# Patient Record
Sex: Female | Born: 1950 | Race: White | Hispanic: No | Marital: Married | Smoking: Never smoker
Health system: Southern US, Community
[De-identification: ages and names within clinical notes are randomized; demographics above are authoritative.]

## PROBLEM LIST (undated history)

## (undated) DIAGNOSIS — D649 Anemia, unspecified: Secondary | ICD-10-CM

## (undated) DIAGNOSIS — M797 Fibromyalgia: Secondary | ICD-10-CM

## (undated) DIAGNOSIS — K219 Gastro-esophageal reflux disease without esophagitis: Secondary | ICD-10-CM

## (undated) DIAGNOSIS — F329 Major depressive disorder, single episode, unspecified: Secondary | ICD-10-CM

## (undated) DIAGNOSIS — K589 Irritable bowel syndrome without diarrhea: Secondary | ICD-10-CM

## (undated) DIAGNOSIS — M8580 Other specified disorders of bone density and structure, unspecified site: Secondary | ICD-10-CM

## (undated) DIAGNOSIS — R51 Headache: Secondary | ICD-10-CM

## (undated) DIAGNOSIS — T7840XA Allergy, unspecified, initial encounter: Secondary | ICD-10-CM

## (undated) DIAGNOSIS — R519 Headache, unspecified: Secondary | ICD-10-CM

## (undated) DIAGNOSIS — G8929 Other chronic pain: Secondary | ICD-10-CM

## (undated) DIAGNOSIS — J329 Chronic sinusitis, unspecified: Secondary | ICD-10-CM

## (undated) DIAGNOSIS — G709 Myoneural disorder, unspecified: Secondary | ICD-10-CM

## (undated) DIAGNOSIS — Q430 Meckel's diverticulum (displaced) (hypertrophic): Secondary | ICD-10-CM

## (undated) DIAGNOSIS — F32A Depression, unspecified: Secondary | ICD-10-CM

## (undated) DIAGNOSIS — F419 Anxiety disorder, unspecified: Secondary | ICD-10-CM

## (undated) HISTORY — PX: BREAST ENHANCEMENT SURGERY: SHX7

## (undated) HISTORY — DX: Anemia, unspecified: D64.9

## (undated) HISTORY — DX: Major depressive disorder, single episode, unspecified: F32.9

## (undated) HISTORY — PX: HEMORRHOID BANDING: SHX5850

## (undated) HISTORY — DX: Gastro-esophageal reflux disease without esophagitis: K21.9

## (undated) HISTORY — DX: Other specified disorders of bone density and structure, unspecified site: M85.80

## (undated) HISTORY — DX: Myoneural disorder, unspecified: G70.9

## (undated) HISTORY — DX: Irritable bowel syndrome, unspecified: K58.9

## (undated) HISTORY — DX: Meckel's diverticulum (displaced) (hypertrophic): Q43.0

## (undated) HISTORY — DX: Allergy, unspecified, initial encounter: T78.40XA

## (undated) HISTORY — PX: LAPAROSCOPIC ABDOMINAL EXPLORATION: SHX6249

## (undated) HISTORY — DX: Fibromyalgia: M79.7

## (undated) HISTORY — DX: Other chronic pain: G89.29

## (undated) HISTORY — DX: Anxiety disorder, unspecified: F41.9

## (undated) HISTORY — DX: Headache: R51

## (undated) HISTORY — PX: ABDOMINAL HYSTERECTOMY: SHX81

## (undated) HISTORY — DX: Headache, unspecified: R51.9

## (undated) HISTORY — DX: Chronic sinusitis, unspecified: J32.9

## (undated) HISTORY — DX: Depression, unspecified: F32.A

---

## 1995-06-16 DIAGNOSIS — Q430 Meckel's diverticulum (displaced) (hypertrophic): Secondary | ICD-10-CM

## 1995-06-16 HISTORY — PX: APPENDECTOMY: SHX54

## 1995-06-16 HISTORY — DX: Meckel's diverticulum (displaced) (hypertrophic): Q43.0

## 1998-03-13 ENCOUNTER — Emergency Department (HOSPITAL_COMMUNITY): Admission: EM | Admit: 1998-03-13 | Discharge: 1998-03-13 | Payer: Self-pay | Admitting: Emergency Medicine

## 1998-03-14 ENCOUNTER — Emergency Department (HOSPITAL_COMMUNITY): Admission: EM | Admit: 1998-03-14 | Discharge: 1998-03-15 | Payer: Self-pay | Admitting: Emergency Medicine

## 1998-04-10 ENCOUNTER — Encounter: Payer: Self-pay | Admitting: Internal Medicine

## 1998-04-10 ENCOUNTER — Ambulatory Visit (HOSPITAL_COMMUNITY): Admission: RE | Admit: 1998-04-10 | Discharge: 1998-04-10 | Payer: Self-pay | Admitting: Internal Medicine

## 1999-03-17 ENCOUNTER — Other Ambulatory Visit: Admission: RE | Admit: 1999-03-17 | Discharge: 1999-03-17 | Payer: Self-pay | Admitting: Gynecology

## 2000-04-14 ENCOUNTER — Other Ambulatory Visit: Admission: RE | Admit: 2000-04-14 | Discharge: 2000-04-14 | Payer: Self-pay | Admitting: Gynecology

## 2000-10-04 ENCOUNTER — Ambulatory Visit (HOSPITAL_COMMUNITY): Admission: RE | Admit: 2000-10-04 | Discharge: 2000-10-04 | Payer: Self-pay | Admitting: Internal Medicine

## 2000-10-04 ENCOUNTER — Encounter: Payer: Self-pay | Admitting: Internal Medicine

## 2001-04-28 ENCOUNTER — Other Ambulatory Visit: Admission: RE | Admit: 2001-04-28 | Discharge: 2001-04-28 | Payer: Self-pay | Admitting: Gynecology

## 2002-05-04 ENCOUNTER — Other Ambulatory Visit: Admission: RE | Admit: 2002-05-04 | Discharge: 2002-05-04 | Payer: Self-pay | Admitting: Gynecology

## 2002-10-31 ENCOUNTER — Encounter: Payer: Self-pay | Admitting: Internal Medicine

## 2002-10-31 ENCOUNTER — Ambulatory Visit (HOSPITAL_COMMUNITY): Admission: RE | Admit: 2002-10-31 | Discharge: 2002-10-31 | Payer: Self-pay | Admitting: Internal Medicine

## 2003-05-28 ENCOUNTER — Other Ambulatory Visit: Admission: RE | Admit: 2003-05-28 | Discharge: 2003-05-28 | Payer: Self-pay | Admitting: Gynecology

## 2004-08-11 ENCOUNTER — Other Ambulatory Visit: Admission: RE | Admit: 2004-08-11 | Discharge: 2004-08-11 | Payer: Self-pay | Admitting: Gynecology

## 2005-03-11 ENCOUNTER — Ambulatory Visit: Payer: Self-pay | Admitting: Internal Medicine

## 2005-03-27 ENCOUNTER — Ambulatory Visit: Payer: Self-pay | Admitting: Internal Medicine

## 2005-08-11 ENCOUNTER — Other Ambulatory Visit: Admission: RE | Admit: 2005-08-11 | Discharge: 2005-08-11 | Payer: Self-pay | Admitting: Gynecology

## 2006-09-18 ENCOUNTER — Ambulatory Visit (HOSPITAL_COMMUNITY): Admission: RE | Admit: 2006-09-18 | Discharge: 2006-09-18 | Payer: Self-pay | Admitting: Internal Medicine

## 2006-10-31 ENCOUNTER — Emergency Department (HOSPITAL_COMMUNITY): Admission: EM | Admit: 2006-10-31 | Discharge: 2006-10-31 | Payer: Self-pay | Admitting: Emergency Medicine

## 2006-11-03 ENCOUNTER — Encounter (HOSPITAL_COMMUNITY): Admission: RE | Admit: 2006-11-03 | Discharge: 2007-01-24 | Payer: Self-pay | Admitting: Emergency Medicine

## 2006-11-07 ENCOUNTER — Emergency Department (HOSPITAL_COMMUNITY): Admission: EM | Admit: 2006-11-07 | Discharge: 2006-11-07 | Payer: Self-pay | Admitting: Emergency Medicine

## 2006-11-14 ENCOUNTER — Emergency Department (HOSPITAL_COMMUNITY): Admission: EM | Admit: 2006-11-14 | Discharge: 2006-11-14 | Payer: Self-pay | Admitting: Emergency Medicine

## 2006-11-21 ENCOUNTER — Emergency Department (HOSPITAL_COMMUNITY): Admission: EM | Admit: 2006-11-21 | Discharge: 2006-11-21 | Payer: Self-pay | Admitting: Emergency Medicine

## 2007-03-24 ENCOUNTER — Other Ambulatory Visit: Admission: RE | Admit: 2007-03-24 | Discharge: 2007-03-24 | Payer: Self-pay | Admitting: Gynecology

## 2007-12-07 ENCOUNTER — Ambulatory Visit (HOSPITAL_COMMUNITY): Admission: RE | Admit: 2007-12-07 | Discharge: 2007-12-07 | Payer: Self-pay | Admitting: Internal Medicine

## 2008-01-04 ENCOUNTER — Ambulatory Visit: Payer: Self-pay

## 2008-07-04 ENCOUNTER — Other Ambulatory Visit: Admission: RE | Admit: 2008-07-04 | Discharge: 2008-07-04 | Payer: Self-pay | Admitting: Gynecology

## 2009-05-02 ENCOUNTER — Encounter: Admission: RE | Admit: 2009-05-02 | Discharge: 2009-05-02 | Payer: Self-pay | Admitting: Internal Medicine

## 2009-05-23 ENCOUNTER — Ambulatory Visit (HOSPITAL_COMMUNITY): Admission: RE | Admit: 2009-05-23 | Discharge: 2009-05-23 | Payer: Self-pay | Admitting: Internal Medicine

## 2009-11-25 ENCOUNTER — Encounter: Payer: Self-pay | Admitting: Cardiology

## 2009-11-26 ENCOUNTER — Encounter: Payer: Self-pay | Admitting: Cardiology

## 2010-04-01 ENCOUNTER — Ambulatory Visit: Payer: Self-pay | Admitting: Cardiology

## 2010-04-01 ENCOUNTER — Encounter: Payer: Self-pay | Admitting: Cardiology

## 2010-04-01 DIAGNOSIS — R002 Palpitations: Secondary | ICD-10-CM

## 2010-05-02 ENCOUNTER — Ambulatory Visit: Payer: Self-pay | Admitting: Cardiology

## 2010-07-15 NOTE — Assessment & Plan Note (Signed)
Summary: np6/ palp/ pt has uch/.gd   Visit Type:  Follow-up Primary Provider:  Dr. Marlowe Shores  CC:  Chest pain/Palpitations.  History of Present Illness: The patient presents evaluation of chest discomfort. This has been sporadic. She finds it difficult to quantify or qualify. Seems to happen at rest. It seems to be in mild discomfort. There is no associated jaw or arm discomfort or associated symptoms. She cannot bring on with walking. She also gets palpitations. These she described as skipped heartbeats or a tightness in her throat. This again happened sporadically. Seems to be less if she is taking Xanax. She has had some lightheadedness with them but no syncope. She does have some shortness of breath but has seasonal allergies and wheezing. She also reports to being under significant emotional stress. This seems to calm her symptoms. She has had a stress test for 5 years ago and described a perfusion study that apparently was normal. She's had no other cardiac history  Current Medications (verified): 1)  Savella 50 Mg Tabs (Milnacipran Hcl) .Marland Kitchen.. 1 By Mouth Two Times A Day 2)  Amitiza 24 Mcg Caps (Lubiprostone) .Marland Kitchen.. 1 By Mouth Two Times A Day 3)  Hydrochlorothiazide 25 Mg Tabs (Hydrochlorothiazide) .Marland Kitchen.. 1 By Mouth As Needed 4)  Aspirin 81 Mg  Tabs (Aspirin) .Marland Kitchen.. 1 By Mouth Daily 5)  Singulair 10 Mg Tabs (Montelukast Sodium) .Marland Kitchen.. 1 By Mouth Daily 6)  Docusate Sodium 100 Mg Caps (Docusate Sodium) .... As Needed 7)  Zantac 150 Mg Tabs (Ranitidine Hcl) .Marland Kitchen.. 1 By Mouth Two Times A Day 8)  Vitamin B Complex  Tabs (B Complex Vitamins) .Marland Kitchen.. 1 By Mouth Daily 9)  Vitamin D3 5000 Unit Tabs (Cholecalciferol) .Marland Kitchen.. 1 By Mouth Daily 10)  Magnesium 250 Mg Tabs (Magnesium) .Marland Kitchen.. 1 By Mouth  Two Times A Day 11)  Fish Oil   Oil (Fish Oil) .... 2 By Mouth Daily 12)  Relpax 40 Mg Tabs (Eletriptan Hydrobromide) .Marland Kitchen.. 1 By Mouth As Needed 13)  Zofran 8 Mg Tabs (Ondansetron Hcl) .... As Needed 14)  Vivelle-Dot  0.075 Mg/24hr Pttw (Estradiol) .... As Directed 15)  Advair Diskus 250-50 Mcg/dose Aepb (Fluticasone-Salmeterol) .... Two Times A Day As Needed 16)  Prednisone 5 Mg Tabs (Prednisone) .... As Needed 17)  Alprazolam 0.25 Mg Tabs (Alprazolam) .... 1/2 As Needed 18)  Aleve 220 Mg Tabs (Naproxen Sodium) .... As Needed 19)  Oxvert 12.5mg  .... 1 By Mouth As Needed 20)  Omraris .... As Needed  Allergies (verified): 1)  ! * Codiene 2)  ! Macrobid 3)  ! Vicodin  Past History:  Past Medical History: Seasonal allergies, depression, migraine headache   Past Surgical History: Caesarean section, hysterectomy, laparoscopy, breast lift w/implants, paracentesis   Review of Systems       As stated in the HPI and negative for all other systems.   Vital Signs:  Patient profile:   60 year old female Height:      60 inches Weight:      117 pounds BMI:     22.93 Pulse rate:   90 / minute Resp:     16 per minute BP sitting:   145 / 89  (right arm)  Vitals Entered By: Marrion Coy, CNA (April 01, 2010 9:05 AM)  Physical Exam  General:  Well developed, well nourished, in no acute distress. Head:  normocephalic and atraumatic Eyes:  PERRLA/EOM intact; conjunctiva and lids normal. Mouth:  Teeth, gums and palate normal. Oral mucosa normal. Neck:  Neck supple, no JVD. No masses, thyromegaly or abnormal cervical nodes. Chest Wall:  no deformities or breast masses noted Lungs:  Clear bilaterally to auscultation and percussion. Abdomen:  Bowel sounds positive; abdomen soft and non-tender without masses, organomegaly, or hernias noted. No hepatosplenomegaly. Msk:  Back normal, normal gait. Muscle strength and tone normal. Extremities:  No clubbing or cyanosis. Neurologic:  Alert and oriented x 3. Skin:  Intact without lesions or rashes. Cervical Nodes:  no significant adenopathy Axillary Nodes:  no significant adenopathy Inguinal Nodes:  no significant adenopathy Psych:  Normal  affect.   Detailed Cardiovascular Exam  Neck    Carotids: Carotids full and equal bilaterally without bruits.      Neck Veins: Normal, no JVD.    Heart    Inspection: no deformities or lifts noted.      Palpation: normal PMI with no thrills palpable.      Auscultation: regular rate and rhythm, S1, S2 without murmurs, rubs, gallops, or clicks.    Vascular    Abdominal Aorta: no palpable masses, pulsations, or audible bruits.      Femoral Pulses: normal femoral pulses bilaterally.      Pedal Pulses: normal pedal pulses bilaterally.      Radial Pulses: normal radial pulses bilaterally.      Peripheral Circulation: no clubbing, cyanosis, or edema noted with normal capillary refill.     Impression & Recommendations:  Problem # 1:  PALPITATIONS (ICD-785.1) Patient has symptoms as above. However, I strongly believe these to be stress related and infrequent enough that Holter monitoring wouldn't be helpful. She is going to pursue stress management first. I will check to make sure that there have been thyroid studies, potassium and magnesium. If the palpitations get worse we could consider further evaluation. Orders: Treadmill (Treadmill)  Problem # 2:  PRECORDIAL PAIN (QIO-962.95) Date the pretest probability of obstructive coronary disease is low. Exercise treadmill testing is indicated in this situation. Orders: EKG w/ Interpretation (93000) Treadmill (Treadmill)  Problem # 3:  ANXIETY (ICD-300.00) I have encouraged her to take treatment as prescribed for this and to followup with her primary physician.  Patient Instructions: 1)  Your physician recommends that you schedule a follow-up appointment at the time of your treadmill 2)  Your physician recommends that you continue on your current medications as directed. Please refer to the Current Medication list given to you today. 3)  Your physician has requested that you have an exercise tolerance test.  For further information please  visit [email or web address]  Please also follow instruction sheet, as given.

## 2010-07-15 NOTE — Letter (Signed)
Summary: [facility]  [facility]   Imported By: Marylou Mccoy 05/07/2010 09:37:19  _____________________________________________________________________  External Attachment:    Type:   Image     Comment:   External Document

## 2011-02-26 ENCOUNTER — Emergency Department (HOSPITAL_BASED_OUTPATIENT_CLINIC_OR_DEPARTMENT_OTHER)
Admission: EM | Admit: 2011-02-26 | Discharge: 2011-02-26 | Disposition: A | Discharge status: Home / Self Care | Payer: 59 | Attending: Emergency Medicine | Admitting: Emergency Medicine

## 2011-02-26 ENCOUNTER — Encounter: Payer: Self-pay | Admitting: *Deleted

## 2011-02-26 DIAGNOSIS — W268XXA Contact with other sharp object(s), not elsewhere classified, initial encounter: Secondary | ICD-10-CM | POA: Insufficient documentation

## 2011-02-26 DIAGNOSIS — S61209A Unspecified open wound of unspecified finger without damage to nail, initial encounter: Secondary | ICD-10-CM | POA: Insufficient documentation

## 2011-02-26 DIAGNOSIS — S61012A Laceration without foreign body of left thumb without damage to nail, initial encounter: Secondary | ICD-10-CM

## 2011-02-26 DIAGNOSIS — Z79899 Other long term (current) drug therapy: Secondary | ICD-10-CM | POA: Insufficient documentation

## 2011-02-26 DIAGNOSIS — Y92009 Unspecified place in unspecified non-institutional (private) residence as the place of occurrence of the external cause: Secondary | ICD-10-CM | POA: Insufficient documentation

## 2011-02-26 DIAGNOSIS — J45909 Unspecified asthma, uncomplicated: Secondary | ICD-10-CM | POA: Insufficient documentation

## 2011-02-26 MED ORDER — LIDOCAINE HCL (PF) 1 % IJ SOLN
5.0000 mL | Freq: Once | INTRAMUSCULAR | Status: AC
  Administered 2011-02-26: 5 mL

## 2011-02-26 MED ORDER — LIDOCAINE HCL (PF) 1 % IJ SOLN
INTRAMUSCULAR | Status: AC
  Filled 2011-02-26: qty 5

## 2011-02-26 MED ORDER — TETANUS-DIPHTH-ACELL PERTUSSIS 5-2.5-18.5 LF-MCG/0.5 IM SUSP
0.5000 mL | Freq: Once | INTRAMUSCULAR | Status: AC
  Administered 2011-02-26: 0.5 mL via INTRAMUSCULAR
  Filled 2011-02-26: qty 0.5

## 2011-02-26 NOTE — ED Notes (Signed)
Pt cut her left thumb against a metal can. Bleeding controlled. Unsure when last tetanus was given.

## 2011-02-26 NOTE — ED Provider Notes (Signed)
History     CSN: [id number] Arrival date & time: 02/26/2011  8:41 PM   Chief Complaint  Patient presents with  . Extremity Laceration     (Include location/radiation/quality/duration/timing/severity/associated sxs/prior treatment) Patient is a 60 y.o. female presenting with hand pain. The history is provided by the patient. No language interpreter was used.  Hand Pain This is a new problem. The current episode started today. The problem occurs constantly. The problem has been unchanged. The symptoms are aggravated by nothing. She has tried nothing for the symptoms. The treatment provided no relief.  Pt complains of a laceration to left thumb.  Pt cut thumb over a dog food can   Past Medical History  Diagnosis Date  . Asthma      Past Surgical History  Procedure Date  . Abdominal hysterectomy   . Cesarian   . Breast enhancement surgery     No family history on file.  History  Substance Use Topics  . Smoking status: Never Smoker   . Smokeless tobacco: Not on file  . Alcohol Use: No    OB History    Grav Para Term Preterm Abortions TAB SAB Ect Mult Living                  Review of Systems  Skin: Positive for wound.  All other systems reviewed and are negative.    Allergies  Hydrocodone-acetaminophen; Codeine; Morphine and related; and Nitrofurantoin  Home Medications   Current Outpatient Rx  Name Route Sig Dispense Refill  . ACETAMINOPHEN 500 MG PO TABS Oral Take 1,000 mg by mouth every 6 (six) hours as needed. For pain     . ALMOTRIPTAN MALATE 12.5 MG PO TABS Oral Take 12.5 mg by mouth once as needed. may repeat in 2 hours if needed     . ASPIRIN 81 MG PO TABS Oral Take 81 mg by mouth daily.      . CYCLOSPORINE 0.05 % OP EMUL Both Eyes Place 1 drop into both eyes 2 (two) times daily.      Marland Kitchen ESTRADIOL 0.05 MG/24HR TD PTTW Transdermal Place 1 patch onto the skin 2 (two) times a week.      Marland Kitchen FLUTICASONE-SALMETEROL 250-50 MCG/DOSE IN AEPB Inhalation Inhale 1  puff into the lungs 2 (two) times daily as needed. For shortness of breath     . HYDROCHLOROTHIAZIDE 25 MG PO TABS Oral Take 25 mg by mouth daily as needed. For fluid     . LEVOFLOXACIN 500 MG PO TABS Oral Take 500 mg by mouth daily.      . LUBIPROSTONE 24 MCG PO CAPS Oral Take 24 mcg by mouth 2 (two) times daily.      Marland Kitchen METRONIDAZOLE 1 % EX GEL Topical Apply 1 application topically 2 (two) times daily.      Marland Kitchen MILNACIPRAN HCL 100 MG PO TABS Oral Take 100 mg by mouth daily.      Marland Kitchen MONTELUKAST SODIUM 10 MG PO TABS Oral Take 10 mg by mouth daily.      Marland Kitchen RANITIDINE HCL 75 MG PO TABS Oral Take 75 mg by mouth 2 (two) times daily.      . ALBUTEROL 90 MCG/ACT IN AERS Inhalation Inhale 1 puff into the lungs every 6 (six) hours as needed. For shortness of breath       Physical Exam    BP 155/84  Pulse 88  Temp(Src) 97.8 F (36.6 C) (Oral)  Resp 20  SpO2 100%  Physical  Exam  Constitutional: She is oriented to person, place, and time. She appears well-developed and well-nourished.  HENT:  Head: Normocephalic.  Eyes: Pupils are equal, round, and reactive to light.  Musculoskeletal: She exhibits tenderness. She exhibits no edema.       1 cm laceration   Neurological: She is alert and oriented to person, place, and time.  Skin: Skin is warm.  Psychiatric: She has a normal mood and affect.    ED Course  LACERATION REPAIR Date/Time: 02/26/2011 10:36 PM Performed by: Langston Masker Authorized by: Emeline General A Risks and benefits: risks, benefits and alternatives were discussed Consent given by: patient Patient identity confirmed: verbally with patient Time out: Immediately prior to procedure a "time out" was called to verify the correct patient, procedure, equipment, support staff and site/side marked as required. Body area: upper extremity Location details: left thumb Laceration length: 0.7 cm Foreign bodies: metal Tendon involvement: none Nerve involvement: none Vascular damage:  no Anesthesia: local infiltration Local anesthetic: lidocaine 2% without epinephrine Patient sedated: no Skin closure: 5-0 Prolene Number of sutures: 2 Technique: simple Approximation: close Approximation difficulty: simple Patient tolerance: Patient tolerated the procedure well with no immediate complications.    No results found for this or any previous visit. No results found.   No diagnosis found.   MDM Pt advised suture removal in 8 days.       Langston Masker, [place] 02/26/11 2239  Langston Masker, [place] 02/26/11 2241

## 2011-03-02 NOTE — ED Provider Notes (Signed)
Medical screening examination/treatment/procedure(s) were performed by non-physician practitioner and as supervising physician I was immediately available for consultation/collaboration.   Dezyre Hoefer A Burgundy Matuszak, MD 03/02/11 0814 

## 2011-05-14 ENCOUNTER — Other Ambulatory Visit: Payer: Self-pay | Admitting: Internal Medicine

## 2011-05-14 DIAGNOSIS — R1011 Right upper quadrant pain: Secondary | ICD-10-CM

## 2011-05-19 ENCOUNTER — Ambulatory Visit
Admission: RE | Admit: 2011-05-19 | Discharge: 2011-05-19 | Disposition: A | Discharge status: Home / Self Care | Payer: 59 | Source: Ambulatory Visit | Attending: Internal Medicine | Admitting: Internal Medicine

## 2011-05-19 DIAGNOSIS — R1011 Right upper quadrant pain: Secondary | ICD-10-CM

## 2011-12-24 ENCOUNTER — Other Ambulatory Visit: Payer: Self-pay | Admitting: Internal Medicine

## 2011-12-24 ENCOUNTER — Ambulatory Visit
Admission: RE | Admit: 2011-12-24 | Discharge: 2011-12-24 | Disposition: A | Discharge status: Home / Self Care | Payer: 59 | Source: Ambulatory Visit | Attending: Internal Medicine | Admitting: Internal Medicine

## 2011-12-24 DIAGNOSIS — N200 Calculus of kidney: Secondary | ICD-10-CM

## 2012-03-11 ENCOUNTER — Other Ambulatory Visit (HOSPITAL_COMMUNITY): Payer: Self-pay | Admitting: Internal Medicine

## 2012-03-11 ENCOUNTER — Ambulatory Visit (HOSPITAL_COMMUNITY)
Admission: RE | Admit: 2012-03-11 | Discharge: 2012-03-11 | Disposition: A | Discharge status: Home / Self Care | Payer: 59 | Source: Ambulatory Visit | Attending: Internal Medicine | Admitting: Internal Medicine

## 2012-03-11 DIAGNOSIS — M549 Dorsalgia, unspecified: Secondary | ICD-10-CM | POA: Insufficient documentation

## 2012-03-11 DIAGNOSIS — R209 Unspecified disturbances of skin sensation: Secondary | ICD-10-CM | POA: Insufficient documentation

## 2012-03-11 DIAGNOSIS — R200 Anesthesia of skin: Secondary | ICD-10-CM

## 2013-03-07 LAB — HM MAMMOGRAPHY: HM Mammogram: NORMAL

## 2013-04-21 ENCOUNTER — Telehealth: Payer: Self-pay | Admitting: *Deleted

## 2013-04-21 ENCOUNTER — Other Ambulatory Visit: Payer: Self-pay | Admitting: Internal Medicine

## 2013-04-21 MED ORDER — ALPRAZOLAM 0.25 MG PO TABS: 0.2500 mg | ORAL_TABLET | Freq: Three times a day (TID) | ORAL | Status: DC | PRN

## 2013-04-21 NOTE — Telephone Encounter (Signed)
Refill fax request [facility] [address]  [place] [address]  [phone] ph [phone] fax    Xanax 0.25mg    #90

## 2013-05-03 ENCOUNTER — Encounter: Payer: Self-pay | Admitting: Internal Medicine

## 2013-05-03 DIAGNOSIS — J329 Chronic sinusitis, unspecified: Secondary | ICD-10-CM | POA: Insufficient documentation

## 2013-05-03 DIAGNOSIS — F419 Anxiety disorder, unspecified: Secondary | ICD-10-CM | POA: Insufficient documentation

## 2013-05-03 DIAGNOSIS — F324 Major depressive disorder, single episode, in partial remission: Secondary | ICD-10-CM | POA: Insufficient documentation

## 2013-05-03 DIAGNOSIS — J45909 Unspecified asthma, uncomplicated: Secondary | ICD-10-CM | POA: Insufficient documentation

## 2013-05-03 DIAGNOSIS — M8580 Other specified disorders of bone density and structure, unspecified site: Secondary | ICD-10-CM | POA: Insufficient documentation

## 2013-05-04 ENCOUNTER — Encounter: Payer: Self-pay | Admitting: Physician Assistant

## 2013-05-04 ENCOUNTER — Ambulatory Visit: Payer: 59 | Admitting: Physician Assistant

## 2013-05-04 VITALS — BP 128/70 | HR 84 | Temp 98.0°F | Resp 16 | Ht 60.0 in | Wt 109.0 lb

## 2013-05-04 DIAGNOSIS — J019 Acute sinusitis, unspecified: Secondary | ICD-10-CM

## 2013-05-04 MED ORDER — PREDNISONE 20 MG PO TABS: ORAL_TABLET | ORAL | Status: DC

## 2013-05-04 MED ORDER — AZITHROMYCIN 250 MG PO TABS: ORAL_TABLET | ORAL | Status: DC

## 2013-05-04 MED ORDER — AZELASTINE HCL 0.1 % NA SOLN: 2.0000 | Freq: Two times a day (BID) | NASAL | Status: DC

## 2013-05-04 NOTE — Progress Notes (Signed)
Subjective:    Patient ID: Alexis Weaver, female    DOB: [birth year], 62 y.o.   MRN: [id number]  Sinus Problem This is a new problem. Episode onset: 6 weeks worse for 3 weeks. The problem has been gradually worsening since onset. There has been no fever. The pain is mild. Associated symptoms include congestion, coughing, sinus pressure and a sore throat. Pertinent negatives include no chills, diaphoresis, headaches, neck pain, shortness of breath or sneezing.   In addition patient has fallen while on rocks on left hip. States it is better but has pain with lying on it. Patient also has right lateral elbow pain, chronic.   Current Outpatient Prescriptions on File Prior to Visit  Medication Sig Dispense Refill  . acetaminophen (TYLENOL) 500 MG tablet Take 1,000 mg by mouth every 6 (six) hours as needed. For pain       . ALPRAZolam (XANAX) 0.25 MG tablet Take 1 tablet (0.25 mg total) by mouth 3 (three) times daily as needed for anxiety.  90 tablet  5  . aspirin 81 MG tablet Take 81 mg by mouth daily.        . cycloSPORINE (RESTASIS) 0.05 % ophthalmic emulsion Place 1 drop into both eyes 2 (two) times daily.        Marland Kitchen estradiol (VIVELLE-DOT) 0.05 MG/24HR Place 1 patch onto the skin 2 (two) times a week.        . Fluticasone-Salmeterol (ADVAIR) 250-50 MCG/DOSE AEPB Inhale 1 puff into the lungs 2 (two) times daily as needed. For shortness of breath       . hydrochlorothiazide (HYDRODIURIL) 25 MG tablet Take 25 mg by mouth daily as needed. For fluid       . lubiprostone (AMITIZA) 24 MCG capsule Take 24 mcg by mouth 2 (two) times daily.        . Milnacipran HCl (SAVELLA) 100 MG TABS Take 100 mg by mouth daily.        . montelukast (SINGULAIR) 10 MG tablet Take 10 mg by mouth daily.         No current facility-administered medications on file prior to visit.   Past Medical History  Diagnosis Date  . Asthma   . Chronic sinusitis   . Anxiety   . Depression   . Osteopenia     Review of Systems   Constitutional: Positive for fatigue. Negative for fever, chills, diaphoresis, activity change, appetite change and unexpected weight change.  HENT: Positive for congestion, postnasal drip, sinus pressure and sore throat. Negative for sneezing.   Eyes: Negative.   Respiratory: Positive for cough and chest tightness. Negative for apnea, choking, shortness of breath, wheezing and stridor.   Endocrine: Negative.   Genitourinary: Negative.   Musculoskeletal: Positive for arthralgias and joint swelling (right elbow). Negative for back pain, gait problem, myalgias, neck pain and neck stiffness.  Skin: Negative.   Neurological: Negative.  Negative for headaches.  Hematological: Negative.        Objective:   Physical Exam  Constitutional: She is oriented to person, place, and time. She appears well-developed and well-nourished.  HENT:  Head: Normocephalic and atraumatic.  Nose: Right sinus exhibits maxillary sinus tenderness. Left sinus exhibits maxillary sinus tenderness.  Eyes: Conjunctivae are normal. Pupils are equal, round, and reactive to light.  Neck: Normal range of motion. Neck supple.  Cardiovascular: Normal rate, regular rhythm and normal heart sounds.   Pulmonary/Chest: Effort normal and breath sounds normal. No respiratory distress. She has no wheezes. She has  no rales.  Abdominal: Soft. Bowel sounds are normal.  Musculoskeletal:       Right elbow: She exhibits swelling. She exhibits normal range of motion, no effusion, no deformity and no laceration. Tenderness found. Lateral epicondyle tenderness noted.       Lumbar back: She exhibits tenderness and pain. She exhibits normal range of motion, no bony tenderness, no swelling, no edema, no deformity, no laceration and no spasm.  Lymphadenopathy:    She has cervical adenopathy.  Neurological: She is alert and oriented to person, place, and time.  Skin: Skin is warm and dry.       Assessment & Plan:  1. Acute sinusitis,  unspecified - azithromycin (ZITHROMAX) 250 MG tablet; Two tablets day one, then one tablet daily next 4 days.  Dispense: 6 tablet; Refill: 1 - predniSONE (DELTASONE) 20 MG tablet; take one tablet two times daily with food for 3 days, take one tablet daily for 4 days.  Dispense: 10 tablet; Refill: 0 - azelastine (ASTELIN) 137 MCG/SPRAY nasal spray; Place 2 sprays into both nostrils 2 (two) times daily. Use in each nostril as directed  Dispense: 30 mL; Refill: 2  2. Elbow pain and hip pain Get on prednisone and if pain continues then follow in the office for possible injections/xray.

## 2013-05-25 ENCOUNTER — Telehealth: Payer: Self-pay

## 2013-05-25 ENCOUNTER — Other Ambulatory Visit: Payer: Self-pay | Admitting: Physician Assistant

## 2013-05-25 MED ORDER — LEVOFLOXACIN 500 MG PO TABS: 500.0000 mg | ORAL_TABLET | Freq: Every day | ORAL | Status: AC

## 2013-05-25 MED ORDER — FLUCONAZOLE 150 MG PO TABS: 150.0000 mg | ORAL_TABLET | Freq: Every day | ORAL | Status: DC

## 2013-05-25 NOTE — Telephone Encounter (Signed)
Patient aware new RX to pharmacy

## 2013-06-02 ENCOUNTER — Other Ambulatory Visit: Payer: Self-pay | Admitting: Internal Medicine

## 2013-06-05 DIAGNOSIS — Z0000 Encounter for general adult medical examination without abnormal findings: Secondary | ICD-10-CM

## 2013-06-06 ENCOUNTER — Other Ambulatory Visit: Payer: Self-pay | Admitting: Internal Medicine

## 2013-06-12 ENCOUNTER — Other Ambulatory Visit: Payer: Self-pay | Admitting: Internal Medicine

## 2013-07-24 ENCOUNTER — Other Ambulatory Visit: Payer: Self-pay | Admitting: Allergy

## 2013-07-24 ENCOUNTER — Ambulatory Visit
Admission: RE | Admit: 2013-07-24 | Discharge: 2013-07-24 | Disposition: A | Discharge status: Home / Self Care | Payer: 59 | Source: Ambulatory Visit | Attending: Allergy | Admitting: Allergy

## 2013-07-24 DIAGNOSIS — J45909 Unspecified asthma, uncomplicated: Secondary | ICD-10-CM

## 2013-07-24 DIAGNOSIS — J329 Chronic sinusitis, unspecified: Secondary | ICD-10-CM

## 2013-08-21 ENCOUNTER — Other Ambulatory Visit: Payer: Self-pay | Admitting: Emergency Medicine

## 2013-08-21 MED ORDER — MILNACIPRAN HCL 100 MG PO TABS: 100.0000 mg | ORAL_TABLET | Freq: Every day | ORAL | Status: DC

## 2013-09-05 ENCOUNTER — Other Ambulatory Visit: Payer: Self-pay | Admitting: Emergency Medicine

## 2013-09-05 MED ORDER — ONDANSETRON HCL 8 MG PO TABS: 8.0000 mg | ORAL_TABLET | Freq: Three times a day (TID) | ORAL | Status: DC | PRN

## 2013-11-19 ENCOUNTER — Other Ambulatory Visit: Payer: Self-pay | Admitting: Internal Medicine

## 2013-11-22 ENCOUNTER — Other Ambulatory Visit: Payer: Self-pay | Admitting: Physician Assistant

## 2013-11-22 ENCOUNTER — Other Ambulatory Visit: Payer: Self-pay | Admitting: Internal Medicine

## 2013-11-22 ENCOUNTER — Other Ambulatory Visit: Payer: Self-pay | Admitting: Emergency Medicine

## 2013-11-22 MED ORDER — ALPRAZOLAM 0.25 MG PO TABS: ORAL_TABLET | ORAL | Status: DC

## 2013-11-23 ENCOUNTER — Encounter: Payer: Self-pay | Admitting: Physician Assistant

## 2013-11-23 ENCOUNTER — Ambulatory Visit (INDEPENDENT_AMBULATORY_CARE_PROVIDER_SITE_OTHER): Payer: 59 | Admitting: Physician Assistant

## 2013-11-23 VITALS — BP 110/62 | HR 76 | Temp 98.1°F | Resp 16 | Wt 107.0 lb

## 2013-11-23 DIAGNOSIS — IMO0001 Reserved for inherently not codable concepts without codable children: Secondary | ICD-10-CM

## 2013-11-23 DIAGNOSIS — E538 Deficiency of other specified B group vitamins: Secondary | ICD-10-CM

## 2013-11-23 DIAGNOSIS — Z79899 Other long term (current) drug therapy: Secondary | ICD-10-CM

## 2013-11-23 DIAGNOSIS — R5383 Other fatigue: Secondary | ICD-10-CM

## 2013-11-23 DIAGNOSIS — N3000 Acute cystitis without hematuria: Secondary | ICD-10-CM

## 2013-11-23 DIAGNOSIS — R5381 Other malaise: Secondary | ICD-10-CM

## 2013-11-23 LAB — URINALYSIS, ROUTINE W REFLEX MICROSCOPIC
Bilirubin Urine: NEGATIVE
GLUCOSE, UA: NEGATIVE mg/dL
HGB URINE DIPSTICK: NEGATIVE
KETONES UR: NEGATIVE mg/dL
Leukocytes, UA: NEGATIVE
Nitrite: NEGATIVE
PH: 6.5 (ref 5.0–8.0)
PROTEIN: NEGATIVE mg/dL
Specific Gravity, Urine: 1.006 (ref 1.005–1.030)
Urobilinogen, UA: 0.2 mg/dL (ref 0.0–1.0)

## 2013-11-23 LAB — CBC WITH DIFFERENTIAL/PLATELET
BASOS PCT: 1 % (ref 0–1)
Basophils Absolute: 0 10*3/uL (ref 0.0–0.1)
EOS PCT: 0 % (ref 0–5)
Eosinophils Absolute: 0 10*3/uL (ref 0.0–0.7)
HEMATOCRIT: 37.8 % (ref 36.0–46.0)
Hemoglobin: 13.2 g/dL (ref 12.0–15.0)
Lymphocytes Relative: 32 % (ref 12–46)
Lymphs Abs: 1.5 10*3/uL (ref 0.7–4.0)
MCH: 31.1 pg (ref 26.0–34.0)
MCHC: 34.9 g/dL (ref 30.0–36.0)
MCV: 89.2 fL (ref 78.0–100.0)
MONO ABS: 0.5 10*3/uL (ref 0.1–1.0)
Monocytes Relative: 10 % (ref 3–12)
NEUTROS ABS: 2.7 10*3/uL (ref 1.7–7.7)
Neutrophils Relative %: 57 % (ref 43–77)
Platelets: 243 10*3/uL (ref 150–400)
RBC: 4.24 MIL/uL (ref 3.87–5.11)
RDW: 14.7 % (ref 11.5–15.5)
WBC: 4.8 10*3/uL (ref 4.0–10.5)

## 2013-11-23 LAB — MAGNESIUM: MAGNESIUM: 1.5 mg/dL (ref 1.5–2.5)

## 2013-11-23 LAB — CK: CK TOTAL: 80 U/L (ref 7–177)

## 2013-11-23 LAB — COMPREHENSIVE METABOLIC PANEL
ALK PHOS: 58 U/L (ref 39–117)
ALT: 27 U/L (ref 0–35)
AST: 31 U/L (ref 0–37)
Albumin: 4.1 g/dL (ref 3.5–5.2)
BUN: 11 mg/dL (ref 6–23)
CO2: 30 mEq/L (ref 19–32)
CREATININE: 0.74 mg/dL (ref 0.50–1.10)
Calcium: 9.1 mg/dL (ref 8.4–10.5)
Chloride: 98 mEq/L (ref 96–112)
Glucose, Bld: 94 mg/dL (ref 70–99)
POTASSIUM: 4 meq/L (ref 3.5–5.3)
Sodium: 136 mEq/L (ref 135–145)
Total Bilirubin: 0.3 mg/dL (ref 0.2–1.2)
Total Protein: 6.5 g/dL (ref 6.0–8.3)

## 2013-11-23 LAB — VITAMIN B12: Vitamin B-12: 505 pg/mL (ref 211–911)

## 2013-11-23 LAB — TSH: TSH: 2.565 u[IU]/mL (ref 0.350–4.500)

## 2013-11-23 LAB — SEDIMENTATION RATE: Sed Rate: 4 mm/hr (ref 0–22)

## 2013-11-23 MED ORDER — CIPROFLOXACIN HCL 250 MG PO TABS: 250.0000 mg | ORAL_TABLET | Freq: Two times a day (BID) | ORAL | Status: AC

## 2013-11-23 MED ORDER — SULFAMETHOXAZOLE-TMP DS 800-160 MG PO TABS: 1.0000 | ORAL_TABLET | Freq: Two times a day (BID) | ORAL | Status: DC

## 2013-11-23 MED ORDER — PREDNISONE 20 MG PO TABS: ORAL_TABLET | ORAL | Status: DC

## 2013-11-23 MED ORDER — AZITHROMYCIN 250 MG PO TABS: ORAL_TABLET | ORAL | Status: DC

## 2013-11-23 NOTE — Patient Instructions (Addendum)
If you are traveling you can take these medications to be more prepared. If you get chest pain, shortness of breath or abdominal pain please go to the hospital wherever you may be.   Ciprofloxacin is good for travelers diarrhea, you can take 2 pills a day for 7 days. Or it is also good for urinary tract infections, you can take 2 a day for 7 days.  Zpak- is good for sinus infections please finish as prescribed Phenergran is for nausea but it sedating so plan on eating and sleeping.   Levsin is good for nausea, diarrhea, or abdominal cramping- it can constipate you so don't take too much. This dissolves under your tongue.  Prednisone is good for joint pain or rashes or spider bites- you can take it as prescribed but if you are feeling better you can stop it early.   Urinary Tract Infection Urinary tract infections (UTIs) can develop anywhere along your urinary tract. Your urinary tract is your body's drainage system for removing wastes and extra water. Your urinary tract includes two kidneys, two ureters, a bladder, and a urethra. Your kidneys are a pair of bean-shaped organs. Each kidney is about the size of your fist. They are located below your ribs, one on each side of your spine. CAUSES Infections are caused by microbes, which are microscopic organisms, including fungi, viruses, and bacteria. These organisms are so small that they can only be seen through a microscope. Bacteria are the microbes that most commonly cause UTIs. SYMPTOMS  Symptoms of UTIs may vary by age and gender of the patient and by the location of the infection. Symptoms in young women typically include a frequent and intense urge to urinate and a painful, burning feeling in the bladder or urethra during urination. Older women and men are more likely to be tired, shaky, and weak and have muscle aches and abdominal pain. A fever may mean the infection is in your kidneys. Other symptoms of a kidney infection include pain in your  back or sides below the ribs, nausea, and vomiting. DIAGNOSIS To diagnose a UTI, your caregiver will ask you about your symptoms. Your caregiver also will ask to provide a urine sample. The urine sample will be tested for bacteria and white blood cells. White blood cells are made by your body to help fight infection. TREATMENT  Typically, UTIs can be treated with medication. Because most UTIs are caused by a bacterial infection, they usually can be treated with the use of antibiotics. The choice of antibiotic and length of treatment depend on your symptoms and the type of bacteria causing your infection. HOME CARE INSTRUCTIONS  If you were prescribed antibiotics, take them exactly as your caregiver instructs you. Finish the medication even if you feel better after you have only taken some of the medication.  Drink enough water and fluids to keep your urine clear or pale yellow.  Avoid caffeine, tea, and carbonated beverages. They tend to irritate your bladder.  Empty your bladder often. Avoid holding urine for long periods of time.  Empty your bladder before and after sexual intercourse.  After a bowel movement, women should cleanse from front to back. Use each tissue only once. SEEK MEDICAL CARE IF:   You have back pain.  You develop a fever.  Your symptoms do not begin to resolve within 3 days. SEEK IMMEDIATE MEDICAL CARE IF:   You have severe back pain or lower abdominal pain.  You develop chills.  You have nausea or  vomiting.  You have continued burning or discomfort with urination. MAKE SURE YOU:   Understand these instructions.  Will watch your condition.  Will get help right away if you are not doing well or get worse. Document Released: 03/11/2005 Document Revised: 12/01/2011 Document Reviewed: 07/10/2011 [facility] Patient Information 2014 [employer], [place].

## 2013-11-23 NOTE — Progress Notes (Signed)
   Subjective:    Patient ID: Alexis Weaver, female    DOB: 1951-06-01, 63 y.o.   MRN: [id number]  HPI 63 y.o. female with history of fibromyalgia presents with fatigue last 2 weeks, she has had a sore throat and started her allergy medications. She has had some urgency, lower back pain in the past 2-3 days. She has been having more aching in her legs.    Review of Systems  Constitutional: Positive for fatigue. Negative for fever, chills and appetite change.  HENT: Positive for postnasal drip, sinus pressure and sore throat. Negative for dental problem, drooling, ear discharge, ear pain, sneezing, tinnitus and trouble swallowing.   Respiratory: Negative.   Cardiovascular: Negative.   Gastrointestinal: Negative.   Genitourinary: Positive for dysuria, urgency and frequency.  Musculoskeletal: Positive for arthralgias, back pain and myalgias.  Skin: Negative.  Negative for pallor and rash.  Neurological: Negative.        Objective:   Physical Exam  Constitutional: She is oriented to person, place, and time. She appears well-developed and well-nourished.  HENT:  Head: Normocephalic and atraumatic.  Right Ear: External ear normal.  Left Ear: External ear normal.  Mouth/Throat: Oropharynx is clear and moist.  Eyes: Conjunctivae and EOM are normal. Pupils are equal, round, and reactive to light.  Neck: Normal range of motion. Neck supple. No thyromegaly present.  Cardiovascular: Normal rate, regular rhythm and normal heart sounds.  Exam reveals no gallop and no friction rub.   No murmur heard. Pulmonary/Chest: Effort normal and breath sounds normal. No respiratory distress. She has no wheezes.  Abdominal: Soft. Bowel sounds are normal. She exhibits no distension and no mass. There is no tenderness. There is no rebound and no guarding.  Musculoskeletal: Normal range of motion.  Lymphadenopathy:    She has no cervical adenopathy.  Neurological: She is alert and oriented to person,  place, and time. She displays normal reflexes. No cranial nerve deficit. Coordination normal.  Skin: Skin is warm and dry.  Psychiatric: She has a normal mood and affect.       Assessment & Plan:  1. Acute cystitis Bactrim DS BID for 7 days.  - Urinalysis, Routine w reflex microscopic - Urine culture  2. Myalgia and myositis - CK - Sedimentation rate  3. Fatigue - CBC with Differential - Comprehensive metabolic panel - TSH - Sedimentation rate  4. Encounter for long-term (current) use of other medications - Magnesium  5. B12 deficiency - Vitamin B12  Follow up at [facility] in August

## 2013-11-24 LAB — URINE CULTURE: Colony Count: 3000

## 2013-12-05 ENCOUNTER — Other Ambulatory Visit: Payer: Self-pay | Admitting: Emergency Medicine

## 2013-12-05 MED ORDER — LUBIPROSTONE 24 MCG PO CAPS: 24.0000 ug | ORAL_CAPSULE | Freq: Two times a day (BID) | ORAL | Status: DC

## 2013-12-19 ENCOUNTER — Encounter: Payer: Self-pay | Admitting: Emergency Medicine

## 2013-12-19 ENCOUNTER — Ambulatory Visit (INDEPENDENT_AMBULATORY_CARE_PROVIDER_SITE_OTHER): Payer: 59 | Admitting: Emergency Medicine

## 2013-12-19 VITALS — BP 120/66 | HR 96 | Temp 97.8°F | Resp 18 | Ht 60.0 in | Wt 107.0 lb

## 2013-12-19 DIAGNOSIS — G43909 Migraine, unspecified, not intractable, without status migrainosus: Secondary | ICD-10-CM

## 2013-12-19 DIAGNOSIS — N898 Other specified noninflammatory disorders of vagina: Secondary | ICD-10-CM

## 2013-12-19 DIAGNOSIS — M2550 Pain in unspecified joint: Secondary | ICD-10-CM

## 2013-12-19 DIAGNOSIS — N9489 Other specified conditions associated with female genital organs and menstrual cycle: Secondary | ICD-10-CM

## 2013-12-19 DIAGNOSIS — R6889 Other general symptoms and signs: Secondary | ICD-10-CM

## 2013-12-19 MED ORDER — PREDNISONE 10 MG PO TABS: 10.0000 mg | ORAL_TABLET | ORAL | Status: DC | PRN

## 2013-12-19 MED ORDER — BIMATOPROST 0.03 % EX SOLN: CUTANEOUS | Status: DC

## 2013-12-19 MED ORDER — VERAPAMIL HCL 80 MG PO TABS: 80.0000 mg | ORAL_TABLET | Freq: Three times a day (TID) | ORAL | Status: DC

## 2013-12-19 MED ORDER — ESTRADIOL 0.05 MG/24HR TD PTTW: 1.0000 | MEDICATED_PATCH | TRANSDERMAL | Status: DC

## 2013-12-19 NOTE — Patient Instructions (Signed)
Recurrent Migraine Headache °A migraine headache is very bad, throbbing pain on one or both sides of your head. Recurrent migraines keep coming back. Talk to your doctor about what things may bring on (trigger) your migraine headaches. °HOME CARE °· Only take medicines as told by your doctor. °· Lie down in a dark, quiet room when you have a migraine. °· Keep a journal to find out if certain things bring on migraine headaches. For example, write down: °¨ What you eat and drink. °¨ How much sleep you get. °¨ Any change to your diet or medicines. °· Lessen how much alcohol you drink. °· Quit smoking if you smoke. °· Get enough sleep. °· Lessen any stress in your life. °· Keep lights dim if bright lights bother you or make your migraines worse. °GET HELP IF: °· Medicine does not help your migraines. °· Your pain keeps coming back. °· You have a fever. °GET HELP RIGHT AWAY IF:  °· Your migraine becomes really bad. °· You have a stiff neck. °· You have trouble seeing. °· Your muscles are weak, or you lose muscle control. °· You lose your balance or have trouble walking. °· You feel like you will pass out (faint), or you pass out. °· You have really bad symptoms that are different than your first symptoms. °MAKE SURE YOU:  °· Understand these instructions. °· Will watch your condition. °· Will get help right away if you are not doing well or get worse. °Document Released: 03/10/2008 Document Revised: 06/06/2013 Document Reviewed: 02/06/2013 °Exit[employer]® Patient Information ©2015 [employer]. This information is not intended to replace advice given to you by your health care provider. Make sure you discuss any questions you have with your health care provider. ° °

## 2013-12-19 NOTE — Progress Notes (Signed)
Subjective:    Patient ID: Alexis Weaver, female    DOB: 06/29/1950, 63 y.o.   MRN: [id number]  HPI Comments: 63 yo WF for OV with medicine concerns. She has chronic migraines and uses 10 mg Prednisone PRN to help with migraines at last resort. She notes + relief with Prednisone but rarely uses. She uses Axert/ Relpax/ Zofran for PRN Migraines. She notes she has less migraines since retiring. She notes most headaches occur with weather fronts.   She only uses 1 Vivelle dot 1 x week. She has note at night she has to urinate more. She has only 1 tea before 7 pm. She notes mild vaginal dryness. She has restarted having intercourse with mild discomfort but notes lubrication helps.  She notes eyelashes thinning and wants to try Latisse  She has had white spot on back of right throat for over 1 year without any change. She denies any ST symptoms.  She has chronic fatigue and hand arthritis. She notes sister has psoriatic arthritis. She has had multiple NEG autoimmune labs in past.     Medication List       This list is accurate as of: 12/19/13 11:28 AM.  Always use your most recent med list.               ALPRAZolam 0.25 MG tablet  Commonly known as:  XANAX  take 1 tablet by mouth three times a day if needed for anxiety     aspirin 81 MG tablet  Take 81 mg by mouth daily.     AXERT 12.5 MG tablet  Generic drug:  almotriptan  TAKE 1 TABLET NOW FOR MIGRAINE. MAY REPEAT DOSE . MAXIMUM OF 2 TABS IN 24 HOURS     cycloSPORINE 0.05 % ophthalmic emulsion  Commonly known as:  RESTASIS  Place 1 drop into both eyes 2 (two) times daily.     estradiol 0.05 MG/24HR patch  Commonly known as:  VIVELLE-DOT  Place 1 patch (0.05 mg total) onto the skin 2 (two) times a week.     fluticasone 50 MCG/ACT nasal spray  Commonly known as:  FLONASE  Place 2 sprays into both nostrils daily.     Fluticasone-Salmeterol 250-50 MCG/DOSE Aepb  Commonly known as:  ADVAIR  Inhale 1 puff into the lungs 2  (two) times daily as needed. For shortness of breath     loratadine-pseudoephedrine 10-240 MG per 24 hr tablet  Commonly known as:  CLARITIN-D 24-hour  Take 1 tablet by mouth daily.     lubiprostone 24 MCG capsule  Commonly known as:  AMITIZA  Take 1 capsule (24 mcg total) by mouth 2 (two) times daily.     Milnacipran HCl 100 MG Tabs tablet  Commonly known as:  SAVELLA  Take 1 tablet (100 mg total) by mouth daily.     montelukast 10 MG tablet  Commonly known as:  SINGULAIR  Take 10 mg by mouth daily.     NEXIUM 40 MG capsule  Generic drug:  esomeprazole  TAKE ONE CAPSULE EVERY DAY     ondansetron 8 MG tablet  Commonly known as:  ZOFRAN  Take 1 tablet (8 mg total) by mouth every 8 (eight) hours as needed for nausea or vomiting.     predniSONE 10 MG tablet  Commonly known as:  DELTASONE  Take 1 tablet (10 mg total) by mouth as needed.     RELPAX 40 MG tablet  Generic drug:  eletriptan  take 1  tablet by mouth immediately May repeat one time after 2 hours  (NO MORE THAN 2 TABLETS IN 24 HOURS)     triamterene-hydrochlorothiazide 75-50 MG per tablet  Commonly known as:  MAXZIDE  take 1 tablet by mouth prn  fluid retention       Allergies  Allergen Reactions  . Hydrocodone-Acetaminophen Shortness Of Breath  . Codeine Hives  . Morphine And Related Hives  . Nitrofurantoin Other (See Comments)    Makes hands and feet burn   Past Medical History  Diagnosis Date  . Asthma   . Chronic sinusitis   . Anxiety   . Depression   . Osteopenia       Review of Systems  Genitourinary: Positive for vaginal pain.  Musculoskeletal: Positive for arthralgias.  Neurological: Positive for headaches.  All other systems reviewed and are negative.  BP 120/66  Pulse 96  Temp(Src) 97.8 F (36.6 C) (Temporal)  Resp 18  Ht 5' (1.524 m)  Wt 107 lb (48.535 kg)  BMI 20.90 kg/m2     Objective:   Physical Exam  Nursing note and vitals reviewed. Constitutional: She is oriented to  person, place, and time. She appears well-developed and well-nourished. No distress.  HENT:  Head: Normocephalic and atraumatic.  Right Ear: External ear normal.  Left Ear: External ear normal.  Nose: Nose normal.  Mouth/Throat: Oropharynx is clear and moist.  Posterior pharynx with white patch right side  Eyes: Conjunctivae and EOM are normal.  Neck: Normal range of motion. Neck supple. No JVD present. No thyromegaly present.  Cardiovascular: Normal rate, regular rhythm, normal heart sounds and intact distal pulses.   Pulmonary/Chest: Effort normal and breath sounds normal.  Abdominal: Soft. Bowel sounds are normal. She exhibits no distension. There is no tenderness. There is no rebound.  Genitourinary:  deef gyn  Musculoskeletal: Normal range of motion. She exhibits tenderness. She exhibits no edema.  Mildly large knuckles compared to hand overall size  Lymphadenopathy:    She has no cervical adenopathy.  Neurological: She is alert and oriented to person, place, and time. No cranial nerve deficit.  Skin: Skin is warm and dry. No rash noted. No erythema. No pallor.  Psychiatric: She has a normal mood and affect. Her behavior is normal. Judgment and thought content normal.          Assessment & Plan:  1. Vaginal dryness/ ? OAB vs Cystocele- [place], OV with GYN to evaluate  2. Migraines chronic- Trial of Verapamil 80 mg start 1 QD x 1 week then increase to BID if still with migraine.   3. ? Post pharynx change- REF ENT  4. ? Autoimmune- ref RHEUM  OVER 40 minutes of exam, counseling, chart review, referral performed

## 2014-01-15 ENCOUNTER — Encounter: Payer: Self-pay | Admitting: Physician Assistant

## 2014-01-15 ENCOUNTER — Encounter: Payer: Self-pay | Admitting: Emergency Medicine

## 2014-01-15 ENCOUNTER — Ambulatory Visit (INDEPENDENT_AMBULATORY_CARE_PROVIDER_SITE_OTHER): Payer: 59 | Admitting: Physician Assistant

## 2014-01-15 VITALS — BP 130/78 | HR 72 | Temp 97.7°F | Resp 16 | Ht 60.5 in | Wt 105.0 lb

## 2014-01-15 DIAGNOSIS — Z0000 Encounter for general adult medical examination without abnormal findings: Secondary | ICD-10-CM

## 2014-01-15 LAB — CBC WITH DIFFERENTIAL/PLATELET
BASOS ABS: 0.1 10*3/uL (ref 0.0–0.1)
Basophils Relative: 2 % — ABNORMAL HIGH (ref 0–1)
Eosinophils Absolute: 0.1 10*3/uL (ref 0.0–0.7)
Eosinophils Relative: 2 % (ref 0–5)
HCT: 39.6 % (ref 36.0–46.0)
Hemoglobin: 13.1 g/dL (ref 12.0–15.0)
LYMPHS ABS: 1.6 10*3/uL (ref 0.7–4.0)
LYMPHS PCT: 43 % (ref 12–46)
MCH: 30.1 pg (ref 26.0–34.0)
MCHC: 33.1 g/dL (ref 30.0–36.0)
MCV: 91 fL (ref 78.0–100.0)
Monocytes Absolute: 0.3 10*3/uL (ref 0.1–1.0)
Monocytes Relative: 8 % (ref 3–12)
NEUTROS PCT: 45 % (ref 43–77)
Neutro Abs: 1.7 10*3/uL (ref 1.7–7.7)
PLATELETS: 272 10*3/uL (ref 150–400)
RBC: 4.35 MIL/uL (ref 3.87–5.11)
RDW: 14.8 % (ref 11.5–15.5)
WBC: 3.7 10*3/uL — AB (ref 4.0–10.5)

## 2014-01-15 NOTE — Patient Instructions (Signed)

## 2014-01-15 NOTE — Progress Notes (Signed)
Complete Physical  Assessment and Plan: Asthma- seeing Dr. Barnwell Callas  Chronic sinusitis- monitor, continue meds  Anxiety- continue counseling/meds  Depression- continue counseling/meds  Osteopenia-DEXA  Constipation- add benefiber, and continue amitza  Discussed med's effects and SE's. Screening labs and tests as requested with regular follow-up as recommended.  HPI 63 y.o. female  presents for a complete physical.  Her blood pressure has been controlled at home, today their BP is BP: 130/78 mmHg She does workout. She denies chest pain, shortness of breath, dizziness.  She is not on cholesterol medication and denies myalgias. Her cholesterol is at goal. The cholesterol last visit was:  LDL 82 Last A1C in the office was: 5.4 Patient is on Vitamin D supplement.56 She has had a negative ANA, antiDNA, RF.    She is on estrogen therapy and on bASA. Had a very good vacation with her husabnd but she is very stressed due to her son relapsing, taking care of her brother and her father in law.  She sees a therapist every other week.  Seeing Dr. Ezzard Standing Has not started the verapamil for palpitations.   Current Medications:  Current Outpatient Prescriptions on File Prior to Visit  Medication Sig Dispense Refill  . ALPRAZolam (XANAX) 0.25 MG tablet take 1 tablet by mouth three times a day if needed for anxiety  90 tablet  1  . aspirin 81 MG tablet Take 81 mg by mouth daily.        . AXERT 12.5 MG tablet TAKE 1 TABLET NOW FOR MIGRAINE. MAY REPEAT DOSE . MAXIMUM OF 2 TABS IN 24 HOURS  12 tablet  2  . bimatoprost (LATISSE) 0.03 % ophthalmic solution Place one drop on applicator and apply evenly along the skin of the upper eyelid at base of eyelashes once daily at bedtime; repeat procedure for second eye (use a clean applicator).  3 mL  3  . cycloSPORINE (RESTASIS) 0.05 % ophthalmic emulsion Place 1 drop into both eyes 2 (two) times daily.        Marland Kitchen estradiol (VIVELLE-DOT) 0.05 MG/24HR patch Place 1  patch (0.05 mg total) onto the skin 2 (two) times a week.  8 patch  2  . fluticasone (FLONASE) 50 MCG/ACT nasal spray Place 2 sprays into both nostrils daily.      . Fluticasone-Salmeterol (ADVAIR) 250-50 MCG/DOSE AEPB Inhale 1 puff into the lungs 2 (two) times daily as needed. For shortness of breath       . loratadine-pseudoephedrine (CLARITIN-D 24-HOUR) 10-240 MG per 24 hr tablet Take 1 tablet by mouth daily.      Marland Kitchen lubiprostone (AMITIZA) 24 MCG capsule Take 1 capsule (24 mcg total) by mouth 2 (two) times daily.  60 capsule  3  . Milnacipran HCl (SAVELLA) 100 MG TABS tablet Take 1 tablet (100 mg total) by mouth daily.  60 tablet  3  . montelukast (SINGULAIR) 10 MG tablet Take 10 mg by mouth daily.        Marland Kitchen NEXIUM 40 MG capsule TAKE ONE CAPSULE EVERY DAY  30 capsule  11  . ondansetron (ZOFRAN) 8 MG tablet Take 1 tablet (8 mg total) by mouth every 8 (eight) hours as needed for nausea or vomiting.  100 tablet  0  . predniSONE (DELTASONE) 10 MG tablet Take 1 tablet (10 mg total) by mouth as needed.  90 tablet  1  . RELPAX 40 MG tablet take 1 tablet by mouth immediately May repeat one time after 2 hours  (NO MORE THAN  2 TABLETS IN 24 HOURS)  6 tablet  1  . triamterene-hydrochlorothiazide (MAXZIDE) 75-50 MG per tablet take 1 tablet by mouth prn  fluid retention      . verapamil (CALAN) 80 MG tablet Take 1 tablet (80 mg total) by mouth 3 (three) times daily.  90 tablet  1   No current facility-administered medications on file prior to visit.   Health Maintenance:   Immunization History  Administered Date(s) Administered  . DT 11/06/2004  . Influenza-Unspecified 02/14/2012  . Tdap 02/26/2011   Tetanus: 2012 Pneumovax: Flu vaccine: 2013 Zostavax: Pap: 2012  MGM: 02/2013 DEXA: 09/2010 Colonoscopy: 2006 due 2016 EGD:  Allergies:  Allergies  Allergen Reactions  . Hydrocodone-Acetaminophen Shortness Of Breath  . Codeine Hives  . Morphine And Related Hives  . Nitrofurantoin Other (See  Comments)    Makes hands and feet burn   Medical History:  Past Medical History  Diagnosis Date  . Asthma   . Chronic sinusitis   . Anxiety   . Depression   . Osteopenia    Surgical History:  Past Surgical History  Procedure Laterality Date  . Abdominal hysterectomy    . Cesarian    . Breast enhancement surgery     Family History: No family history on file. Social History:  History  Substance Use Topics  . Smoking status: Never Smoker   . Smokeless tobacco: Not on file  . Alcohol Use: No    Review of Systems: [X]  = complains of  [ ]  = denies  General: Fatigue [ ]  Fever [ ]  Chills [ ]  Weakness [ ]   Insomnia [ ] Weight change [ ]  Night sweats [ ]   Change in appetite [ ]  Eyes: Redness [ ]  Blurred vision [ ]  Diplopia [ ]  Discharge [ ]   ENT: Congestion [ ]  Sinus Pain [ ]  Post Nasal Drip [ ]  Sore Throat [ ]  Earache [ ]  hearing loss [ ]  Tinnitus [ ]  Snoring [ ]   Cardiac: Chest pain/pressure [ ]  SOB [ ]  Orthopnea [ ]   Palpitations [x ]  Paroxysmal nocturnal dyspnea[ ]  Claudication [ ]  Edema [ ]   Pulmonary: Cough [ ]  Wheezing[ ]   SOB [ ]   Pleurisy [ ]   GI: Nausea [ ]  Vomiting[ ]  Dysphagia[ ]  Heartburn[x ] Abdominal pain [ ]  Constipation [ x]; Diarrhea [ ]  BRBPR [ ]  Melena[ ]  Bloating [ ]  Hemorrhoids [ ]   GU: Hematuria[ ]  Dysuria [ ]  Nocturia[ ]  Urgency [ ]   Hesitancy [ ]  Discharge [ ]  Frequency [ ]   Breast:  Breast lumps [ ]   nipple discharge [ ]    Neuro: Headaches[ ]  Vertigo[ ]  Paresthesias[ ]  Spasm [ ]  Speech changes [ ]  Incoordination [ ]   Ortho: Arthritis [x ] Joint pain [ ]  Muscle pain [ ]  Joint swelling [ ]  Back Pain [ ]  Skin:  Rash [ ]   Pruritis [ ]  Change in skin lesion [ ]   Psych: Depression[x ] Anxiety[x ] Confusion [ ]  Memory loss [ ]   Heme/Lypmh: Bleeding [ ]  Bruising [ ]  Enlarged lymph nodes [ ]   Endocrine: Visual blurring [ ]  Paresthesia [ ]  Polyuria [ ]  Polydypsea [ ]    Heat/cold intolerance [ ]  Hypoglycemia [ ]   Physical Exam: Estimated body mass index is 20.16  kg/(m^2) as calculated from the following:   Height as of this encounter: 5' 0.5" (1.537 m).   Weight as of this encounter: 105 lb (47.628 kg). BP 130/78  Pulse 72  Temp(Src) 97.7 F (36.5 C)  Resp 16  Ht 5' 0.5" (1.537 m)  Wt 105 lb (47.628 kg)  BMI 20.16 kg/m2 General Appearance: Well nourished, in no apparent distress. Eyes: PERRLA, EOMs, conjunctiva no swelling or erythema, normal fundi and vessels. Sinuses: No Frontal/maxillary tenderness ENT/Mouth: Ext aud canals clear, normal light reflex with TMs without erythema, bulging.  Good dentition. No erythema, swelling, or exudate on post pharynx. Tonsils not swollen or erythematous. Hearing normal.  Neck: Supple, thyroid normal. No bruits Respiratory: Respiratory effort normal, BS equal bilaterally without rales, rhonchi, wheezing or stridor. Cardio: RRR without murmurs, rubs or gallops. Brisk peripheral pulses without edema.  Chest: symmetric, with normal excursions and percussion. Breasts: defer  Abdomen: Soft, +BS. Non tender, no guarding, rebound, hernias, masses, or organomegaly. .  Lymphatics: Non tender without lymphadenopathy.  Genitourinary: defer Musculoskeletal: Full ROM all peripheral extremities,5/5 strength, and normal gait. Skin: Warm, dry without rashes, lesions, ecchymosis.  Neuro: Cranial nerves intact, reflexes equal bilaterally. Normal muscle tone, no cerebellar symptoms. Sensation intact.  Psych: Awake and oriented X 3, normal affect, Insight and Judgment appropriate.   EKG: WNL no changes.   Quentin Mulling 9:20 AM

## 2014-01-16 ENCOUNTER — Other Ambulatory Visit: Payer: Self-pay | Admitting: Physician Assistant

## 2014-01-16 LAB — URINALYSIS, ROUTINE W REFLEX MICROSCOPIC
GLUCOSE, UA: NEGATIVE mg/dL
HGB URINE DIPSTICK: NEGATIVE
Ketones, ur: NEGATIVE mg/dL
LEUKOCYTES UA: NEGATIVE
NITRITE: NEGATIVE
Protein, ur: NEGATIVE mg/dL
SPECIFIC GRAVITY, URINE: 1.023 (ref 1.005–1.030)
Urobilinogen, UA: 1 mg/dL (ref 0.0–1.0)
pH: 7.5 (ref 5.0–8.0)

## 2014-01-16 LAB — BASIC METABOLIC PANEL WITH GFR
BUN: 11 mg/dL (ref 6–23)
CHLORIDE: 100 meq/L (ref 96–112)
CO2: 28 meq/L (ref 19–32)
Calcium: 9 mg/dL (ref 8.4–10.5)
Creat: 0.74 mg/dL (ref 0.50–1.10)
GFR, EST NON AFRICAN AMERICAN: 87 mL/min
GFR, Est African American: >89 mL/min
Glucose, Bld: 93 mg/dL (ref 70–99)
Potassium: 4.3 mEq/L (ref 3.5–5.3)
Sodium: 138 mEq/L (ref 135–145)

## 2014-01-16 LAB — LIPID PANEL
CHOL/HDL RATIO: 2.1 ratio
Cholesterol: 193 mg/dL (ref 0–200)
HDL: 91 mg/dL (ref >39)
LDL Cholesterol: 92 mg/dL (ref 0–99)
TRIGLYCERIDES: 48 mg/dL (ref <150)
VLDL: 10 mg/dL (ref 0–40)

## 2014-01-16 LAB — HEPATIC FUNCTION PANEL
ALK PHOS: 59 U/L (ref 39–117)
ALT: 18 U/L (ref 0–35)
AST: 25 U/L (ref 0–37)
Albumin: 4.3 g/dL (ref 3.5–5.2)
BILIRUBIN DIRECT: 0.1 mg/dL (ref 0.0–0.3)
BILIRUBIN TOTAL: 0.7 mg/dL (ref 0.2–1.2)
Indirect Bilirubin: 0.6 mg/dL (ref 0.2–1.2)
Total Protein: 6.4 g/dL (ref 6.0–8.3)

## 2014-01-16 LAB — IRON AND TIBC
%SAT: 25 % (ref 20–55)
Iron: 95 ug/dL (ref 42–145)
TIBC: 379 ug/dL (ref 250–470)
UIBC: 284 ug/dL (ref 125–400)

## 2014-01-16 LAB — MICROALBUMIN / CREATININE URINE RATIO
CREATININE, URINE: 188.8 mg/dL
MICROALB UR: 0.75 mg/dL (ref 0.00–1.89)
Microalb Creat Ratio: 4 mg/g (ref 0.0–30.0)

## 2014-01-16 LAB — VITAMIN B12: VITAMIN B 12: 435 pg/mL (ref 211–911)

## 2014-01-16 LAB — TSH: TSH: 1.405 u[IU]/mL (ref 0.350–4.500)

## 2014-01-16 LAB — HEMOGLOBIN A1C
HEMOGLOBIN A1C: 5.9 % — AB (ref <5.7)
Mean Plasma Glucose: 123 mg/dL — ABNORMAL HIGH (ref <117)

## 2014-01-16 LAB — VITAMIN D 25 HYDROXY (VIT D DEFICIENCY, FRACTURES): Vit D, 25-Hydroxy: 79 ng/mL (ref 30–89)

## 2014-01-16 LAB — MAGNESIUM: Magnesium: 1.9 mg/dL (ref 1.5–2.5)

## 2014-01-16 LAB — INSULIN, FASTING: Insulin fasting, serum: 5 u[IU]/mL (ref 3–28)

## 2014-01-16 LAB — FERRITIN: FERRITIN: 11 ng/mL (ref 10–291)

## 2014-01-18 ENCOUNTER — Encounter: Payer: Self-pay | Admitting: Physician Assistant

## 2014-02-23 ENCOUNTER — Other Ambulatory Visit: Payer: Self-pay | Admitting: *Deleted

## 2014-02-23 MED ORDER — VERAPAMIL HCL 80 MG PO TABS: 80.0000 mg | ORAL_TABLET | Freq: Three times a day (TID) | ORAL | Status: DC

## 2014-04-10 ENCOUNTER — Other Ambulatory Visit: Payer: Self-pay | Admitting: *Deleted

## 2014-04-10 MED ORDER — LUBIPROSTONE 24 MCG PO CAPS: 24.0000 ug | ORAL_CAPSULE | Freq: Two times a day (BID) | ORAL | Status: DC

## 2014-05-01 ENCOUNTER — Other Ambulatory Visit: Payer: Self-pay | Admitting: Internal Medicine

## 2014-05-01 MED ORDER — MILNACIPRAN HCL 100 MG PO TABS: ORAL_TABLET | ORAL | Status: DC

## 2014-06-04 ENCOUNTER — Other Ambulatory Visit: Payer: Self-pay

## 2014-06-04 MED ORDER — ESTRADIOL 0.05 MG/24HR TD PTTW: 1.0000 | MEDICATED_PATCH | TRANSDERMAL | Status: DC

## 2014-06-15 DIAGNOSIS — D649 Anemia, unspecified: Secondary | ICD-10-CM

## 2014-06-15 HISTORY — DX: Anemia, unspecified: D64.9

## 2014-06-26 ENCOUNTER — Ambulatory Visit (INDEPENDENT_AMBULATORY_CARE_PROVIDER_SITE_OTHER): Payer: 59 | Admitting: Physician Assistant

## 2014-06-26 ENCOUNTER — Encounter: Payer: Self-pay | Admitting: Physician Assistant

## 2014-06-26 VITALS — BP 122/78 | HR 72 | Temp 98.2°F | Resp 18 | Ht 60.5 in | Wt 107.0 lb

## 2014-06-26 DIAGNOSIS — F419 Anxiety disorder, unspecified: Secondary | ICD-10-CM

## 2014-06-26 DIAGNOSIS — E559 Vitamin D deficiency, unspecified: Secondary | ICD-10-CM

## 2014-06-26 DIAGNOSIS — R7303 Prediabetes: Secondary | ICD-10-CM | POA: Insufficient documentation

## 2014-06-26 DIAGNOSIS — R7309 Other abnormal glucose: Secondary | ICD-10-CM | POA: Insufficient documentation

## 2014-06-26 DIAGNOSIS — M25511 Pain in right shoulder: Secondary | ICD-10-CM

## 2014-06-26 DIAGNOSIS — J320 Chronic maxillary sinusitis: Secondary | ICD-10-CM

## 2014-06-26 DIAGNOSIS — J45909 Unspecified asthma, uncomplicated: Secondary | ICD-10-CM

## 2014-06-26 DIAGNOSIS — F32A Depression, unspecified: Secondary | ICD-10-CM

## 2014-06-26 DIAGNOSIS — Z1239 Encounter for other screening for malignant neoplasm of breast: Secondary | ICD-10-CM

## 2014-06-26 DIAGNOSIS — M791 Myalgia, unspecified site: Secondary | ICD-10-CM

## 2014-06-26 DIAGNOSIS — M25512 Pain in left shoulder: Secondary | ICD-10-CM

## 2014-06-26 DIAGNOSIS — F329 Major depressive disorder, single episode, unspecified: Secondary | ICD-10-CM

## 2014-06-26 DIAGNOSIS — K589 Irritable bowel syndrome without diarrhea: Secondary | ICD-10-CM

## 2014-06-26 DIAGNOSIS — Z79899 Other long term (current) drug therapy: Secondary | ICD-10-CM | POA: Insufficient documentation

## 2014-06-26 LAB — CBC WITH DIFFERENTIAL/PLATELET
Basophils Absolute: 0 10*3/uL (ref 0.0–0.1)
Basophils Relative: 1 % (ref 0–1)
Eosinophils Absolute: 0 10*3/uL (ref 0.0–0.7)
Eosinophils Relative: 1 % (ref 0–5)
HCT: 38.9 % (ref 36.0–46.0)
HEMOGLOBIN: 12.9 g/dL (ref 12.0–15.0)
LYMPHS PCT: 35 % (ref 12–46)
Lymphs Abs: 1.6 10*3/uL (ref 0.7–4.0)
MCH: 30.1 pg (ref 26.0–34.0)
MCHC: 33.2 g/dL (ref 30.0–36.0)
MCV: 90.9 fL (ref 78.0–100.0)
MONO ABS: 0.4 10*3/uL (ref 0.1–1.0)
MONOS PCT: 9 % (ref 3–12)
MPV: 10.2 fL (ref 8.6–12.4)
Neutro Abs: 2.4 10*3/uL (ref 1.7–7.7)
Neutrophils Relative %: 54 % (ref 43–77)
Platelets: 290 10*3/uL (ref 150–400)
RBC: 4.28 MIL/uL (ref 3.87–5.11)
RDW: 14.6 % (ref 11.5–15.5)
WBC: 4.5 10*3/uL (ref 4.0–10.5)

## 2014-06-26 LAB — HEMOGLOBIN A1C
HEMOGLOBIN A1C: 5.9 % — AB (ref <5.7)
MEAN PLASMA GLUCOSE: 123 mg/dL — AB (ref <117)

## 2014-06-26 MED ORDER — MONTELUKAST SODIUM 10 MG PO TABS: 10.0000 mg | ORAL_TABLET | Freq: Every day | ORAL | Status: DC

## 2014-06-26 NOTE — Progress Notes (Signed)
Assessment and Plan:  1. Prediabetes Please follow recommended diet and exercise.  Check A1C and Insulin levels. - Hemoglobin A1c - Insulin, fasting  2. Anxiety and Depression Continue Xanax as prescribed.   Continue Savella as prescribed.  3. Asthma, chronic, unspecified asthma severity, uncomplicated Continue Singulair as prescribed- montelukast (SINGULAIR) 10 MG tablet; Take 1 tablet (10 mg total) by mouth daily.  Dispense: 30 tablet; Refill: 2 Continue Advair as prescribed.  4. Encounter for long-term (current) use of medications Will monitor kidney and liver function - CBC with Differential - BASIC METABOLIC PANEL WITH GFR - Hepatic function panel  5. Screening for breast cancer Please contact [facility] for screening mammogram - MM DIGITAL SCREENING BILATERAL; Future  6. Vitamin D deficiency Continue vitamin D as prescribed.  Check vitamin D level. - Vit D  25 hydroxy (rtn osteoporosis monitoring)  7. Myalgia Continue Magnesium as prescribed.  Check magnesium level. - Magnesium  8. IBS- Constipation Type Continue Amitiza as prescribed. Continue Benefiber daily and stool softener as needed.  9. Chronic Sinusitis/Allergies Continue Flonase and Claritin as prescribed.  10. Bilateral Shoulder Pain -Will monitor.  If pain continues, then will refer to [facility].  Reminder to go to the ER if any CP, SOB, nausea, dizziness, severe HA, changes vision/speech, left arm numbness and tingling, and jaw pain. Continue diet and meds as discussed. Further disposition pending results of labs. Discussed medication effects and SE's.  Pt agreed to treatment plan. Please keep your physical appt on 01/16/15.   HPI A Caucasian 64 y.o. female  presents for 5 month follow up with prediabetes, depression, anxiety, asthma, chronic sinusitis and vitamin D.  Last visit with labs was 01/15/14.  Sees Dr. Snelling Callas (Allergist) who did not do anything.  Dr. Fossil Callas wanted her to start on allergy shots  and she states she does not feel they are effective.  Patient is still having numbness and tingling in both arms and states the pain is located in her neck/shoulders.  She had cervical spine x-ray on 03/11/12 that showed- degenerative change (loss of normal lordotic curve with focal kyphosis at C6-C7 with mild retrolisthesis of C6-C7 measuring 2.22mm.  Marked narrowing of C6-C7 disc space and associated anterior and posterior osteophytosis.  Oblique views shows mild foraminal narrowing on the right at C6-C7 level and more significant foraminal narrowing on the left at C6-C7 level due to uncovertebral spurring.)  She states her pain is mild right now.    Her blood pressure has been controlled at home, today their BP is BP: 122/78 mmHg.  She is not on any BP medications.  She takes Maxzide 75-50mg  as needed for edema.  She does workout, walks 4 miles a day at beach and 1 mile three days a week. She denies chest pain, shortness of breath, dizziness.   She is not on cholesterol medication and denies myalgias.  She is taking Magnesium  OTC- 1 tablet daily.  Her cholesterol is at goal. The cholesterol last visit was:  Lab Results  Component Value Date   CHOL 193 01/15/2014   HDL 91 01/15/2014   LDLCALC 92 01/15/2014   TRIG 48 01/15/2014   CHOLHDL 2.1 01/15/2014   She has been working on diet and exercise for prediabetes, and denies increased appetite, polydipsia and polyuria.  Last A1C in the office was:  Lab Results  Component Value Date   HGBA1C 5.9* 01/15/2014  Had prediabetes since August 2015. Number of meals per day? 2  B- tangerine and rice  cake with peanut butter, yogurt, blueberries  L- Skips lunch- sometimes popcorn or apple  D- salad with grilled chicken, pimento beans  Beverages? Water, 1 sweet tea a day, 1 diet coke and 2 caffiene free diet sodas per day  Patient is on Vitamin D supplement. -5,000 IU daily Lab Results  Component Value Date   VD25OH 79 01/15/2014     Current  Medications:  Current Outpatient Prescriptions on File Prior to Visit  Medication Sig Dispense Refill  . ALPRAZolam (XANAX) 0.25 MG tablet take 1 tablet by mouth three times a day if needed for anxiety 90 tablet 1  . aspirin 81 MG tablet Take 81 mg by mouth daily.      . AXERT 12.5 MG tablet TAKE 1 TABLET NOW FOR MIGRAINE. MAY REPEAT DOSE . MAXIMUM OF 2 TABS IN 24 HOURS 12 tablet 2  . bimatoprost (LATISSE) 0.03 % ophthalmic solution Place one drop on applicator and apply evenly along the skin of the upper eyelid at base of eyelashes once daily at bedtime; repeat procedure for second eye (use a clean applicator). 3 mL 3  . cycloSPORINE (RESTASIS) 0.05 % ophthalmic emulsion Place 1 drop into both eyes 2 (two) times daily.      Marland Kitchen. dextroamphetamine (DEXEDRINE SPANSULE) 10 MG 24 hr capsule Take 10 mg by mouth daily.   0  . estradiol (VIVELLE-DOT) 0.05 MG/24HR patch Place 1 patch (0.05 mg total) onto the skin 2 (two) times a week. 8 patch prn  . fluticasone (FLONASE) 50 MCG/ACT nasal spray Place 2 sprays into both nostrils daily.    . Fluticasone-Salmeterol (ADVAIR) 250-50 MCG/DOSE AEPB Inhale 1 puff into the lungs 2 (two) times daily as needed. For shortness of breath     . loratadine-pseudoephedrine (CLARITIN-D 24-HOUR) 10-240 MG per 24 hr tablet Take 1 tablet by mouth daily.    Marland Kitchen. lubiprostone (AMITIZA) 24 MCG capsule Take 1 capsule (24 mcg total) by mouth 2 (two) times daily. 60 capsule 3  . Milnacipran HCl (SAVELLA) 100 MG TABS tablet Take 1 to 2 tablets daily as directed for mood & depression 60 tablet 99  . montelukast (SINGULAIR) 10 MG tablet Take 10 mg by mouth daily.      Marland Kitchen. NEXIUM 40 MG capsule TAKE ONE CAPSULE EVERY DAY 30 capsule 11  . ondansetron (ZOFRAN) 8 MG tablet Take 1 tablet (8 mg total) by mouth every 8 (eight) hours as needed for nausea or vomiting. 100 tablet 0  . RELPAX 40 MG tablet take 1 tablet by mouth immediately May repeat one time after 2 hours  (NO MORE THAN 2 TABLETS IN 24  HOURS) 6 tablet 1  . triamterene-hydrochlorothiazide (MAXZIDE) 75-50 MG per tablet take 1 tablet by mouth prn  fluid retention     No current facility-administered medications on file prior to visit.   Medical History:  Past Medical History  Diagnosis Date  . Asthma   . Chronic sinusitis   . Anxiety   . Depression   . Osteopenia    Allergies:  Allergies  Allergen Reactions  . Hydrocodone-Acetaminophen Shortness Of Breath  . Codeine Hives  . Morphine And Related Hives  . Nitrofurantoin Other (See Comments)    Makes hands and feet burn    ROS- Review of Systems  Constitutional: Negative.   HENT: Negative.   Eyes: Negative.   Respiratory: Negative.   Cardiovascular: Negative.   Gastrointestinal: Negative.   Genitourinary: Negative.   Musculoskeletal: Negative.   Skin: Negative.   Neurological:  Positive for tingling.       Except numbness and tingling in both arms.  Psychiatric/Behavioral: Negative.    Family history- Review and unchanged Social history- Review and unchanged  Physical Exam: BP 122/78 mmHg  Pulse 72  Temp(Src) 98.2 F (36.8 C) (Temporal)  Resp 18  Ht 5' 0.5" (1.537 m)  Wt 107 lb (48.535 kg)  BMI 20.55 kg/m2  SpO2 97% Wt Readings from Last 3 Encounters:  06/26/14 107 lb (48.535 kg)  01/15/14 105 lb (47.628 kg)  12/19/13 107 lb (48.535 kg)  Vital Reviewed.   General Appearance: Well nourished, in no apparent distress. Eyes: PERRLA, EOMIs, conjunctiva no swelling or erythema.  No scleral icterus. Sinuses: No Frontal/maxillary tenderness ENT/Mouth: External auditory canals clear, TMs without erythema, edema or bulging. No erythema, edema, or exudate on posterior pharynx.  Tonsils not swollen or erythematous. Hearing normal.  Neck: Supple, thyroid normal.  Respiratory: Respiratory effort normal, CTAB.  No w/r/r or stridor.  Cardio: RRR.  m/r/g. S1S2nl. Brisk peripheral pulses without edema.  Abdomen: Soft, + normal BS.  Non tender, no guarding,  rebound, hernias, masses. Lymphatics: Non tender without lymphadenopathy.  Musculoskeletal: Full ROM, 5/5 strength, normal gait.  Skin: Warm, dry intact without rashes, lesions, ecchymosis.  Neuro: Cranial nerves intact. No cerebellar symptoms. Sensation intact.  Psych: Awake and oriented X 3, normal affect, Insight and Judgment appropriate.    Jakarius Flamenco, Lise Auer, PA-C 9:43 AM [facility]

## 2014-06-26 NOTE — Patient Instructions (Addendum)
-   Please call [facility] for mammogram Please continue medications as prescribed -Please remember to take Benefiber or stool softener to help regulate stools  Please keep your physical appt in August 2016.   GIVE PT FOOD CHOICE LISTS FOR MEAL PLANNING  1)The amount of food you eat is important  -Too much can increase glucose levels and cause you to gain weight (which can  also increase glucose levels.  -Too little can decrease glucose level to unsafe levels (<70) 2)Eat meals and snacks at the same time each day to help your diabetes medication help you.  If you eat at different times each day, then the medication will not be as effective. 3)Do NOT skip meals or eat meals later than usual.  If you skip meals, then your glucose level can go low (<70).  Eating meals later than usual will not help the medication work effectively. 4) Amount of carbohydrates (carbs) per meal  -Breakfast- 30-45 grams of carbs  -Lunch and Dinner- 45-60 grams of carbs  -Snacks- 15-30 grams of carbs 5)Low fat foods have no more than 3 grams of fat per serving.  -Saturated- look for less than 1 gram per serving  -Trans Fat- look for 0 grams per serving 6)Exercise at least 120 minutes per week  Exercise Benefits:   -Lower LDL (Bad cholesterol)   -Lower blood pressure   -Increase HDL (Good cholesterol)   -Strengthen heart, lungs and muscles   -Burn calories and relieve stress   -Sleep better and help you feel better overall  To lower your risk of heart disease, limit your intake of saturated fat and trans fat as much as possible. THE GOOD WHAT IT DOES WHERE IT'S FOUND  MONOUNSATURATED FAT Lowers LDL and maybe raises HDL cholesterol Canola oil, olives, olive oil, peanuts, peanut oil, avocados, nuts  POLYUNSATURATED FAT Lowers LDL cholesterol Corn, safflower, sunflower and soybean oils, nuts, seeds  OMEGA-3 FATTY ACIDS Lowers triglycerides (blood fats) and blood pressure Salmon, mackerel, herring, sardines, flax seed,  flaxseed oil, walnuts, soybean oil  THE BAD WHAT IT DOES WHERE IT'S FOUND  SATURATED FAT Raises LDL (bad) cholesterol Butter, shortening, lard, red meat, cheese, whole milk, ice cream, coconut and palm oils  TRANS FAT Raises LDL cholesterol, lowers HDL (good) cholesterol Fried foods, some stick margarines, some cookies and crackers (look for hydrogenated fat on the ingredient list)  CHOLESTEROL FROM FOOD Too much may raise cholesterol levels Meat, poultry, seafood, eggs, milk, cheese, yogurt, butter   Plate Method (How much food of each food group) 1) Fill one half of your plate with nonstarchy vegetables: lettuce, broccoli, green beans, spinach, carrots or peppers. 2) Fill one quarter with protein: chicken, [place], fish, lean meat, eggs or tofu. 3) Fill one quarter with a nutritious carbohydrate food: brown rice, whole-wheat pasta, whole-wheat bread, peas, or corn.  Choose whole-wheat carbs for extra nutrition.  Controlling carbs helps you control your blood glucose. 4) Include a small piece of fruit at each meal, as well as 8 ounces of lowfat milk or yogurt. 5) Add 1-2 teaspoons of heart-healthy fat, such as olive or canola oil, trans fat-free margarine, avocado, nuts or seeds.

## 2014-06-27 LAB — BASIC METABOLIC PANEL WITH GFR
BUN: 16 mg/dL (ref 6–23)
CHLORIDE: 98 meq/L (ref 96–112)
CO2: 28 meq/L (ref 19–32)
CREATININE: 0.71 mg/dL (ref 0.50–1.10)
Calcium: 9 mg/dL (ref 8.4–10.5)
GFR, Est African American: >89 mL/min
Glucose, Bld: 87 mg/dL (ref 70–99)
POTASSIUM: 3.8 meq/L (ref 3.5–5.3)
Sodium: 137 mEq/L (ref 135–145)

## 2014-06-27 LAB — HEPATIC FUNCTION PANEL
ALK PHOS: 58 U/L (ref 39–117)
ALT: 19 U/L (ref 0–35)
AST: 23 U/L (ref 0–37)
Albumin: 4 g/dL (ref 3.5–5.2)
BILIRUBIN INDIRECT: 0.4 mg/dL (ref 0.2–1.2)
Bilirubin, Direct: 0.1 mg/dL (ref 0.0–0.3)
TOTAL PROTEIN: 6.4 g/dL (ref 6.0–8.3)
Total Bilirubin: 0.5 mg/dL (ref 0.2–1.2)

## 2014-06-27 LAB — INSULIN, FASTING: Insulin fasting, serum: 2.8 u[IU]/mL (ref 2.0–19.6)

## 2014-06-27 LAB — VITAMIN D 25 HYDROXY (VIT D DEFICIENCY, FRACTURES): VIT D 25 HYDROXY: 51 ng/mL (ref 30–100)

## 2014-06-27 LAB — MAGNESIUM: Magnesium: 1.7 mg/dL (ref 1.5–2.5)

## 2014-07-03 ENCOUNTER — Ambulatory Visit: Payer: Self-pay | Admitting: Physician Assistant

## 2014-07-05 ENCOUNTER — Other Ambulatory Visit: Payer: Self-pay | Admitting: Physician Assistant

## 2014-07-31 NOTE — Progress Notes (Signed)
Faxed orders to [facility]

## 2014-08-06 ENCOUNTER — Other Ambulatory Visit: Payer: Self-pay | Admitting: Internal Medicine

## 2014-08-20 ENCOUNTER — Other Ambulatory Visit: Payer: Self-pay | Admitting: Internal Medicine

## 2014-08-22 ENCOUNTER — Ambulatory Visit (INDEPENDENT_AMBULATORY_CARE_PROVIDER_SITE_OTHER): Payer: 59 | Admitting: Internal Medicine

## 2014-08-22 ENCOUNTER — Encounter: Payer: Self-pay | Admitting: Internal Medicine

## 2014-08-22 VITALS — BP 126/66 | HR 88 | Temp 98.2°F | Resp 18 | Ht 60.5 in | Wt 110.0 lb

## 2014-08-22 DIAGNOSIS — J309 Allergic rhinitis, unspecified: Secondary | ICD-10-CM

## 2014-08-22 DIAGNOSIS — J019 Acute sinusitis, unspecified: Secondary | ICD-10-CM

## 2014-08-22 MED ORDER — PREDNISONE 20 MG PO TABS: ORAL_TABLET | ORAL | Status: DC

## 2014-08-22 MED ORDER — AZITHROMYCIN 250 MG PO TABS: ORAL_TABLET | ORAL | Status: AC

## 2014-08-22 NOTE — Patient Instructions (Signed)
Sinusitis Sinusitis is redness, soreness, and inflammation of the paranasal sinuses. Paranasal sinuses are air pockets within the bones of your face (beneath the eyes, the middle of the forehead, or above the eyes). In healthy paranasal sinuses, mucus is able to drain out, and air is able to circulate through them by way of your nose. However, when your paranasal sinuses are inflamed, mucus and air can become trapped. This can allow bacteria and other germs to grow and cause infection. Sinusitis can develop quickly and last only a short time (acute) or continue over a long period (chronic). Sinusitis that lasts for more than 12 weeks is considered chronic.  CAUSES  Causes of sinusitis include:  Allergies.  Structural abnormalities, such as displacement of the cartilage that separates your nostrils (deviated septum), which can decrease the air flow through your nose and sinuses and affect sinus drainage.  Functional abnormalities, such as when the small hairs (cilia) that line your sinuses and help remove mucus do not work properly or are not present. SIGNS AND SYMPTOMS  Symptoms of acute and chronic sinusitis are the same. The primary symptoms are pain and pressure around the affected sinuses. Other symptoms include:  Upper toothache.  Earache.  Headache.  Bad breath.  Decreased sense of smell and taste.  A cough, which worsens when you are lying flat.  Fatigue.  Fever.  Thick drainage from your nose, which often is green and may contain pus (purulent).  Swelling and warmth over the affected sinuses. DIAGNOSIS  Your health care provider will perform a physical exam. During the exam, your health care provider may:  Look in your nose for signs of abnormal growths in your nostrils (nasal polyps).  Tap over the affected sinus to check for signs of infection.  View the inside of your sinuses (endoscopy) using an imaging device that has a light attached (endoscope). If your health  care provider suspects that you have chronic sinusitis, one or more of the following tests may be recommended:  Allergy tests.  Nasal culture. A sample of mucus is taken from your nose, sent to a lab, and screened for bacteria.  Nasal cytology. A sample of mucus is taken from your nose and examined by your health care provider to determine if your sinusitis is related to an allergy. TREATMENT  Most cases of acute sinusitis are related to a viral infection and will resolve on their own within 10 days. Sometimes medicines are prescribed to help relieve symptoms (pain medicine, decongestants, nasal steroid sprays, or saline sprays).  However, for sinusitis related to a bacterial infection, your health care provider will prescribe antibiotic medicines. These are medicines that will help kill the bacteria causing the infection.  Rarely, sinusitis is caused by a fungal infection. In theses cases, your health care provider will prescribe antifungal medicine. For some cases of chronic sinusitis, surgery is needed. Generally, these are cases in which sinusitis recurs more than 3 times per year, despite other treatments. HOME CARE INSTRUCTIONS   Drink plenty of water. Water helps thin the mucus so your sinuses can drain more easily.  Use a humidifier.  Inhale steam 3 to 4 times a day (for example, sit in the bathroom with the shower running).  Apply a warm, moist washcloth to your face 3 to 4 times a day, or as directed by your health care provider.  Use saline nasal sprays to help moisten and clean your sinuses.  Take medicines only as directed by your health care provider.    If you were prescribed either an antibiotic or antifungal medicine, finish it all even if you start to feel better. SEEK IMMEDIATE MEDICAL CARE IF:  You have increasing pain or severe headaches.  You have nausea, vomiting, or drowsiness.  You have swelling around your face.  You have vision problems.  You have a stiff  neck.  You have difficulty breathing. MAKE SURE YOU:   Understand these instructions.  Will watch your condition.  Will get help right away if you are not doing well or get worse. Document Released: 06/01/2005 Document Revised: 10/16/2013 Document Reviewed: 06/16/2011 Exit[employer] Patient Information 2015 [employer]. This information is not intended to replace advice given to you by your health care provider. Make sure you discuss any questions you have with your health care provider. Allergic Rhinitis Allergic rhinitis is when the mucous membranes in the nose respond to allergens. Allergens are particles in the air that cause your body to have an allergic reaction. This causes you to release allergic antibodies. Through a chain of events, these eventually cause you to release histamine into the blood stream. Although meant to protect the body, it is this release of histamine that causes your discomfort, such as frequent sneezing, congestion, and an itchy, runny nose.  CAUSES  Seasonal allergic rhinitis (hay fever) is caused by pollen allergens that may come from grasses, trees, and weeds. Year-round allergic rhinitis (perennial allergic rhinitis) is caused by allergens such as house dust mites, pet dander, and mold spores.  SYMPTOMS   Nasal stuffiness (congestion).  Itchy, runny nose with sneezing and tearing of the eyes. DIAGNOSIS  Your health care provider can help you determine the allergen or allergens that trigger your symptoms. If you and your health care provider are unable to determine the allergen, skin or blood testing may be used. TREATMENT  Allergic rhinitis does not have a cure, but it can be controlled by:  Medicines and allergy shots (immunotherapy).  Avoiding the allergen. Hay fever may often be treated with antihistamines in pill or nasal spray forms. Antihistamines block the effects of histamine. There are over-the-counter medicines that may help with nasal congestion  and swelling around the eyes. Check with your health care provider before taking or giving this medicine.  If avoiding the allergen or the medicine prescribed do not work, there are many new medicines your health care provider can prescribe. Stronger medicine may be used if initial measures are ineffective. Desensitizing injections can be used if medicine and avoidance does not work. Desensitization is when a patient is given ongoing shots until the body becomes less sensitive to the allergen. Make sure you follow up with your health care provider if problems continue. HOME CARE INSTRUCTIONS It is not possible to completely avoid allergens, but you can reduce your symptoms by taking steps to limit your exposure to them. It helps to know exactly what you are allergic to so that you can avoid your specific triggers. SEEK MEDICAL CARE IF:   You have a fever.  You develop a cough that does not stop easily (persistent).  You have shortness of breath.  You start wheezing.  Symptoms interfere with normal daily activities. Document Released: 02/24/2001 Document Revised: 06/06/2013 Document Reviewed: 02/06/2013 Exit[employer] Patient Information 2015 [employer]. This information is not intended to replace advice given to you by your health care provider. Make sure you discuss any questions you have with your health care provider.  

## 2014-08-22 NOTE — Progress Notes (Signed)
Patient ID: Alexis FabryDeborah S Weaver, female   DOB: Apr 25, 1951, 64 y.o.   MRN: [id number]  HPI  Patient presents to the office for evaluation of cough and nasal congestion.  It has been going on for 2 months.  Patient reports night > day, dry.  They also endorse change in voice, postnasal drip and wheezing.  They have tried antihistamines, intranasal spray.  They report that nothing has worked.  They admits to other sick contacts in her family.    Review of Systems  Constitutional: Negative for fever, chills and weight loss.  HENT: Positive for congestion, ear pain and sore throat. Negative for ear discharge, hearing loss, nosebleeds and tinnitus.   Eyes: Positive for discharge. Negative for pain and redness.  Respiratory: Positive for cough. Negative for sputum production, shortness of breath, wheezing and stridor.   Cardiovascular: Negative for chest pain and leg swelling.  Gastrointestinal: Positive for heartburn and nausea. Negative for vomiting, diarrhea, constipation and blood in stool.  Genitourinary: Negative.   Musculoskeletal: Negative.   Skin: Negative.   Neurological: Positive for dizziness and headaches.  Psychiatric/Behavioral: Negative.     PE: Filed Vitals:   08/22/14 1504  BP: 126/66  Pulse: 88  Temp: 98.2 F (36.8 C)  Resp: 18    General:  Alert and non-toxic, WDWN, NAD HEENT: NCAT, PERLA, EOM normal, no occular discharge or erythema.  Nasal mucosal edema with sinus tenderness to palpation.  Oropharynx clear with minimal oropharyngeal edema and erythema.  Mucous membranes moist and pink. Neck:  Cervical adenopathy Chest:  RRR no MRGs.  Lungs clear to auscultation A&P with no wheezes rhonchi or rales.   Abdomen: +BS x 4 quadrants, soft, non-tender, no guarding, rigidity, or rebound. Skin: warm and dry no rash Neuro: A&Ox4, CN II-XII grossly intact  Assessment and Plan: Suspect symptoms are likely allergic rhinitis in nature but cannot r/o possible sinusitis.  Will try  short course of steroids.  Patient already on singulair, advair, oral antihistamine, flonase. Will try short course of prednisone.  If no better in 3 days try using zpak.  Pt. Due for 31mo f/u.  Will see next month.  1. Allergic rhinitis, unspecified allergic rhinitis type  - predniSONE (DELTASONE) 20 MG tablet; 3 tabs po day one, then 2 tabs daily x 4 days  Dispense: 11 tablet; Refill: 0  2. Acute sinusitis, recurrence not specified, unspecified location  - predniSONE (DELTASONE) 20 MG tablet; 3 tabs po day one, then 2 tabs daily x 4 days  Dispense: 11 tablet; Refill: 0 - azithromycin (ZITHROMAX Z-PAK) 250 MG tablet; Take 2 tablets (500 mg) on  Day 1,  followed by 1 tablet (250 mg) once daily on Days 2 through 5.  Dispense: 6 each; Refill: 1

## 2014-09-17 ENCOUNTER — Other Ambulatory Visit: Payer: Self-pay | Admitting: *Deleted

## 2014-09-17 MED ORDER — FLUTICASONE-SALMETEROL 250-50 MCG/DOSE IN AEPB: 1.0000 | INHALATION_SPRAY | Freq: Two times a day (BID) | RESPIRATORY_TRACT | Status: DC | PRN

## 2014-09-27 ENCOUNTER — Ambulatory Visit: Payer: Self-pay | Admitting: Internal Medicine

## 2014-10-14 ENCOUNTER — Other Ambulatory Visit: Payer: Self-pay | Admitting: Physician Assistant

## 2014-10-26 ENCOUNTER — Other Ambulatory Visit: Payer: Self-pay | Admitting: Physician Assistant

## 2014-11-02 ENCOUNTER — Ambulatory Visit: Payer: Self-pay | Admitting: Internal Medicine

## 2014-11-11 ENCOUNTER — Encounter: Payer: Self-pay | Admitting: *Deleted

## 2014-11-11 DIAGNOSIS — Z1239 Encounter for other screening for malignant neoplasm of breast: Secondary | ICD-10-CM

## 2014-11-24 ENCOUNTER — Other Ambulatory Visit: Payer: Self-pay | Admitting: Physician Assistant

## 2014-12-11 ENCOUNTER — Other Ambulatory Visit: Payer: Self-pay | Admitting: Internal Medicine

## 2015-01-16 ENCOUNTER — Ambulatory Visit (INDEPENDENT_AMBULATORY_CARE_PROVIDER_SITE_OTHER): Payer: 59 | Admitting: Physician Assistant

## 2015-01-16 ENCOUNTER — Encounter: Payer: Self-pay | Admitting: Physician Assistant

## 2015-01-16 VITALS — BP 124/66 | HR 80 | Temp 97.9°F | Resp 16 | Ht 60.5 in | Wt 111.4 lb

## 2015-01-16 DIAGNOSIS — J019 Acute sinusitis, unspecified: Secondary | ICD-10-CM

## 2015-01-16 DIAGNOSIS — J309 Allergic rhinitis, unspecified: Secondary | ICD-10-CM

## 2015-01-16 DIAGNOSIS — B379 Candidiasis, unspecified: Secondary | ICD-10-CM

## 2015-01-16 DIAGNOSIS — J45909 Unspecified asthma, uncomplicated: Secondary | ICD-10-CM

## 2015-01-16 DIAGNOSIS — F329 Major depressive disorder, single episode, unspecified: Secondary | ICD-10-CM

## 2015-01-16 DIAGNOSIS — Z0001 Encounter for general adult medical examination with abnormal findings: Secondary | ICD-10-CM

## 2015-01-16 DIAGNOSIS — R002 Palpitations: Secondary | ICD-10-CM

## 2015-01-16 DIAGNOSIS — Z79899 Other long term (current) drug therapy: Secondary | ICD-10-CM

## 2015-01-16 DIAGNOSIS — M8580 Other specified disorders of bone density and structure, unspecified site: Secondary | ICD-10-CM

## 2015-01-16 DIAGNOSIS — M25511 Pain in right shoulder: Secondary | ICD-10-CM

## 2015-01-16 DIAGNOSIS — M25512 Pain in left shoulder: Secondary | ICD-10-CM

## 2015-01-16 DIAGNOSIS — F32A Depression, unspecified: Secondary | ICD-10-CM

## 2015-01-16 DIAGNOSIS — J320 Chronic maxillary sinusitis: Secondary | ICD-10-CM

## 2015-01-16 DIAGNOSIS — E559 Vitamin D deficiency, unspecified: Secondary | ICD-10-CM

## 2015-01-16 DIAGNOSIS — I10 Essential (primary) hypertension: Secondary | ICD-10-CM | POA: Diagnosis not present

## 2015-01-16 DIAGNOSIS — Z0000 Encounter for general adult medical examination without abnormal findings: Secondary | ICD-10-CM | POA: Diagnosis not present

## 2015-01-16 DIAGNOSIS — R7303 Prediabetes: Secondary | ICD-10-CM

## 2015-01-16 DIAGNOSIS — D649 Anemia, unspecified: Secondary | ICD-10-CM

## 2015-01-16 DIAGNOSIS — F419 Anxiety disorder, unspecified: Secondary | ICD-10-CM

## 2015-01-16 DIAGNOSIS — K589 Irritable bowel syndrome without diarrhea: Secondary | ICD-10-CM

## 2015-01-16 LAB — CBC WITH DIFFERENTIAL/PLATELET
BASOS ABS: 0 10*3/uL (ref 0.0–0.1)
Basophils Relative: 1 % (ref 0–1)
Eosinophils Absolute: 0 10*3/uL (ref 0.0–0.7)
Eosinophils Relative: 1 % (ref 0–5)
HCT: 35.3 % — ABNORMAL LOW (ref 36.0–46.0)
Hemoglobin: 11.4 g/dL — ABNORMAL LOW (ref 12.0–15.0)
Lymphocytes Relative: 38 % (ref 12–46)
Lymphs Abs: 1.5 10*3/uL (ref 0.7–4.0)
MCH: 29.6 pg (ref 26.0–34.0)
MCHC: 32.3 g/dL (ref 30.0–36.0)
MCV: 91.7 fL (ref 78.0–100.0)
MPV: 10 fL (ref 8.6–12.4)
Monocytes Absolute: 0.3 10*3/uL (ref 0.1–1.0)
Monocytes Relative: 8 % (ref 3–12)
NEUTROS PCT: 52 % (ref 43–77)
Neutro Abs: 2 10*3/uL (ref 1.7–7.7)
Platelets: 260 10*3/uL (ref 150–400)
RBC: 3.85 MIL/uL — ABNORMAL LOW (ref 3.87–5.11)
RDW: 14.8 % (ref 11.5–15.5)
WBC: 3.9 10*3/uL — ABNORMAL LOW (ref 4.0–10.5)

## 2015-01-16 LAB — HEMOGLOBIN A1C
HEMOGLOBIN A1C: 6 % — AB (ref <5.7)
Mean Plasma Glucose: 126 mg/dL — ABNORMAL HIGH (ref <117)

## 2015-01-16 MED ORDER — FLUCONAZOLE 150 MG PO TABS: 150.0000 mg | ORAL_TABLET | Freq: Every day | ORAL | Status: DC

## 2015-01-16 MED ORDER — FIRST-DUKES MOUTHWASH MT SUSP: OROMUCOSAL | Status: DC

## 2015-01-16 MED ORDER — TRIAMCINOLONE ACETONIDE 0.1 % EX CREA: 1.0000 "application " | TOPICAL_CREAM | Freq: Two times a day (BID) | CUTANEOUS | Status: DC

## 2015-01-16 MED ORDER — PREDNISONE 20 MG PO TABS
ORAL_TABLET | ORAL | Status: DC
Start: 2015-01-16 — End: 2015-07-03

## 2015-01-16 NOTE — Patient Instructions (Addendum)
Get colonoscopy  GETTING OFF OF PPI's    Nexium/protonix/prilosec/Omeprazole/Dexilant/Aciphex are called PPI's, they are great at healing your stomach but should only be taken for a short period of time.     Recent studies have shown that taken for a long time they  can increase the risk of osteoporosis (weakening of your bones), pneumonia, low magnesium, restless legs, Cdiff (infection that causes diarrhea), DEMENTIA and most recently kidney damage / disease / insufficiency.     Due to this information we want to try to stop the PPI but if you try to stop it abruptly this can cause rebound acid and worsening symptoms.   So this is how we want you to get off the PPI:  - Start taking the nexium/protonix/prilosec/PPI  every other day with  zantac (ranitidine) 2 x a day for 2-4 weeks  - then decrease the PPI to every 3 days while taking the zantac (ranitidine) twice a day the other  days for 2-4  Weeks  - then you can try the zantac (ranitidine) once at night or up to 2 x day as needed.  - you can continue on this once at night or stop all together  - Avoid alcohol, spicy foods, NSAIDS (aleve, ibuprofen) at this time. See foods below.   +++++++++++++++++++++++++++++++++++++++++++  Food Choices for Gastroesophageal Reflux Disease  When you have gastroesophageal reflux disease (GERD), the foods you eat and your eating habits are very important. Choosing the right foods can help ease the discomfort of GERD. WHAT GENERAL GUIDELINES DO I NEED TO FOLLOW?  Choose fruits, vegetables, whole grains, low-fat dairy products, and low-fat meat, fish, and poultry.  Limit fats such as oils, salad dressings, butter, nuts, and avocado.  Keep a food diary to identify foods that cause symptoms.  Avoid foods that cause reflux. These may be different for different people.  Eat frequent small meals instead of three large meals each day.  Eat your meals slowly, in a relaxed setting.  Limit fried  foods.  Cook foods using methods other than frying.  Avoid drinking alcohol.  Avoid drinking large amounts of liquids with your meals.  Avoid bending over or lying down until 2-3 hours after eating.   WHAT FOODS ARE NOT RECOMMENDED? The following are some foods and drinks that may worsen your symptoms:  Vegetables Tomatoes. Tomato juice. Tomato and spaghetti sauce. Chili peppers. Onion and garlic. Horseradish. Fruits Oranges, grapefruit, and lemon (fruit and juice). Meats High-fat meats, fish, and poultry. This includes hot dogs, ribs, ham, sausage, salami, and bacon. Dairy Whole milk and chocolate milk. Sour cream. Cream. Butter. Ice cream. Cream cheese.  Beverages Coffee and tea, with or without caffeine. Carbonated beverages or energy drinks. Condiments Hot sauce. Barbecue sauce.  Sweets/Desserts Chocolate and cocoa. Donuts. Peppermint and spearmint. Fats and Oils High-fat foods, including [place] fries and potato chips. Other Vinegar. Strong spices, such as black pepper, white pepper, red pepper, cayenne, curry powder, cloves, ginger, and chili powder. Nexium/protonix/prilosec are called PPI's, they are great at healing your stomach but should only be taken for a short period of time.     Preventive Care for Adults  A healthy lifestyle and preventive care can promote health and wellness. Preventive health guidelines for women include the following key practices.  A routine yearly physical is a good way to check with your health care provider about your health and preventive screening. It is a chance to share any concerns and updates on your health and to receive  a thorough exam.  Visit your dentist for a routine exam and preventive care every 6 months. Brush your teeth twice a day and floss once a day. Good oral hygiene prevents tooth decay and gum disease.  The frequency of eye exams is based on your age, health, family medical history, use of contact lenses, and other  factors. Follow your health care provider's recommendations for frequency of eye exams.  Eat a healthy diet. Foods like vegetables, fruits, whole grains, low-fat dairy products, and lean protein foods contain the nutrients you need without too many calories. Decrease your intake of foods high in solid fats, added sugars, and salt. Eat the right amount of calories for you.Get information about a proper diet from your health care provider, if necessary.  Regular physical exercise is one of the most important things you can do for your health. Most adults should get at least 150 minutes of moderate-intensity exercise (any activity that increases your heart rate and causes you to sweat) each week. In addition, most adults need muscle-strengthening exercises on 2 or more days a week.  Maintain a healthy weight. The body mass index (BMI) is a screening tool to identify possible weight problems. It provides an estimate of body fat based on height and weight. Your health care provider can find your BMI and can help you achieve or maintain a healthy weight.For adults 20 years and older:  A BMI below 18.5 is considered underweight.  A BMI of 18.5 to 24.9 is normal.  A BMI of 25 to 29.9 is considered overweight.  A BMI of 30 and above is considered obese.  Maintain normal blood lipids and cholesterol levels by exercising and minimizing your intake of saturated fat. Eat a balanced diet with plenty of fruit and vegetables. Blood tests for lipids and cholesterol should begin at age 63 and be repeated every 5 years. If your lipid or cholesterol levels are high, you are over 50, or you are at high risk for heart disease, you may need your cholesterol levels checked more frequently.Ongoing high lipid and cholesterol levels should be treated with medicines if diet and exercise are not working.  If you smoke, find out from your health care provider how to quit. If you do not use tobacco, do not start.  Lung  cancer screening is recommended for adults aged 60-80 years who are at high risk for developing lung cancer because of a history of smoking. A yearly low-dose CT scan of the lungs is recommended for people who have at least a 30-pack-year history of smoking and are a current smoker or have quit within the past 15 years. A pack year of smoking is smoking an average of 1 pack of cigarettes a day for 1 year (for example: 1 pack a day for 30 years or 2 packs a day for 15 years). Yearly screening should continue until the smoker has stopped smoking for at least 15 years. Yearly screening should be stopped for people who develop a health problem that would prevent them from having lung cancer treatment.  High blood pressure causes heart disease and increases the risk of stroke. Your blood pressure should be checked at least every 1 to 2 years. Ongoing high blood pressure should be treated with medicines if weight loss and exercise do not work.  If you are 79-65 years old, ask your health care provider if you should take aspirin to prevent strokes.  Diabetes screening involves taking a blood sample to check your fasting  blood sugar level. This should be done once every 3 years, after age 39, if you are within normal weight and without risk factors for diabetes. Testing should be considered at a younger age or be carried out more frequently if you are overweight and have at least 1 risk factor for diabetes.  Breast cancer screening is essential preventive care for women. You should practice "breast self-awareness." This means understanding the normal appearance and feel of your breasts and may include breast self-examination. Any changes detected, no matter how small, should be reported to a health care provider. Women in their 90s and 30s should have a clinical breast exam (CBE) by a health care provider as part of a regular health exam every 1 to 3 years. After age 25, women should have a CBE every year. Starting  at age 41, women should consider having a mammogram (breast X-ray test) every year. Women who have a family history of breast cancer should talk to their health care provider about genetic screening. Women at a high risk of breast cancer should talk to their health care providers about having an MRI and a mammogram every year.  Breast cancer gene (BRCA)-related cancer risk assessment is recommended for women who have family members with BRCA-related cancers. BRCA-related cancers include breast, ovarian, tubal, and peritoneal cancers. Having family members with these cancers may be associated with an increased risk for harmful changes (mutations) in the breast cancer genes BRCA1 and BRCA2. Results of the assessment will determine the need for genetic counseling and BRCA1 and BRCA2 testing.  Routine pelvic exams to screen for cancer are no longer recommended for nonpregnant women who are considered low risk for cancer of the pelvic organs (ovaries, uterus, and vagina) and who do not have symptoms. Ask your health care provider if a screening pelvic exam is right for you.  If you have had past treatment for cervical cancer or a condition that could lead to cancer, you need Pap tests and screening for cancer for at least 20 years after your treatment. If Pap tests have been discontinued, your risk factors (such as having a new sexual partner) need to be reassessed to determine if screening should be resumed. Some women have medical problems that increase the chance of getting cervical cancer. In these cases, your health care provider may recommend more frequent screening and Pap tests.  Colorectal cancer can be detected and often prevented. Most routine colorectal cancer screening begins at the age of 31 years and continues through age 89 years. However, your health care provider may recommend screening at an earlier age if you have risk factors for colon cancer. On a yearly basis, your health care provider may  provide home test kits to check for hidden blood in the stool. Use of a small camera at the end of a tube, to directly examine the colon (sigmoidoscopy or colonoscopy), can detect the earliest forms of colorectal cancer. Talk to your health care provider about this at age 25, when routine screening begins. Direct exam of the colon should be repeated every 5-10 years through age 49 years, unless early forms of pre-cancerous polyps or small growths are found.  Hepatitis C blood testing is recommended for all people born from [birth year] through 1965 and any individual with known risks for hepatitis C.  Pra  Osteoporosis is a disease in which the bones lose minerals and strength with aging. This can result in serious bone fractures or breaks. The risk of osteoporosis can be identified  using a bone density scan. Women ages 71 years and over and women at risk for fractures or osteoporosis should discuss screening with their health care providers. Ask your health care provider whether you should take a calcium supplement or vitamin D to reduce the rate of osteoporosis.  Menopause can be associated with physical symptoms and risks. Hormone replacement therapy is available to decrease symptoms and risks. You should talk to your health care provider about whether hormone replacement therapy is right for you.  Use sunscreen. Apply sunscreen liberally and repeatedly throughout the day. You should seek shade when your shadow is shorter than you. Protect yourself by wearing long sleeves, pants, a wide-brimmed hat, and sunglasses year round, whenever you are outdoors.  Once a month, do a whole body skin exam, using a mirror to look at the skin on your back. Tell your health care provider of new moles, moles that have irregular borders, moles that are larger than a pencil eraser, or moles that have changed in shape or color.  Stay current with required vaccines (immunizations).  Influenza vaccine. All adults should be  immunized every year.  Tetanus, diphtheria, and acellular pertussis (Td, Tdap) vaccine. Pregnant women should receive 1 dose of Tdap vaccine during each pregnancy. The dose should be obtained regardless of the length of time since the last dose. Immunization is preferred during the 27th-36th week of gestation. An adult who has not previously received Tdap or who does not know her vaccine status should receive 1 dose of Tdap. This initial dose should be followed by tetanus and diphtheria toxoids (Td) booster doses every 10 years. Adults with an unknown or incomplete history of completing a 3-dose immunization series with Td-containing vaccines should begin or complete a primary immunization series including a Tdap dose. Adults should receive a Td booster every 10 years.  Varicella vaccine. An adult without evidence of immunity to varicella should receive 2 doses or a second dose if she has previously received 1 dose. Pregnant females who do not have evidence of immunity should receive the first dose after pregnancy. This first dose should be obtained before leaving the health care facility. The second dose should be obtained 4-8 weeks after the first dose.  Human papillomavirus (HPV) vaccine. Females aged 13-26 years who have not received the vaccine previously should obtain the 3-dose series. The vaccine is not recommended for use in pregnant females. However, pregnancy testing is not needed before receiving a dose. If a female is found to be pregnant after receiving a dose, no treatment is needed. In that case, the remaining doses should be delayed until after the pregnancy. Immunization is recommended for any person with an immunocompromised condition through the age of 40 years if she did not get any or all doses earlier. During the 3-dose series, the second dose should be obtained 4-8 weeks after the first dose. The third dose should be obtained 24 weeks after the first dose and 16 weeks after the second  dose.  Zoster vaccine. One dose is recommended for adults aged 90 years or older unless certain conditions are present.  Measles, mumps, and rubella (MMR) vaccine. Adults born before [birth year] generally are considered immune to measles and mumps. Adults born in [birth year] or later should have 1 or more doses of MMR vaccine unless there is a contraindication to the vaccine or there is laboratory evidence of immunity to each of the three diseases. A routine second dose of MMR vaccine should be obtained at least 28  days after the first dose for students attending postsecondary schools, health care workers, or international travelers. People who received inactivated measles vaccine or an unknown type of measles vaccine during 1963-1967 should receive 2 doses of MMR vaccine. People who received inactivated mumps vaccine or an unknown type of mumps vaccine before 1979 and are at high risk for mumps infection should consider immunization with 2 doses of MMR vaccine. For females of childbearing age, rubella immunity should be determined. If there is no evidence of immunity, females who are not pregnant should be vaccinated. If there is no evidence of immunity, females who are pregnant should delay immunization until after pregnancy. Unvaccinated health care workers born before [birth year] who lack laboratory evidence of measles, mumps, or rubella immunity or laboratory confirmation of disease should consider measles and mumps immunization with 2 doses of MMR vaccine or rubella immunization with 1 dose of MMR vaccine.  Pneumococcal 13-valent conjugate (PCV13) vaccine. When indicated, a person who is uncertain of her immunization history and has no record of immunization should receive the PCV13 vaccine. An adult aged 27 years or older who has certain medical conditions and has not been previously immunized should receive 1 dose of PCV13 vaccine. This PCV13 should be followed with a dose of pneumococcal polysaccharide (PPSV23) vaccine.  The PPSV23 vaccine dose should be obtained at least 8 weeks after the dose of PCV13 vaccine. An adult aged 41 years or older who has certain medical conditions and previously received 1 or more doses of PPSV23 vaccine should receive 1 dose of PCV13. The PCV13 vaccine dose should be obtained 1 or more years after the last PPSV23 vaccine dose.    Pneumococcal polysaccharide (PPSV23) vaccine. When PCV13 is also indicated, PCV13 should be obtained first. All adults aged 68 years and older should be immunized. An adult younger than age 22 years who has certain medical conditions should be immunized. Any person who resides in a nursing home or long-term care facility should be immunized. An adult smoker should be immunized. People with an immunocompromised condition and certain other conditions should receive both PCV13 and PPSV23 vaccines. People with human immunodeficiency virus (HIV) infection should be immunized as soon as possible after diagnosis. Immunization during chemotherapy or radiation therapy should be avoided. Routine use of PPSV23 vaccine is not recommended for [place] Indians, [place]atives, or people younger than 65 years unless there are medical conditions that require PPSV23 vaccine. When indicated, people who have unknown immunization and have no record of immunization should receive PPSV23 vaccine. One-time revaccination 5 years after the first dose of PPSV23 is recommended for people aged 19-64 years who have chronic kidney failure, nephrotic syndrome, asplenia, or immunocompromised conditions. People who received 1-2 doses of PPSV23 before age 21 years should receive another dose of PPSV23 vaccine at age 65 years or later if at least 5 years have passed since the previous dose. Doses of PPSV23 are not needed for people immunized with PPSV23 at or after age 71 years.  Preventive Services / Frequency   Ages 45 to 47 years  Blood pressure check.  Lipid and cholesterol check.  Lung  cancer screening. / Every year if you are aged 53-80 years and have a 30-pack-year history of smoking and currently smoke or have quit within the past 15 years. Yearly screening is stopped once you have quit smoking for at least 15 years or develop a health problem that would prevent you from having lung cancer treatment.  Clinical breast exam.** / Every  year after age 28 years.  BRCA-related cancer risk assessment.** / For women who have family members with a BRCA-related cancer (breast, ovarian, tubal, or peritoneal cancers).  Mammogram.** / Every year beginning at age 69 years and continuing for as long as you are in good health. Consult with your health care provider.  Pap test.** / Every 3 years starting at age 40 years through age 3 or 4 years with a history of 3 consecutive normal Pap tests.  HPV screening.** / Every 3 years from ages 25 years through ages 60 to 15 years with a history of 3 consecutive normal Pap tests.  Fecal occult blood test (FOBT) of stool. / Every year beginning at age 70 years and continuing until age 88 years. You may not need to do this test if you get a colonoscopy every 10 years.  Flexible sigmoidoscopy or colonoscopy.** / Every 5 years for a flexible sigmoidoscopy or every 10 years for a colonoscopy beginning at age 43 years and continuing until age 72 years.  Hepatitis C blood test.** / For all people born from [birth year] through 1965 and any individual with known risks for hepatitis C.  Skin self-exam. / Monthly.  Influenza vaccine. / Every year.  Tetanus, diphtheria, and acellular pertussis (Tdap/Td) vaccine.** / Consult your health care provider. Pregnant women should receive 1 dose of Tdap vaccine during each pregnancy. 1 dose of Td every 10 years.  Varicella vaccine.** / Consult your health care provider. Pregnant females who do not have evidence of immunity should receive the first dose after pregnancy.  Zoster vaccine.** / 1 dose for adults aged 47  years or older.  Pneumococcal 13-valent conjugate (PCV13) vaccine.** / Consult your health care provider.  Pneumococcal polysaccharide (PPSV23) vaccine.** / 1 to 2 doses if you smoke cigarettes or if you have certain conditions.  Meningococcal vaccine.** / Consult your health care provider.  Hepatitis A vaccine.** / Consult your health care provider.  Hepatitis B vaccine.** / Consult your health care provider. Screening for abdominal aortic aneurysm (AAA)  by ultrasound is recommended for people over 50 who have history of high blood pressure or who are current or former smokers.

## 2015-01-16 NOTE — Progress Notes (Signed)
Complete Physical  Assessment and Plan: 1. Allergic rhinitis, unspecified allergic rhinitis type - predniSONE (DELTASONE) 20 MG tablet; 3 tabs po day one, then 2 tabs daily x 4 days  Dispense: 11 tablet; Refill: 0  2. Acute sinusitis, recurrence not specified, unspecified location - predniSONE (DELTASONE) 20 MG tablet; 3 tabs po day one, then 2 tabs daily x 4 days  Dispense: 11 tablet; Refill: 0  3. Prediabetes Discussed general issues about diabetes pathophysiology and management., Educational material distributed., Suggested low cholesterol diet., Encouraged aerobic exercise., Discussed foot care., Reminded to get yearly retinal exam. - Lipid panel - Hemoglobin A1c - Insulin, fasting - Urinalysis, Routine w reflex microscopic (not at [facility]) - Microalbumin / creatinine urine ratio - EKG 12-Lead  4. Osteopenia Get DEXA  5. Chronic maxillary sinusitis monitor  6. Palpitations Better will decreased stress  7. Vitamin D deficiency - Vit D  25 hydroxy (rtn osteoporosis monitoring)  8. Encounter for long-term (current) use of medications - CBC with Differential/Platelet - BASIC METABOLIC PANEL WITH GFR - Hepatic function panel - Magnesium  9. Depression Follow up Dr. Lyanne Co, no SI/HI - TSH  10. Anxiety Follow up Dr. Lyanne Co  11. Bilateral shoulder pain Remission/controlled  12. IBS (irritable bowel syndrome)-constipation type Continue amitza  13. Asthma, chronic, unspecified asthma severity, uncomplicated No signs of infection at this time, will check CBC, get back on advair and  - predniSONE (DELTASONE) 20 MG tablet; 3 tabs po day one, then 2 tabs daily x 4 days  Dispense: 11 tablet; Refill: 0  14. Yeast infection If not better needs pelvic exam here or at Dr. Chevis Pretty - fluconazole (DIFLUCAN) 150 MG tablet; Take 1 tablet (150 mg total) by mouth daily.  Dispense: 1 tablet; Refill: 3 - Diphenhyd-Hydrocort-Nystatin (FIRST-DUKES MOUTHWASH) SUSP; 1 tsp swish and swallow q 2  hours as needed  Dispense: 240 mL; Refill: 1  15. Anemia, unspecified anemia type - Iron and TIBC - Ferritin - Vitamin B12   Discussed med's effects and SE's. Screening labs and tests as requested with regular follow-up as recommended.  HPI 64 y.o. female  presents for a complete physical.  Her blood pressure has been controlled at home, today their BP is BP: 124/66 mmHg She does workout. She denies chest pain, shortness of breath, dizziness.  She is not on cholesterol medication and denies myalgias. Her cholesterol is at goal. The cholesterol last visit was:  Lab Results  Component Value Date   CHOL 193 01/15/2014   HDL 91 01/15/2014   LDLCALC 92 01/15/2014   TRIG 48 01/15/2014   CHOLHDL 2.1 01/15/2014   Last Z6X in the office was: Lab Results  Component Value Date   HGBA1C 5.9* 06/26/2014   Patient is on Vitamin D supplement Lab Results  Component Value Date   VD25OH 51 06/26/2014   She has had a negative ANA, antiDNA, RF.    She is on estrogen therapy and on bASA. She sees a therapist every other week and sees Dr. Evelene Croon twice a year, she is on savella and recently added deplin, has been on it for 3 weeks. Very upset about her son relapsing and still taking care of her brother.  Seeing Dr. Ezzard Standing  Has seen Dr. Hyder Callas for allergies and asthma, has very bad allergies at this time, not using advair/flonase,  Feels like she has a yeast infection and has been using clomitazole OTC without relief, slight discharge with itching.  She gardens and has some dermatitis on her legs,  itches.  Had deer tick left lower AB in April, not on for longer than 24 hours.   Current Medications:  Current Outpatient Prescriptions on File Prior to Visit  Medication Sig Dispense Refill  . ALPRAZolam (XANAX) 0.25 MG tablet take 1 tablet by mouth three times a day if needed for anxiety 90 tablet 5  . AMITIZA 24 MCG capsule take 1 capsule by mouth twice a day 60 capsule 3  . aspirin 81 MG tablet  Take 81 mg by mouth daily.      . AXERT 12.5 MG tablet take 1 tablet by mouth if needed for MIGRAINE.   may repeat if needed   (MAXIMUM OF 2 TABLETS IN 24 HOURS) 12 tablet 2  . bimatoprost (LATISSE) 0.03 % ophthalmic solution Place one drop on applicator and apply evenly along the skin of the upper eyelid at base of eyelashes once daily at bedtime; repeat procedure for second eye (use a clean applicator). 3 mL 3  . cycloSPORINE (RESTASIS) 0.05 % ophthalmic emulsion Place 1 drop into both eyes 2 (two) times daily.      Marland Kitchen dextroamphetamine (DEXEDRINE SPANSULE) 10 MG 24 hr capsule Take 10 mg by mouth daily.   0  . estradiol (VIVELLE-DOT) 0.05 MG/24HR patch Place 1 patch (0.05 mg total) onto the skin 2 (two) times a week. 8 patch prn  . fluticasone (FLONASE) 50 MCG/ACT nasal spray Place 2 sprays into both nostrils daily.    . Fluticasone-Salmeterol (ADVAIR) 250-50 MCG/DOSE AEPB Inhale 1 puff into the lungs 2 (two) times daily as needed. For shortness of breath 60 each 11  . loratadine-pseudoephedrine (CLARITIN-D 24-HOUR) 10-240 MG per 24 hr tablet Take 1 tablet by mouth daily.    . magnesium gluconate (MAGONATE) 500 MG tablet Take 500 mg by mouth daily.    . Milnacipran HCl (SAVELLA) 100 MG TABS tablet Take 1 to 2 tablets daily as directed for mood & depression 60 tablet 99  . montelukast (SINGULAIR) 10 MG tablet take 1 tablet by mouth once daily 30 tablet 99  . NEXIUM 40 MG capsule TAKE ONE CAPSULE EVERY DAY 30 capsule 11  . ondansetron (ZOFRAN) 8 MG tablet Take 1 tablet (8 mg total) by mouth every 8 (eight) hours as needed for nausea or vomiting. 100 tablet 0  . predniSONE (DELTASONE) 20 MG tablet 3 tabs po day one, then 2 tabs daily x 4 days 11 tablet 0  . RELPAX 40 MG tablet take 1 tablet by mouth immediately and May repeat one time after 2 hours  (NO MORE THAN 2 TABLETS IN 24 HOURS) 6 tablet 1  . triamterene-hydrochlorothiazide (MAXZIDE) 75-50 MG per tablet take 1 tablet by mouth once daily for  blood pressure and fluid retention 34 tablet PRN  . VITAMIN D, ERGOCALCIFEROL, PO Take by mouth. 5,000 IU daily     No current facility-administered medications on file prior to visit.   Health Maintenance:   Immunization History  Administered Date(s) Administered  . DT 11/06/2004  . Influenza-Unspecified 02/14/2012  . Tdap 02/26/2011   Tetanus: 2012 Pneumovax: N/A Prevnar 13: due at age 75 Flu vaccine: 2013 Zostavax: N/A Pap: 2012  Follow up OB/GYN MGM: 08/2014, [place] left breast 09/2014 normal DEXA: 09/2010 Colonoscopy: 2006 due 2016 EGD: N/A MRI brain 2010 CXR 2015  Allergies:  Allergies  Allergen Reactions  . Hydrocodone-Acetaminophen Shortness Of Breath  . Codeine Hives  . Morphine And Related Hives  . Nitrofurantoin Other (See Comments)    Makes hands and  feet burn   Medical History:  Past Medical History  Diagnosis Date  . Asthma   . Chronic sinusitis   . Anxiety   . Depression   . Osteopenia    Surgical History:  Past Surgical History  Procedure Laterality Date  . Abdominal hysterectomy    . Cesarian    . Breast enhancement surgery     Family History: No family history on file. Social History:  History  Substance Use Topics  . Smoking status: Never Smoker   . Smokeless tobacco: Not on file  . Alcohol Use: No   Review of Systems  Constitutional: Positive for malaise/fatigue. Negative for fever, chills, weight loss and diaphoresis.  HENT: Positive for congestion. Negative for ear discharge, ear pain, hearing loss, nosebleeds, sore throat and tinnitus.   Eyes: Negative.   Respiratory: Positive for cough, shortness of breath and wheezing. Negative for hemoptysis, sputum production and stridor.   Cardiovascular: Negative.   Gastrointestinal: Positive for heartburn (better on nexium) and constipation (on amitiza). Negative for nausea, vomiting, abdominal pain, diarrhea, blood in stool and melena.  Genitourinary: Positive for frequency. Negative for  dysuria, urgency, hematuria and flank pain.  Musculoskeletal: Positive for myalgias. Negative for back pain, joint pain, falls and neck pain.  Skin: Negative.   Neurological: Negative.  Negative for weakness and headaches.  Psychiatric/Behavioral: Positive for depression. Negative for suicidal ideas, hallucinations, memory loss and substance abuse. The patient is not nervous/anxious.     Physical Exam: Estimated body mass index is 21.39 kg/(m^2) as calculated from the following:   Height as of this encounter: 5' 0.5" (1.537 m).   Weight as of this encounter: 111 lb 6.4 oz (50.531 kg). BP 124/66 mmHg  Pulse 80  Temp(Src) 97.9 F (36.6 C)  Resp 16  Ht 5' 0.5" (1.537 m)  Wt 111 lb 6.4 oz (50.531 kg)  BMI 21.39 kg/m2 General Appearance: Well nourished, in no apparent distress. Eyes: PERRLA, EOMs, conjunctiva no swelling or erythema, normal fundi and vessels. Sinuses: No Frontal/maxillary tenderness ENT/Mouth: Ext aud canals clear, normal light reflex with TMs without erythema, bulging.  Good dentition. No erythema, swelling, or exudate on post pharynx. Tonsils not swollen or erythematous. Hearing normal.  Neck: Supple, thyroid normal. No bruits Respiratory: Respiratory effort normal, coarse breath sounds, BS equal bilaterally without rales, rhonchi, wheezing or stridor. Cardio: RRR without murmurs, rubs or gallops. Brisk peripheral pulses without edema.  Chest: symmetric, with normal excursions and percussion. Breasts: defer  Abdomen: Soft, +BS. Non tender, no guarding, rebound, hernias, masses, or organomegaly. .  Lymphatics: Non tender without lymphadenopathy.  Genitourinary: defer Musculoskeletal: Full ROM all peripheral extremities,5/5 strength, and normal gait. Skin: On bilateral legs, erythematous papules. Warm, dry without lesions, ecchymosis.  Neuro: Cranial nerves intact, reflexes equal bilaterally. Normal muscle tone, no cerebellar symptoms. Sensation intact.  Psych: Awake and  oriented X 3, normal affect, Insight and Judgment appropriate.   EKG: WNL no changes.   Quentin Mulling 9:14 AM

## 2015-01-17 ENCOUNTER — Other Ambulatory Visit: Payer: Self-pay | Admitting: *Deleted

## 2015-01-17 LAB — HEPATIC FUNCTION PANEL
ALBUMIN: 3.4 g/dL — AB (ref 3.6–5.1)
ALK PHOS: 52 U/L (ref 33–130)
ALT: 18 U/L (ref 6–29)
AST: 22 U/L (ref 10–35)
BILIRUBIN DIRECT: 0.1 mg/dL (ref <=0.2)
Indirect Bilirubin: 0.6 mg/dL (ref 0.2–1.2)
Total Bilirubin: 0.7 mg/dL (ref 0.2–1.2)
Total Protein: 6 g/dL — ABNORMAL LOW (ref 6.1–8.1)

## 2015-01-17 LAB — URINALYSIS, ROUTINE W REFLEX MICROSCOPIC
BILIRUBIN URINE: NEGATIVE
Glucose, UA: NEGATIVE
HGB URINE DIPSTICK: NEGATIVE
Ketones, ur: NEGATIVE
Leukocytes, UA: NEGATIVE
NITRITE: NEGATIVE
PH: 7 (ref 5.0–8.0)
Protein, ur: NEGATIVE
SPECIFIC GRAVITY, URINE: 1.02 (ref 1.001–1.035)

## 2015-01-17 LAB — LIPID PANEL
Cholesterol: 168 mg/dL (ref 125–200)
HDL: 84 mg/dL (ref >=46)
LDL CALC: 75 mg/dL (ref <130)
TRIGLYCERIDES: 44 mg/dL (ref <150)
Total CHOL/HDL Ratio: 2 Ratio (ref <=5.0)
VLDL: 9 mg/dL (ref <30)

## 2015-01-17 LAB — INSULIN, FASTING: Insulin fasting, serum: 2.2 u[IU]/mL (ref 2.0–19.6)

## 2015-01-17 LAB — FERRITIN: Ferritin: 11 ng/mL (ref 10–291)

## 2015-01-17 LAB — TSH: TSH: 1.927 u[IU]/mL (ref 0.350–4.500)

## 2015-01-17 LAB — BASIC METABOLIC PANEL WITH GFR
BUN: 15 mg/dL (ref 7–25)
CO2: 27 mmol/L (ref 20–31)
CREATININE: 0.67 mg/dL (ref 0.50–0.99)
Calcium: 8.7 mg/dL (ref 8.6–10.4)
Chloride: 104 mmol/L (ref 98–110)
GFR, Est African American: >89 mL/min (ref >=60)
GFR, Est Non African American: >89 mL/min (ref >=60)
GLUCOSE: 80 mg/dL (ref 65–99)
POTASSIUM: 3.8 mmol/L (ref 3.5–5.3)
SODIUM: 138 mmol/L (ref 135–146)

## 2015-01-17 LAB — MICROALBUMIN / CREATININE URINE RATIO
CREATININE, URINE: 119.7 mg/dL
MICROALB UR: 0.3 mg/dL (ref <2.0)
Microalb Creat Ratio: 2.5 mg/g (ref 0.0–30.0)

## 2015-01-17 LAB — IRON AND TIBC
%SAT: 31 % (ref 20–55)
IRON: 116 ug/dL (ref 42–145)
TIBC: 372 ug/dL (ref 250–470)
UIBC: 256 ug/dL (ref 125–400)

## 2015-01-17 LAB — VITAMIN D 25 HYDROXY (VIT D DEFICIENCY, FRACTURES): Vit D, 25-Hydroxy: 40 ng/mL (ref 30–100)

## 2015-01-17 LAB — MAGNESIUM: Magnesium: 1.7 mg/dL (ref 1.5–2.5)

## 2015-01-17 LAB — VITAMIN B12: Vitamin B-12: 470 pg/mL (ref 211–911)

## 2015-01-17 MED ORDER — BIMATOPROST 0.03 % EX SOLN: CUTANEOUS | Status: DC

## 2015-01-17 NOTE — Addendum Note (Signed)
Addended by: Quentin Mulling R on: 01/17/2015 07:35 AM   Modules accepted: Orders, SmartSet

## 2015-01-23 ENCOUNTER — Encounter: Payer: Self-pay | Admitting: Physician Assistant

## 2015-01-24 NOTE — Telephone Encounter (Signed)
Alexis Weaver,  Please put a referral in for colonoscopy screening for patient, thank you.  Katrina

## 2015-02-01 ENCOUNTER — Encounter: Payer: Self-pay | Admitting: Physician Assistant

## 2015-02-03 ENCOUNTER — Encounter: Payer: Self-pay | Admitting: Physician Assistant

## 2015-02-04 ENCOUNTER — Ambulatory Visit
Admission: RE | Admit: 2015-02-04 | Discharge: 2015-02-04 | Disposition: A | Discharge status: Home / Self Care | Payer: 59 | Source: Ambulatory Visit | Attending: Physician Assistant | Admitting: Physician Assistant

## 2015-02-04 ENCOUNTER — Other Ambulatory Visit: Payer: Self-pay | Admitting: Physician Assistant

## 2015-02-04 DIAGNOSIS — M8580 Other specified disorders of bone density and structure, unspecified site: Secondary | ICD-10-CM

## 2015-02-04 MED ORDER — AZITHROMYCIN 250 MG PO TABS: ORAL_TABLET | ORAL | Status: AC

## 2015-02-06 ENCOUNTER — Other Ambulatory Visit: Payer: Self-pay

## 2015-02-06 MED ORDER — ONDANSETRON HCL 8 MG PO TABS: 8.0000 mg | ORAL_TABLET | Freq: Three times a day (TID) | ORAL | Status: DC | PRN

## 2015-04-03 ENCOUNTER — Encounter: Payer: Self-pay | Admitting: Gastroenterology

## 2015-04-09 ENCOUNTER — Other Ambulatory Visit: Payer: Self-pay | Admitting: Internal Medicine

## 2015-04-11 ENCOUNTER — Encounter: Payer: Self-pay | Admitting: Internal Medicine

## 2015-05-23 ENCOUNTER — Other Ambulatory Visit: Payer: Self-pay | Admitting: Internal Medicine

## 2015-05-27 ENCOUNTER — Other Ambulatory Visit: Payer: Self-pay | Admitting: Internal Medicine

## 2015-06-14 ENCOUNTER — Other Ambulatory Visit: Payer: Self-pay | Admitting: Internal Medicine

## 2015-06-18 ENCOUNTER — Other Ambulatory Visit: Payer: Self-pay | Admitting: Physician Assistant

## 2015-07-03 ENCOUNTER — Ambulatory Visit (INDEPENDENT_AMBULATORY_CARE_PROVIDER_SITE_OTHER): Payer: 59 | Admitting: Physician Assistant

## 2015-07-03 ENCOUNTER — Encounter: Payer: Self-pay | Admitting: Physician Assistant

## 2015-07-03 VITALS — BP 126/80 | HR 75 | Temp 97.7°F | Resp 16 | Ht 60.5 in | Wt 110.8 lb

## 2015-07-03 DIAGNOSIS — J019 Acute sinusitis, unspecified: Secondary | ICD-10-CM | POA: Diagnosis not present

## 2015-07-03 DIAGNOSIS — H04123 Dry eye syndrome of bilateral lacrimal glands: Secondary | ICD-10-CM | POA: Diagnosis not present

## 2015-07-03 MED ORDER — LEVOFLOXACIN 500 MG PO TABS: 500.0000 mg | ORAL_TABLET | Freq: Every day | ORAL | Status: DC

## 2015-07-03 MED ORDER — PREDNISONE 20 MG PO TABS: ORAL_TABLET | ORAL | Status: DC

## 2015-07-03 NOTE — Progress Notes (Signed)
Subjective:    Patient ID: Alexis Weaver, female    DOB: 1951/03/13, 65 y.o.   MRN: [id number]  HPI 65 y.o. WF with sinus infection x 1 month. Left eye pain, sinus pressure/drainge with yellow mucus, teeth pain, fatigue, denies fever, chills. She is on saline spray, flonase, singulair. Also complains of dry eyes, worsening dry mouth, better with prednisone.   Blood pressure 126/80, pulse 75, temperature 97.7 F (36.5 C), temperature source Temporal, resp. rate 16, height 5' 0.5" (1.537 m), weight 110 lb 12.8 oz (50.259 kg), SpO2 96 %.  Past Medical History  Diagnosis Date  . Asthma   . Chronic sinusitis   . Anxiety   . Depression   . Osteopenia    Current Outpatient Prescriptions on File Prior to Visit  Medication Sig Dispense Refill  . ALPRAZolam (XANAX) 0.25 MG tablet take 1 tablet by mouth three times a day if needed for anxiety 90 tablet 5  . AMITIZA 24 MCG capsule take 1 capsule by mouth twice a day 60 capsule 3  . aspirin 81 MG tablet Take 81 mg by mouth daily.      . AXERT 12.5 MG tablet take 1 tablet by mouth if needed for MIGRAINE.   may repeat if needed   (MAXIMUM OF 2 TABLETS IN 24 HOURS) 12 tablet 2  . bimatoprost (LATISSE) 0.03 % ophthalmic solution Place one drop on applicator and apply evenly along the skin of the upper eyelid at base of eyelashes once daily at bedtime; repeat procedure for second eye (use a clean applicator). 3 mL 11  . cycloSPORINE (RESTASIS) 0.05 % ophthalmic emulsion Place 1 drop into both eyes 2 (two) times daily.      Marland Kitchen dextroamphetamine (DEXEDRINE SPANSULE) 10 MG 24 hr capsule Take 10 mg by mouth daily.   0  . Diphenhyd-Hydrocort-Nystatin (FIRST-DUKES MOUTHWASH) SUSP 1 tsp swish and swallow q 2 hours as needed 240 mL 1  . fluconazole (DIFLUCAN) 150 MG tablet Take 1 tablet (150 mg total) by mouth daily. 1 tablet 3  . fluticasone (FLONASE) 50 MCG/ACT nasal spray Place 2 sprays into both nostrils daily.    . Fluticasone-Salmeterol (ADVAIR) 250-50  MCG/DOSE AEPB Inhale 1 puff into the lungs 2 (two) times daily as needed. For shortness of breath 60 each 11  . loratadine-pseudoephedrine (CLARITIN-D 24-HOUR) 10-240 MG per 24 hr tablet Take 1 tablet by mouth daily.    . magnesium gluconate (MAGONATE) 500 MG tablet Take 500 mg by mouth daily.    . montelukast (SINGULAIR) 10 MG tablet take 1 tablet by mouth once daily 30 tablet 99  . NEXIUM 40 MG capsule TAKE ONE CAPSULE EVERY DAY 30 capsule 11  . ondansetron (ZOFRAN) 8 MG tablet Take 1 tablet (8 mg total) by mouth every 8 (eight) hours as needed for nausea or vomiting. 100 tablet 3  . predniSONE (DELTASONE) 20 MG tablet 3 tabs po day one, then 2 tabs daily x 4 days 11 tablet 0  . RELPAX 40 MG tablet take 1 tablet by mouth IMMEDIATELY, and May repeat one time after 2 hours (MAXIMUM OF 2 TABLETS IN 24 HOURS) 6 tablet 1  . SAVELLA 100 MG TABS tablet take 1 to 2 tablets by mouth once daily as directed for MOOD and depression 60 tablet 97  . triamcinolone cream (KENALOG) 0.1 % Apply 1 application topically 2 (two) times daily. 80 g 0  . triamterene-hydrochlorothiazide (MAXZIDE) 75-50 MG per tablet take 1 tablet by mouth once daily  for blood pressure and fluid retention 34 tablet PRN  . VITAMIN D, ERGOCALCIFEROL, PO Take by mouth. 5,000 IU daily    . VIVELLE-DOT 0.05 MG/24HR patch apply 1 patch two times a week 8 patch PRN   No current facility-administered medications on file prior to visit.    Review of Systems  Constitutional: Negative for chills and diaphoresis.  HENT: Positive for congestion, postnasal drip, sinus pressure and sneezing. Negative for ear pain and sore throat.   Respiratory: Positive for cough. Negative for chest tightness, shortness of breath and wheezing.   Cardiovascular: Negative.   Gastrointestinal: Negative.   Genitourinary: Negative.   Musculoskeletal: Negative for neck pain.  Neurological: Positive for headaches.       Objective:   Physical Exam  Constitutional:  She appears well-developed and well-nourished.  HENT:  Head: Normocephalic and atraumatic.  Right Ear: External ear normal. Tympanic membrane is not erythematous and not retracted. A middle ear effusion is present.  Left Ear: Tympanic membrane is not erythematous and not retracted. A middle ear effusion is present.  Nose: Right sinus exhibits maxillary sinus tenderness. Right sinus exhibits no frontal sinus tenderness. Left sinus exhibits maxillary sinus tenderness (worse). Left sinus exhibits no frontal sinus tenderness.  Eyes: Conjunctivae and EOM are normal.  Dry eyes  Neck: Normal range of motion. Neck supple.  Cardiovascular: Normal rate, regular rhythm, normal heart sounds and intact distal pulses.   Pulmonary/Chest: Effort normal and breath sounds normal. No respiratory distress. She has no wheezes.  Abdominal: Soft. Bowel sounds are normal.  Lymphadenopathy:    She has cervical adenopathy.  Skin: Skin is warm and dry.        Assessment & Plan:  1. Acute sinusitis, recurrence not specified, unspecified location - levofloxacin (LEVAQUIN) 500 MG tablet; Take 1 tablet (500 mg total) by mouth daily.  Dispense: 10 tablet; Refill: 0 - predniSONE (DELTASONE) 20 MG tablet; 2 tablets daily for 3 days, 1 tablet daily for 4 days.  Dispense: 10 tablet; Refill: 0  2. Dry eye syndrome, bilateral With dry eyes/dry mouth with response to prednisone, will rule out autoimmune - ANA - Sjogrens syndrome-A extractable nuclear antibody - Sjogrens syndrome-B extractable nuclear antibody

## 2015-07-03 NOTE — Patient Instructions (Signed)
Sinusitis can be uncomfortable. People with sinusitis have congestion with yellow/green/gray discharge, sinus pain/pressure, pain around the eyes. Sinus infections almost ALWAYS stem from a viral infection and antibiotics don't work against a virus. Even when bacteria is responsible, the infections usually clear up on their own in a week or so.   PLEASE TRY TO DO OVER THE COUNTER TREATMENT AND PREDNISONE FOR 5-7 DAYS AND IF YOU ARE NOT GETTING BETTER OR GETTING WORSE THEN YOU CAN START ON AN ANTIBIOTIC GIVEN.  Can take the prednisone AT NIGHT WITH DINNER, it take 8-12 hours to start working so it will NOT affect your sleeping if you take it at night with your food!! Take two pills the first night and 1 or two pill the second night and then 1 pill the other nights.   Risk of antibiotic use: About 1 in 4 people who take antibiotics have side effects including stomach problems, dizziness, or rashes. Those problems clear up soon after stopping the drugs, but in rare cases antibiotics can cause severe allergic reaction. Over use of antibiotics also encourages the growth of bacteria that can't be controlled easily with drugs. That makes you more vunerable to antibiotic-resistant infections and undermines the benefits of antibiotics for others.   Waste of Money: Antibiotics often aren't very expensive, but any money spent on unnecessary drugs is money down the drain.   When are antibiotics needed? Only when symptoms last longer than a week.  Start to improve but then worsen again  -It can take up to 2 weeks to feel better.   -If you do not get better in 7-10 days (Have fever, facial pain, dental pain and swelling), then please call the office and it is now appropriate to start an antibiotic.   -Please take Tylenol or Ibuprofen for pain. -Acetaminiphen 325mg orally every 4-6 hours for pain.  Max: 10 per day -Ibuprofen 200mg orally every 6-8 hours for pain.  Take with food to avoid ulcers.   Max 10 per  day  Please pick one of the over the counter allergy medications below and take it once daily for allergies.  Claritin or loratadine cheapest but likely the weakest  Zyrtec or certizine at night because it can make you sleepy The strongest is allegra or fexafinadine  Cheapest at [employer], [employer], [employer]  -While drinking fluids, pinch and hold nose close and swallow.  This will help open up your eustachian tubes to drain the fluid behind your ear drums. -Try steam showers to open your nasal passages.   Drink lots of water to stay hydrated and to thin mucous.  Flonase/Nasonex is to help the inflammation.  Take 2 sprays in each nostril at bedtime.  Make sure you spray towards the outside of each nostril towards the outer corner of your eye, hold nose close and tilt head back.  This will help the medication get into your sinuses.  If you do not like this medication, then use saline nasal sprays same directions as above for Flonase. Stop the medication right away if you get blurring of your vision or nose bleeds.  Sinusitis Sinusitis is redness, soreness, and inflammation of the paranasal sinuses. Paranasal sinuses are air pockets within the bones of your face (beneath the eyes, the middle of the forehead, or above the eyes). In healthy paranasal sinuses, mucus is able to drain out, and air is able to circulate through them by way of your nose. However, when your paranasal sinuses are inflamed, mucus and air can   become trapped. This can allow bacteria and other germs to grow and cause infection. Sinusitis can develop quickly and last only a short time (acute) or continue over a long period (chronic). Sinusitis that lasts for more than 12 weeks is considered chronic.  CAUSES  Causes of sinusitis include: Allergies. Structural abnormalities, such as displacement of the cartilage that separates your nostrils (deviated septum), which can decrease the air flow through your nose and sinuses and affect sinus  drainage. Functional abnormalities, such as when the small hairs (cilia) that line your sinuses and help remove mucus do not work properly or are not present. SIGNS AND SYMPTOMS  Symptoms of acute and chronic sinusitis are the same. The primary symptoms are pain and pressure around the affected sinuses. Other symptoms include: Upper toothache. Earache. Headache. Bad breath. Decreased sense of smell and taste. A cough, which worsens when you are lying flat. Fatigue. Fever. Thick drainage from your nose, which often is green and may contain pus (purulent). Swelling and warmth over the affected sinuses. DIAGNOSIS  Your health care provider will perform a physical exam. During the exam, your health care provider may: Look in your nose for signs of abnormal growths in your nostrils (nasal polyps).  Tap over the affected sinus to check for signs of infection. View the inside of your sinuses (endoscopy) using an imaging device that has a light attached (endoscope). If your health care provider suspects that you have chronic sinusitis, one or more of the following tests may be recommended: Allergy tests. Nasal culture. A sample of mucus is taken from your nose, sent to a lab, and screened for bacteria. Nasal cytology. A sample of mucus is taken from your nose and examined by your health care provider to determine if your sinusitis is related to an allergy. TREATMENT  Most cases of acute sinusitis are related to a viral infection and will resolve on their own within 10 days. Sometimes medicines are prescribed to help relieve symptoms (pain medicine, decongestants, nasal steroid sprays, or saline sprays).  However, for sinusitis related to a bacterial infection, your health care provider will prescribe antibiotic medicines. These are medicines that will help kill the bacteria causing the infection.  Rarely, sinusitis is caused by a fungal infection. In theses cases, your health care provider will  prescribe antifungal medicine. For some cases of chronic sinusitis, surgery is needed. Generally, these are cases in which sinusitis recurs more than 3 times per year, despite other treatments. HOME CARE INSTRUCTIONS  Drink plenty of water. Water helps thin the mucus so your sinuses can drain more easily. Use a humidifier. Inhale steam 3 to 4 times a day (for example, sit in the bathroom with the shower running). Apply a warm, moist washcloth to your face 3 to 4 times a day, or as directed by your health care provider. Use saline nasal sprays to help moisten and clean your sinuses. Take medicines only as directed by your health care provider. If you were prescribed either an antibiotic or antifungal medicine, finish it all even if you start to feel better. SEEK IMMEDIATE MEDICAL CARE IF: You have increasing pain or severe headaches. You have nausea, vomiting, or drowsiness. You have swelling around your face. You have vision problems. You have a stiff neck. You have difficulty breathing. MAKE SURE YOU:  Understand these instructions. Will watch your condition. Will get help right away if you are not doing well or get worse. Document Released: 06/01/2005 Document Revised: 10/16/2013 Document Reviewed: 06/16/2011 ExitCare   Patient Information 2015 [place], [place]. This information is not intended to replace advice given to you by your health care provider. Make sure you discuss any questions you have with your health care provider.    Sjgren Syndrome Sjgren syndrome is a disease in which the body's natural defense system (immune system) turns against the body's own cells and attacks the glands that produce tears and saliva. Sjgren syndrome is sometimes linked to rheumatic disorders, such as rheumatoid arthritis and lupus. It is 10 times more common in women than in men. Most people with Sjgren syndrome are from 5 to 32 years of age. CAUSES  The cause is unknown. It runs in families. It  is possible that a trigger, such as a viral infection, can set it off. SYMPTOMS  The main symptoms are:  Dry mouth.  Chalky feeling, mouth feels like it is full of cotton.  Difficulty swallowing, speaking, or tasting.  Prone to cavities and mouth infections.  Dry eyes.  Burning, itching, feels like sand in the eyes.  Blurry vision.  Light sensitive. Other symptoms may include:  Skin, nose, and vaginal dryness.  Joint pain, stiffness, and muscle pain. Other organs that may be affected include:  Kidneys.  Blood vessels.  Lungs.  Liver.  Pancreas.  Brain. DIAGNOSIS  Diagnosis is first based on symptoms, medical history, and physical exam. Tests may also be done, including:  Tear production test (Schirmer test).  A thorough eye exam using a magnifying device (slit-lamp exam).  Test to see the extent of eye damage using dye staining.  Mouth exam to look for signs of salivary gland swelling and mouth dryness.  Removal of a minor salivary gland from inside the lower lip to be studied under a microscope (lip biopsy).  Blood tests. Routine and special blood studies will be checked. Antibodies that attack normal tissue (autoantibodies) may be present, such as antinuclear antibodies (ANAs), rheumatoid factors, and Sjgren antibodies (anti-SSA and anti-SSB).  Chest X-ray.  Urine tests (urinalysis). TREATMENT  There is no known cure for this syndrome. There is no specific treatment to restore gland secretion, either. Treatment varies and is generally based upon the problems present.  Moisture replacement therapies may ease the symptoms of dryness.  Nonsteroidal anti-inflammatory drugs (NSAIDs), such as ibuprofen, may be used to treat musculoskeletal symptoms.  Corticosteroids, such as prednisone, may be given for people with severe complications.  Immunosuppressive drugs may be prescribed to control overactivity of the immune system. In severe cases, this  overactivity can lead to organ damage.  A surgical procedure called punctal occlusion may be considered. This is done to close the tear ducts, helping to keep more natural tears on the eye's surface. A small percentage of people with Sjgren syndrome develop lymphoma. If you are worried that you might develop lymphoma, talk to your caregiver to learn more about the disease and the symptoms to watch. HOME CARE INSTRUCTIONS   Eye care:  Use eye drops as specified by your caregiver.  Try to blink 5 to 6 times per minute.  Protect your eyes from drafts and breezes.  Maintain properly humidified air.  Avoid smoke.  Mouth care:  Sugar-free gum and hard candy can help in some people.  Take frequent sips of water or sugar-free drinks.  Use lip balm, saliva substitutes, and prescription medicines as directed. SEEK MEDICAL CARE IF:   You have an oral temperature above 102 F (38.9 C).  You have night sweats.  You develop constant fatigue.  You  have unexplained weight loss.  You develop itchy skin.  You have reddened patches on the skin. FOR MORE INFORMATION  Sjgren's Syndrome Foundation: www.sjogrens.[facility]: www.niams.http://www.myers.net/   This information is not intended to replace advice given to you by your health care provider. Make sure you discuss any questions you have with your health care provider.   Document Released: 05/22/2002 Document Revised: 02/20/2015 Document Reviewed: 02/07/2015 Elsevier Interactive Patient Education [employer]

## 2015-07-04 LAB — SJOGRENS SYNDROME-A EXTRACTABLE NUCLEAR ANTIBODY: SSA (RO) (ENA) ANTIBODY, IGG: NEGATIVE

## 2015-07-04 LAB — SJOGRENS SYNDROME-B EXTRACTABLE NUCLEAR ANTIBODY: SSB (LA) (ENA) ANTIBODY, IGG: NEGATIVE

## 2015-07-04 LAB — ANA: ANA: NEGATIVE

## 2015-07-21 ENCOUNTER — Encounter: Payer: Self-pay | Admitting: Internal Medicine

## 2015-07-21 DIAGNOSIS — D649 Anemia, unspecified: Secondary | ICD-10-CM

## 2015-07-22 ENCOUNTER — Ambulatory Visit (INDEPENDENT_AMBULATORY_CARE_PROVIDER_SITE_OTHER): Payer: 59 | Admitting: Internal Medicine

## 2015-07-22 ENCOUNTER — Ambulatory Visit: Payer: Self-pay | Admitting: Physician Assistant

## 2015-07-22 ENCOUNTER — Encounter: Payer: Self-pay | Admitting: Internal Medicine

## 2015-07-22 VITALS — BP 120/70 | HR 68 | Temp 97.9°F | Resp 16 | Ht 60.5 in | Wt 106.4 lb

## 2015-07-22 DIAGNOSIS — E559 Vitamin D deficiency, unspecified: Secondary | ICD-10-CM

## 2015-07-22 DIAGNOSIS — D649 Anemia, unspecified: Secondary | ICD-10-CM

## 2015-07-22 DIAGNOSIS — Z79899 Other long term (current) drug therapy: Secondary | ICD-10-CM

## 2015-07-22 DIAGNOSIS — R002 Palpitations: Secondary | ICD-10-CM | POA: Diagnosis not present

## 2015-07-22 DIAGNOSIS — R7303 Prediabetes: Secondary | ICD-10-CM | POA: Diagnosis not present

## 2015-07-22 LAB — CBC WITH DIFFERENTIAL/PLATELET
BASOS ABS: 0.1 10*3/uL (ref 0.0–0.1)
Basophils Relative: 2 % — ABNORMAL HIGH (ref 0–1)
EOS PCT: 4 % (ref 0–5)
Eosinophils Absolute: 0.2 10*3/uL (ref 0.0–0.7)
HEMATOCRIT: 39 % (ref 36.0–46.0)
HEMOGLOBIN: 13.3 g/dL (ref 12.0–15.0)
LYMPHS ABS: 1.6 10*3/uL (ref 0.7–4.0)
LYMPHS PCT: 37 % (ref 12–46)
MCH: 31.4 pg (ref 26.0–34.0)
MCHC: 34.1 g/dL (ref 30.0–36.0)
MCV: 92.2 fL (ref 78.0–100.0)
MPV: 9.7 fL (ref 8.6–12.4)
Monocytes Absolute: 0.3 10*3/uL (ref 0.1–1.0)
Monocytes Relative: 8 % (ref 3–12)
NEUTROS ABS: 2.1 10*3/uL (ref 1.7–7.7)
NEUTROS PCT: 49 % (ref 43–77)
Platelets: 286 10*3/uL (ref 150–400)
RBC: 4.23 MIL/uL (ref 3.87–5.11)
RDW: 14.1 % (ref 11.5–15.5)
WBC: 4.2 10*3/uL (ref 4.0–10.5)

## 2015-07-22 LAB — BASIC METABOLIC PANEL WITH GFR
BUN: 12 mg/dL (ref 7–25)
CHLORIDE: 98 mmol/L (ref 98–110)
CO2: 31 mmol/L (ref 20–31)
CREATININE: 0.67 mg/dL (ref 0.50–0.99)
Calcium: 9 mg/dL (ref 8.6–10.4)
GFR, Est African American: >89 mL/min (ref >=60)
GFR, Est Non African American: >89 mL/min (ref >=60)
GLUCOSE: 93 mg/dL (ref 65–99)
Potassium: 3.6 mmol/L (ref 3.5–5.3)
Sodium: 138 mmol/L (ref 135–146)

## 2015-07-22 LAB — IRON AND TIBC
%SAT: 25 % (ref 11–50)
IRON: 103 ug/dL (ref 45–160)
TIBC: 413 ug/dL (ref 250–450)
UIBC: 310 ug/dL (ref 125–400)

## 2015-07-22 LAB — TSH: TSH: 1.46 mIU/L

## 2015-07-22 LAB — HEPATIC FUNCTION PANEL
ALBUMIN: 4 g/dL (ref 3.6–5.1)
ALK PHOS: 55 U/L (ref 33–130)
ALT: 18 U/L (ref 6–29)
AST: 22 U/L (ref 10–35)
Bilirubin, Direct: 0.1 mg/dL (ref <=0.2)
Indirect Bilirubin: 0.6 mg/dL (ref 0.2–1.2)
Total Bilirubin: 0.7 mg/dL (ref 0.2–1.2)
Total Protein: 6.5 g/dL (ref 6.1–8.1)

## 2015-07-22 LAB — VITAMIN B12: VITAMIN B 12: 1573 pg/mL — AB (ref 200–1100)

## 2015-07-22 LAB — RETICULOCYTES
ABS Retic: 38.1 10*3/uL (ref 19.0–186.0)
RBC.: 4.23 MIL/uL (ref 3.87–5.11)
Retic Ct Pct: 0.9 % (ref 0.4–2.3)

## 2015-07-22 LAB — HEMOGLOBIN A1C
Hgb A1c MFr Bld: 6 % — ABNORMAL HIGH (ref <5.7)
Mean Plasma Glucose: 126 mg/dL — ABNORMAL HIGH (ref <117)

## 2015-07-22 LAB — FERRITIN: Ferritin: 14 ng/mL — ABNORMAL LOW (ref 20–288)

## 2015-07-22 LAB — MAGNESIUM: MAGNESIUM: 1.7 mg/dL (ref 1.5–2.5)

## 2015-07-22 NOTE — Progress Notes (Signed)
Patient ID: Alexis Weaver, female   DOB: [birth year], 65 y.o.   MRN: [id number]   This very nice 65 y.o. MWF presents for 6 month follow up with hx/o Palpitations, Hyperlipidemia screening, Pre-Diabetes and Vitamin D Deficiency. Patient also has hx/o Depression which is controlled on current meds. Other problems include GERD controlled with Nrexium and IBS-C controlled with Amitiza.   Patient is monitored expectantly for labile HTN & BP has been controlled and today's BP: 120/70 mmHg. She does admit hx/o palpitations which are felt to be related to anxiety.  Patient has had no complaints of any cardiac type chest pain, dyspnea/orthopnea/PND, dizziness, claudication, or dependent edema.   Hyperlipidemia is controlled with diet. Patient denies myalgias or other med SE's. Last Lipids were at goal with  Cholesterol 168; HDL 84; LDL 75; Triglycerides 44 on 01/16/2015.    Also, the patient has history of PreDiabetes (BMI 20.43 !) predating since Aug 2015 with A1c 5.9% and has had no symptoms of reactive hypoglycemia, diabetic polys, paresthesias or visual blurring.  Last A1c was 6.0% on 01/16/2015.   Further, the patient also has history of Vitamin D Deficiency and supplements vitamin D without any suspected side-effects. Last vitamin D was 40 on 01/16/2015.  Medication Sig  . ALPRAZolam  0.25 MG tablet take 1 tablet by mouth three times a day if needed for anxiety  . AMITIZA 24 MCG  take 1 capsule by mouth twice a day  . aspirin 81 MG Take 81 mg by mouth daily.    . AXERT 12.5 MG take 1 tablet by mouth if needed for MIGRAINE.   may repeat if needed   (MAXIMUM OF 2 TABLETS IN 24 HOURS)  . bimatoprost (LATISSE) 0.03 % ophth soln Place one drop on applicator and apply evenly along the skin of the upper eyelid at base of eyelashes once daily at bedtime; repeat procedure for second eye (use a clean applicator).  . RESTASIS 0.05 % ophth emulsion Place 1 drop into both eyes 2 (two) times daily.    Marland Kitchen  dextroamphetamine (DEXEDRINE SPANSULE) 10 MG 24 hr  Take 10 mg by mouth daily.   Marland Kitchen FLONASE nasal spray Place 2 sprays into both nostrils daily.  Marland Kitchen ADVAIR 250-50  Inhale 1 puff into the lungs 2 (two) times daily as needed. For shortness of breath  . magnesium  500 MG  Take 500 mg by mouth daily.  . montelukast 10 MG take 1 tablet by mouth once daily  . ondansetron  8 MG Take 1 tablet (8 mg total) by mouth every 8 (eight) hours as needed for nausea or vomiting.  Marland Kitchen RELPAX 40 MG  take 1 tablet by mouth IMMEDIATELY, and May repeat one time after 2 hours (MAXIMUM OF 2 TABLETS IN 24 HOURS)  . SAVELLA 100 MG take 1 to 2 tablets by mouth once daily as directed for MOOD and depression  . triamterene-hctz (MAXZIDE) 75-50  take 1 tablet by mouth once daily for blood pressure and fluid retention  . VITAMIN D Take by mouth. 5,000 IU daily  . VIVELLE-DOT 0.05 MG/24HR apply 1 patch two times a week  . NEXIUM 40 MG TAKE ONE CAPSULE EVERY DAY (Patient not taking: Reported on 07/22/2015)   Allergies  Allergen Reactions  . Hydrocodone-Acetaminophen Shortness Of Breath  . Codeine Hives  . Morphine And Related Hives  . Nitrofurantoin Other (See Comments)    Makes hands and feet burn   PMHx:   Past Medical History  Diagnosis  Date  . Asthma   . Chronic sinusitis   . Anxiety   . Depression   . Osteopenia    Immunization History  Administered Date(s) Administered  . DT 11/06/2004  . Influenza-Unspecified 02/14/2012  . Tdap 02/26/2011   Past Surgical History  Procedure Laterality Date  . Abdominal hysterectomy    . Cesarian    . Breast enhancement surgery     FHx:    Reviewed / unchanged  SHx:    Reviewed / unchanged  Systems Review:  Constitutional: Denies fever, chills, wt changes, headaches, insomnia, fatigue, night sweats, change in appetite. Eyes: Denies redness, blurred vision, diplopia, discharge, itchy, watery eyes.  ENT: Denies discharge, congestion, post nasal drip, epistaxis, sore  throat, earache, hearing loss, dental pain, tinnitus, vertigo, sinus pain, snoring.  CV: Denies chest pain, palpitations, irregular heartbeat, syncope, dyspnea, diaphoresis, orthopnea, PND, claudication or edema. Respiratory: denies cough, dyspnea, DOE, pleurisy, hoarseness, laryngitis, wheezing.  Gastrointestinal: Denies dysphagia, odynophagia, heartburn, reflux, water brash, abdominal pain or cramps, nausea, vomiting, bloating, diarrhea, constipation, hematemesis, melena, hematochezia  or hemorrhoids. Genitourinary: Denies dysuria, frequency, urgency, nocturia, hesitancy, discharge, hematuria or flank pain. Musculoskeletal: Denies  myalgias, stiffness, jt. swelling, pain, limping or strain/sprain. Does c/o pain at base of R thumb and is encouraged to try Tumeric/Bioperine for this. Skin: Denies pruritus, rash, hives, warts, acne, eczema or change in skin lesion(s). Neuro: No weakness, tremor, incoordination, spasms, paresthesia or pain. Psychiatric: Denies confusion, memory loss or sensory loss. Endo: Denies change in weight, skin or hair change.  Heme/Lymph: No excessive bleeding, bruising or enlarged lymph nodes.  Physical Exam  BP 120/70 mmHg  Pulse 68  Temp(Src) 97.9 F (36.6 C) (Temporal)  Resp 16  Ht 5' 0.5" (1.537 m)  Wt 106 lb 6.4 oz (48.263 kg)  BMI 20.43 kg/m2  SpO2 98%  Appears well nourished and in no distress. Eyes: PERRLA, EOMs, conjunctiva no swelling or erythema. Sinuses: No frontal/maxillary tenderness ENT/Mouth: EAC's clear, TM's nl w/o erythema, bulging. Nares clear w/o erythema, swelling, exudates. Oropharynx clear without erythema or exudates. Oral hygiene is good. Tongue normal, non obstructing. Hearing intact.  Neck: Supple. Thyroid nl. Car 2+/2+ without bruits, nodes or JVD. Chest: Respirations nl with BS clear & equal w/o rales, rhonchi, wheezing or stridor.  Cor: Heart sounds normal w/ regular rate and rhythm without sig. murmurs, gallops, clicks, or rubs.  Peripheral pulses normal and equal  without edema.  Abdomen: Soft & bowel sounds normal. Non-tender w/o guarding, rebound, hernias, masses, or organomegaly.  Lymphatics: Unremarkable.  Musculoskeletal: Full ROM all peripheral extremities, joint stability, 5/5 strength, and normal gait.  Skin: Warm, dry without exposed rashes, lesions or ecchymosis apparent.  Neuro: Cranial nerves intact, reflexes equal bilaterally. Sensory-motor testing grossly intact. Tendon reflexes grossly intact.  Pysch: Alert & oriented x 3.  Insight and judgement nl & appropriate. No ideations.  Assessment and Plan:  1. Palpitations  - TSH  2. Prediabetes  - Hemoglobin A1c - Insulin, random  3. Vitamin D deficiency  - VITAMIN D 25 Hydroxy   4. Anemia, unspecified anemia type  - Ferritin - Reticulocytes - Vitamin B12 - Iron and TIBC  5. Medication management  - CBC with Differential/Platelet - BASIC METABOLIC PANEL WITH GFR - Hepatic function panel - Magnesium   Recommended regular exercise, BP monitoring, weight control, and discussed med and SE's. Recommended labs to assess and monitor clinical status. Further disposition pending results of labs. Over 30 minutes of exam, counseling, chart review was performed

## 2015-07-22 NOTE — Patient Instructions (Signed)
Tumeric  900 mg   w/Bioperine (Extract of fruit of black pepper)   Take 2 x / daily   Can get at www. BombTimer.gl  Recommend Adult Low Dose Aspirin or   coated  Aspirin 81 mg daily   To reduce risk of Colon Cancer 20 %,   Skin Cancer 26 % ,   Melanoma 46%   and   Pancreatic cancer 60%   ++++++++++++++++++++++++++++++++++++++++++++++++++++++ Vitamin D goal   is between 70-100.   Please make sure that you are taking your Vitamin D as directed.   It is very important as a natural anti-inflammatory   helping hair, skin, and nails, as well as reducing stroke and heart attack risk.   It helps your bones and helps with mood.  It also decreases numerous cancer risks so please take it as directed.   Low Vit D is associated with a 200-300% higher risk for CANCER   and 200-300% higher risk for HEART   ATTACK  &  STROKE.   .....................................Marland Kitchen  It is also associated with higher death rate at younger ages,   autoimmune diseases like Rheumatoid arthritis, Lupus, Multiple Sclerosis.     Also many other serious conditions, like depression, Alzheimer's  Dementia, infertility, muscle aches, fatigue, fibromyalgia - just to name a few.  ++++++++++++++++++++++++++++++++++++++++++++++++  Recommend the book "The END of DIETING" by Dr Monico Hoar   & the book "The END of DIABETES " by Dr Monico Hoar  At [email or web address] - get book & Audio CD's     Being diabetic has a  300% increased risk for heart attack, stroke, cancer, and alzheimer- type vascular dementia. It is very important that you work harder with diet by avoiding all foods that are white. Avoid white rice (brown & wild rice is OK), white potatoes (sweetpotatoes in moderation is OK), White bread or wheat bread or anything made out of white flour like bagels, donuts, rolls, buns, biscuits, cakes, pastries, cookies, pizza crust, and pasta (made from white flour & egg whites) - vegetarian pasta or spinach  or wheat pasta is OK. Multigrain breads like Arnold's or Pepperidge Farm, or multigrain sandwich thins or flatbreads.  Diet, exercise and weight loss can reverse and cure diabetes in the early stages.  Diet, exercise and weight loss is very important in the control and prevention of complications of diabetes which affects every system in your body, ie. Brain - dementia/stroke, eyes - glaucoma/blindness, heart - heart attack/heart failure, kidneys - dialysis, stomach - gastric paralysis, intestines - malabsorption, nerves - severe painful neuritis, circulation - gangrene & loss of a leg(s), and finally cancer and Alzheimers.    I recommend avoid fried & greasy foods,  sweets/candy, white rice (brown or wild rice or Quinoa is OK), white potatoes (sweet potatoes are OK) - anything made from white flour - bagels, doughnuts, rolls, buns, biscuits,white and wheat breads, pizza crust and traditional pasta made of white flour & egg white(vegetarian pasta or spinach or wheat pasta is OK).  Multi-grain bread is OK - like multi-grain flat bread or sandwich thins. Avoid alcohol in excess. Exercise is also important.    Eat all the vegetables you want - avoid meat, especially red meat and dairy - especially cheese.  Cheese is the most concentrated form of trans-fats which is the worst thing to clog up our arteries. Veggie cheese is OK which can be found in the fresh produce section at [employer] or [employer] or [employer]  ++++++++++++++++++++++++++++++++++++++++++++++++++ DASH Eating  Plan  DASH stands for "Dietary Approaches to Stop Hypertension."   The DASH eating plan is a healthy eating plan that has been shown to reduce high blood pressure (hypertension). Additional health benefits may include reducing the risk of type 2 diabetes mellitus, heart disease, and stroke. The DASH eating plan may also help with weight loss.  WHAT DO I NEED TO KNOW ABOUT THE DASH EATING PLAN?  For the DASH eating plan, you will  follow these general guidelines:  Choose foods with a percent daily value for sodium of less than 5% (as listed on the food label).  Use salt-free seasonings or herbs instead of table salt or sea salt.  Check with your health care provider or pharmacist before using salt substitutes.  Eat lower-sodium products, often labeled as "lower sodium" or "no salt added."  Eat fresh foods.  Eat more vegetables, fruits, and low-fat dairy products.    Choose whole grains. Look for the word "whole" as the first word in the ingredient list.  Choose fish   Limit sweets, desserts, sugars, and sugary drinks.  Choose heart-healthy fats.  Eat veggie cheese   Eat more home-cooked food and less restaurant, buffet, and fast food.  Limit fried foods.  Cook foods using methods other than frying.  Limit canned vegetables. If you do use them, rinse them well to decrease the sodium.  When eating at a restaurant, ask that your food be prepared with less salt, or no salt if possible.                      WHAT FOODS CAN I EAT?  Read Dr Francis Dowse Fuhrman's books on The End of Dieting & The End of Diabetes  Grains  Whole grain or whole wheat bread. Brown rice. Whole grain or whole wheat pasta. Quinoa, bulgur, and whole grain cereals. Low-sodium cereals. Corn or whole wheat flour tortillas. Whole grain cornbread. Whole grain crackers. Low-sodium crackers.  Vegetables  Fresh or frozen vegetables (raw, steamed, roasted, or grilled). Low-sodium or reduced-sodium tomato and vegetable juices. Low-sodium or reduced-sodium tomato sauce and paste. Low-sodium or reduced-sodium canned vegetables.   Fruits  All fresh, canned (in natural juice), or frozen fruits.  Protein Products   All fish and seafood.  Dried beans, peas, or lentils. Unsalted nuts and seeds. Unsalted canned beans.  Dairy  Low-fat dairy products, such as skim or 1% milk, 2% or reduced-fat cheeses, low-fat ricotta or cottage cheese, or plain  low-fat yogurt. Low-sodium or reduced-sodium cheeses.  Fats and Oils  Tub margarines without trans fats. Light or reduced-fat mayonnaise and salad dressings (reduced sodium). Avocado. Safflower, olive, or canola oils. Natural peanut or almond butter.  Other  Unsalted popcorn and pretzels. The items listed above may not be a complete list of recommended foods or beverages. Contact your dietitian for more options.  +++++++++++++++++++++++++++++++++++++++++++  WHAT FOODS ARE NOT RECOMMENDED?  Grains/ White flour or wheat flour  White bread. White pasta. White rice. Refined cornbread. Bagels and croissants. Crackers that contain trans fat.  Vegetables  Creamed or fried vegetables. Vegetables in a . Regular canned vegetables. Regular canned tomato sauce and paste. Regular tomato and vegetable juices.  Fruits  Dried fruits. Canned fruit in light or heavy syrup. Fruit juice.  Meat and Other Protein Products  Meat in general - RED mwaet & White meat.  Fatty cuts of meat. Ribs, chicken wings, bacon, sausage, bologna, salami, chitterlings, fatback, hot dogs, bratwurst, and packaged luncheon meats.  Dairy  Whole or 2% milk, cream, half-and-half, and cream cheese. Whole-fat or sweetened yogurt. Full-fat cheeses or blue cheese. Nondairy creamers and whipped toppings. Processed cheese, cheese spreads, or cheese curds.  Condiments  Onion and garlic salt, seasoned salt, table salt, and sea salt. Canned and packaged gravies. Worcestershire sauce. Tartar sauce. Barbecue sauce. Teriyaki sauce. Soy sauce, including reduced sodium. Steak sauce. Fish sauce. Oyster sauce. Cocktail sauce. Horseradish. Ketchup and mustard. Meat flavorings and tenderizers. Bouillon cubes. Hot sauce. Tabasco sauce. Marinades. Taco seasonings. Relishes.  Fats and Oils Butter, stick margarine, lard, shortening and bacon fat. Coconut, palm kernel, or palm oils. Regular salad dressings.  Pickles and olives. Salted popcorn  and pretzels.  The items listed above may not be a complete list of foods and beverages to avoid.

## 2015-07-23 LAB — VITAMIN D 25 HYDROXY (VIT D DEFICIENCY, FRACTURES): Vit D, 25-Hydroxy: 48 ng/mL (ref 30–100)

## 2015-07-23 LAB — INSULIN, RANDOM: INSULIN: 3.4 u[IU]/mL (ref 2.0–19.6)

## 2015-07-31 ENCOUNTER — Ambulatory Visit (INDEPENDENT_AMBULATORY_CARE_PROVIDER_SITE_OTHER): Payer: 59 | Admitting: Physician Assistant

## 2015-07-31 ENCOUNTER — Encounter: Payer: Self-pay | Admitting: Physician Assistant

## 2015-07-31 VITALS — BP 118/76 | HR 82 | Temp 97.9°F | Resp 16 | Ht 60.5 in | Wt 110.6 lb

## 2015-07-31 DIAGNOSIS — K589 Irritable bowel syndrome without diarrhea: Secondary | ICD-10-CM

## 2015-07-31 DIAGNOSIS — K2100 Gastro-esophageal reflux disease with esophagitis, without bleeding: Secondary | ICD-10-CM

## 2015-07-31 MED ORDER — SUCRALFATE 1 G PO TABS: 1.0000 g | ORAL_TABLET | Freq: Three times a day (TID) | ORAL | Status: DC

## 2015-07-31 NOTE — Progress Notes (Signed)
Subjective:    Patient ID: Alexis Weaver, female    DOB: 13-Dec-1950, 65 y.o.   MRN: [id number]  HPI 65 y.o. WF presents with GERD, treated 07/03/2015 with prednisone and levaquin and has been on aleve due to HA, 3rd day of prednisone she stopped it due to chest burning, has gnawing pain that is better with eating, feels like she has lump in her throat, has hoarseness, still with dry cough. She has been nexium  in the AM and did get some relief with tums.  Has never had EGD.   She has chronic IBS-C, is on amitza that helps. Last colonoscopy was 2006, she is due, has been putting off due to her husbands recent back surgery. Last BM Monday, no dark black stools, hard stool.   Blood pressure 118/76, pulse 82, temperature 97.9 F (36.6 C), temperature source Temporal, resp. rate 16, height 5' 0.5" (1.537 m), weight 110 lb 9.6 oz (50.168 kg), SpO2 97 %.  Past Medical History  Diagnosis Date  . Asthma   . Chronic sinusitis   . Anxiety   . Depression   . Osteopenia    Current Outpatient Prescriptions on File Prior to Visit  Medication Sig Dispense Refill  . ALPRAZolam (XANAX) 0.25 MG tablet take 1 tablet by mouth three times a day if needed for anxiety 90 tablet 5  . AMITIZA 24 MCG capsule take 1 capsule by mouth twice a day 60 capsule 3  . aspirin 81 MG tablet Take 81 mg by mouth daily.      . AXERT 12.5 MG tablet take 1 tablet by mouth if needed for MIGRAINE.   may repeat if needed   (MAXIMUM OF 2 TABLETS IN 24 HOURS) 12 tablet 2  . bimatoprost (LATISSE) 0.03 % ophthalmic solution Place one drop on applicator and apply evenly along the skin of the upper eyelid at base of eyelashes once daily at bedtime; repeat procedure for second eye (use a clean applicator). 3 mL 11  . cycloSPORINE (RESTASIS) 0.05 % ophthalmic emulsion Place 1 drop into both eyes 2 (two) times daily.      Marland Kitchen dextroamphetamine (DEXEDRINE SPANSULE) 10 MG 24 hr capsule Take 10 mg by mouth daily.   0  . fluticasone  (FLONASE) 50 MCG/ACT nasal spray Place 2 sprays into both nostrils daily.    . Fluticasone-Salmeterol (ADVAIR) 250-50 MCG/DOSE AEPB Inhale 1 puff into the lungs 2 (two) times daily as needed. For shortness of breath 60 each 11  . magnesium gluconate (MAGONATE) 500 MG tablet Take 500 mg by mouth daily.    . montelukast (SINGULAIR) 10 MG tablet take 1 tablet by mouth once daily 30 tablet 99  . ondansetron (ZOFRAN) 8 MG tablet Take 1 tablet (8 mg total) by mouth every 8 (eight) hours as needed for nausea or vomiting. 100 tablet 3  . RELPAX 40 MG tablet take 1 tablet by mouth IMMEDIATELY, and May repeat one time after 2 hours (MAXIMUM OF 2 TABLETS IN 24 HOURS) 6 tablet 1  . SAVELLA 100 MG TABS tablet take 1 to 2 tablets by mouth once daily as directed for MOOD and depression 60 tablet 97  . triamterene-hydrochlorothiazide (MAXZIDE) 75-50 MG per tablet take 1 tablet by mouth once daily for blood pressure and fluid retention 34 tablet PRN  . VITAMIN D, ERGOCALCIFEROL, PO Take by mouth. 5,000 IU daily    . VIVELLE-DOT 0.05 MG/24HR patch apply 1 patch two times a week 8 patch PRN  No current facility-administered medications on file prior to visit.     Review of Systems  Constitutional: Negative.   HENT: Negative.   Respiratory: Positive for cough. Negative for apnea, choking, chest tightness, shortness of breath, wheezing and stridor.   Cardiovascular: Negative.   Gastrointestinal: Positive for nausea, abdominal pain, constipation and abdominal distention. Negative for vomiting, diarrhea, blood in stool, anal bleeding and rectal pain.       + GERD  Genitourinary: Negative.   Musculoskeletal: Negative.   Skin: Negative.   Neurological: Negative.        Objective:   Physical Exam  Constitutional: She is oriented to person, place, and time. She appears well-developed and well-nourished.  HENT:  Head: Normocephalic and atraumatic.  Right Ear: External ear normal.  Left Ear: External ear  normal.  Mouth/Throat: Oropharynx is clear and moist.  Eyes: Conjunctivae and EOM are normal. Pupils are equal, round, and reactive to light.  Neck: Normal range of motion. Neck supple. No thyromegaly present.  Cardiovascular: Normal rate, regular rhythm and normal heart sounds.  Exam reveals no gallop and no friction rub.   No murmur heard. Pulmonary/Chest: Effort normal and breath sounds normal. No respiratory distress. She has no wheezes.  Abdominal: Soft. Bowel sounds are normal. She exhibits no shifting dullness, no distension, no abdominal bruit, no pulsatile midline mass and no mass. There is no hepatosplenomegaly. There is tenderness in the epigastric area. There is guarding. There is no rigidity, no rebound, no CVA tenderness, no tenderness at McBurney's point and negative Murphy's sign. No hernia.  Musculoskeletal: Normal range of motion.  Lymphadenopathy:    She has no cervical adenopathy.  Neurological: She is alert and oriented to person, place, and time.  Skin: Skin is warm and dry.  Psychiatric: She has a normal mood and affect.       Assessment & Plan:  GERD/possible NSAID induced ulcer Dexilant samples in the AM, tagamet at night, carafate PRN Follow up with GI for EGD/colonoscopy  IBS-C Continue amitiza, benign AB, can add miralax especially with adding carafate

## 2015-07-31 NOTE — Patient Instructions (Addendum)
Take the dexilant in the morning and can take tagamet at night Can take carafate, whole pill or can dissolve in water and drink 20 mins before meals as needed.  Mirlax 17g in water daily Call Dr. Russella Dar  About Constipation  Constipation Overview Constipation is the most common gastrointestinal complaint - about 4 million Americans experience constipation and make 2.5 million physician visits a year to get help for the problem.  Constipation can occur when the colon absorbs too much water, the colon's muscle contraction is slow or sluggish, and/or there is delayed transit time through the colon.  The result is stool that is hard and dry.  Indicators of constipation include straining during bowel movements greater than 25% of the time, having fewer than three bowel movements per week, and/or the feeling of incomplete evacuation.  There are established guidelines ([place] II ) for defining constipation. A person needs to have two or more of the following symptoms for at least 12 weeks (not necessarily consecutive) in the preceding 12 months: . Straining in  greater than 25% of bowel movements . Lumpy or hard stools in greater than 25% of bowel movements . Sensation of incomplete emptying in greater than 25% of bowel movements . Sensation of anorectal obstruction/blockade in greater than 25% of bowel movements . Manual maneuvers to help empty greater than 25% of bowel movements (e.g., digital evacuation, support of the pelvic floor)  . Less than  3 bowel movements/week . Loose stools are not present, and criteria for irritable bowel syndrome are insufficient  Common Causes of Constipation . Lack of fiber in your diet . Lack of physical activity . Medications, including iron and calcium supplements  . Dairy intake . Dehydration . Abuse of laxatives  Travel  Irritable Bowel Syndrome  Pregnancy  Luteal phase of menstruation (after ovulation and before menses)  Colorectal  problems  Intestinal Dysfunction  Treating Constipation  There are several ways of treating constipation, including changes to diet and exercise, use of laxatives, adjustments to the pelvic floor, and scheduled toileting.  These treatments include: . increasing fiber and fluids in the diet  . increasing physical activity . learning muscle coordination   learning proper toileting techniques and toileting modifications   designing and sticking  to a toileting schedule     2007, [employer] Doc.22  Food Choices for Gastroesophageal Reflux Disease, Adult When you have gastroesophageal reflux disease (GERD), the foods you eat and your eating habits are very important. Choosing the right foods can help ease the discomfort of GERD. WHAT GENERAL GUIDELINES DO I NEED TO FOLLOW?  Choose fruits, vegetables, whole grains, low-fat dairy products, and low-fat meat, fish, and poultry.  Limit fats such as oils, salad dressings, butter, nuts, and avocado.  Keep a food diary to identify foods that cause symptoms.  Avoid foods that cause reflux. These may be different for different people.  Eat frequent small meals instead of three large meals each day.  Eat your meals slowly, in a relaxed setting.  Limit fried foods.  Cook foods using methods other than frying.  Avoid drinking alcohol.  Avoid drinking large amounts of liquids with your meals.  Avoid bending over or lying down until 2-3 hours after eating. WHAT FOODS ARE NOT RECOMMENDED? The following are some foods and drinks that may worsen your symptoms: Vegetables Tomatoes. Tomato juice. Tomato and spaghetti sauce. Chili peppers. Onion and garlic. Horseradish. Fruits Oranges, grapefruit, and lemon (fruit and juice). Meats High-fat meats, fish, and poultry. This  includes hot dogs, ribs, ham, sausage, salami, and bacon. Dairy Whole milk and chocolate milk. Sour cream. Cream. Butter. Ice cream. Cream cheese.   Beverages Coffee and tea, with or without caffeine. Carbonated beverages or energy drinks. Condiments Hot sauce. Barbecue sauce.  Sweets/Desserts Chocolate and cocoa. Donuts. Peppermint and spearmint. Fats and Oils High-fat foods, including [place] fries and potato chips. Other Vinegar. Strong spices, such as black pepper, white pepper, red pepper, cayenne, curry powder, cloves, ginger, and chili powder. The items listed above may not be a complete list of foods and beverages to avoid. Contact your dietitian for more information.   This information is not intended to replace advice given to you by your health care provider. Make sure you discuss any questions you have with your health care provider.   Document Released: 06/01/2005 Document Revised: 06/22/2014 Document Reviewed: 04/05/2013 Elsevier Interactive Patient Education [employer]

## 2015-08-03 ENCOUNTER — Encounter: Payer: Self-pay | Admitting: *Deleted

## 2015-08-06 ENCOUNTER — Encounter: Payer: Self-pay | Admitting: Physician Assistant

## 2015-08-12 ENCOUNTER — Other Ambulatory Visit: Payer: Self-pay | Admitting: Internal Medicine

## 2015-08-13 ENCOUNTER — Encounter: Payer: Self-pay | Admitting: Physician Assistant

## 2015-08-20 ENCOUNTER — Encounter: Payer: Self-pay | Admitting: Gastroenterology

## 2015-08-20 ENCOUNTER — Telehealth: Payer: Self-pay | Admitting: Internal Medicine

## 2015-08-20 NOTE — Telephone Encounter (Signed)
former Dr. Juanda ChanceBrodie patient. She says that her husband is a patient of Dr. Russella DarStark and is requesting to see him. She is due for recall colon but would like a consult for EGD as well. She states that her pcp has told her that she has stomach ulcers. Is it ok for patient to transfer GI Providers? Thanks.

## 2015-08-20 NOTE — Telephone Encounter (Signed)
Appointment scheduled with Dr. Russella DarStark

## 2015-08-20 NOTE — Telephone Encounter (Signed)
OK 

## 2015-08-21 ENCOUNTER — Encounter: Payer: Self-pay | Admitting: Internal Medicine

## 2015-09-15 ENCOUNTER — Other Ambulatory Visit: Payer: Self-pay | Admitting: Physician Assistant

## 2015-09-19 ENCOUNTER — Other Ambulatory Visit: Payer: Self-pay | Admitting: Physician Assistant

## 2015-10-07 ENCOUNTER — Other Ambulatory Visit: Payer: Self-pay | Admitting: Physician Assistant

## 2015-10-07 ENCOUNTER — Encounter: Payer: Self-pay | Admitting: Physician Assistant

## 2015-10-07 MED ORDER — FLUTICASONE PROPIONATE 50 MCG/ACT NA SUSP: 2.0000 | Freq: Every day | NASAL | Status: DC

## 2015-10-14 ENCOUNTER — Encounter: Payer: Self-pay | Admitting: Gastroenterology

## 2015-10-14 ENCOUNTER — Ambulatory Visit (INDEPENDENT_AMBULATORY_CARE_PROVIDER_SITE_OTHER): Payer: 59 | Admitting: Gastroenterology

## 2015-10-14 VITALS — BP 114/66 | HR 76 | Ht 60.5 in | Wt 108.2 lb

## 2015-10-14 DIAGNOSIS — K5900 Constipation, unspecified: Secondary | ICD-10-CM | POA: Diagnosis not present

## 2015-10-14 DIAGNOSIS — R221 Localized swelling, mass and lump, neck: Secondary | ICD-10-CM

## 2015-10-14 DIAGNOSIS — R1013 Epigastric pain: Secondary | ICD-10-CM

## 2015-10-14 DIAGNOSIS — K219 Gastro-esophageal reflux disease without esophagitis: Secondary | ICD-10-CM

## 2015-10-14 DIAGNOSIS — Z1211 Encounter for screening for malignant neoplasm of colon: Secondary | ICD-10-CM

## 2015-10-14 MED ORDER — ESOMEPRAZOLE MAGNESIUM 20 MG PO TBEC: 1.0000 | DELAYED_RELEASE_TABLET | Freq: Two times a day (BID) | ORAL | Status: DC

## 2015-10-14 NOTE — Progress Notes (Signed)
    History of Present Illness: This is a 65 year old female referred by Crista ElliotWilliam McKewon, MD for the evaluation of chronic constipation, GERD, dry cough, lump in throat, epigastric pain. She has a long history of GERD that is generally thin controlled on Nexium 20 mg daily. She had recurrent problems with sinus infections over the winter and was treated with a course of antibiotics and prednisone in February. After that she developed frequent epigastric pain lump in throat sensation occasional dry cough and worsening reflux symptoms. Symptoms are often exacerbated by meals. Nexium was increased to 40 mg daily with some improvement in symptoms. Carafate was added but she does not feel this has helped her symptoms. She has chronic constipation and has not changed in many years. She previously underwent colonoscopy by Dr. Juanda ChanceBrodie in October 2006 which was normal. She was treated for constipation at that time with Zelnorm and MiraLAX. She also complains of a dry mouth and loss of taste. Denies weight loss, diarrhea, change in stool caliber, melena, hematochezia, nausea, vomiting, dysphagia, chest pain.  Review of Systems: Pertinent positive and negative review of systems were noted in the above HPI section. All other review of systems were otherwise negative.  Current Medications, Allergies, Past Medical History, Past Surgical History, Family History and Social History were [facility] in [facility] electronic [facility] record.  Physical Exam: General: Well developed, well nourished, no acute distress Head: Normocephalic and atraumatic Eyes:  sclerae anicteric, EOMI Ears: Normal auditory acuity Mouth: No deformity or lesions Neck: Supple, no masses or thyromegaly Lungs: Clear throughout to auscultation Heart: Regular rate and rhythm; no murmurs, rubs or bruits Abdomen: Soft, non tender and non distended. No masses, hepatosplenomegaly or hernias noted. Normal Bowel sounds Musculoskeletal: Symmetrical  with no gross deformities  Skin: No lesions on visible extremities Pulses:  Normal pulses noted Extremities: No clubbing, cyanosis, edema or deformities noted Neurological: Alert oriented x 4, grossly nonfocal Cervical Nodes:  No significant cervical adenopathy Inguinal Nodes: No significant inguinal adenopathy Psychological:  Alert and cooperative. Normal mood and affect  Assessment and Recommendations:  1. Suspected GERD flare with LPR symptoms leading to epigastric pain, lump in throat sensation and dry cough. Advised to consider discontinuing Dexedrine and she is advised to discuss this with the prescribing physician. Discontinue Carafate. Increase Nexium to 40 mg twice daily and intensify antireflux measures. Schedule EGD. The risks (including bleeding, perforation, infection, missed lesions, medication reactions and possible hospitalization or surgery if complications occur), benefits, and alternatives to endoscopy with possible biopsy and possible dilation were discussed with the patient and they consent to proceed.   2. Chronic constipation. Continue Amitza 24 g twice a day, Senokot and Colace.  3. CRC screening, average risk. She is due for a 10 year interval screening colonoscopy and would like to defer this a few months until her upper GI symptoms are GERD symptoms are under better control.  cc: Lucky CowboyWilliam McKeown, MD

## 2015-10-14 NOTE — Patient Instructions (Signed)
Patient advised to avoid spicy, acidic, citrus, chocolate, mints, fruit and fruit juices.  Limit the intake of caffeine, alcohol and Soda.  Don't exercise too soon after eating.  Don't lie down within 3-4 hours of eating.  Elevate the head of your bed.  Increase your Nexium to 40 mg twice daily. A new prescription has been sent to your pharmacy.   You have been scheduled for an endoscopy. Please follow written instructions given to you at your visit today. If you use inhalers (even only as needed), please bring them with you on the day of your procedure. Your physician has requested that you go to www.startemmi.com and enter the access code given to you at your visit today. This web site gives a general overview about your procedure. However, you should still follow specific instructions given to you by our office regarding your preparation for the procedure.  Discontinue your Carafate.   Talk to your prescribing doctor about discontinuing your Dexedrine.   You will be due for a recall colonoscopy in 02/2016. We will send you a reminder in the mail when it gets closer to that time.  Thank you for choosing me and [facility].  Venita LickMalcolm T. Pleas KochStark, Jr., MD., Clementeen GrahamFACG

## 2015-10-15 ENCOUNTER — Other Ambulatory Visit: Payer: Self-pay | Admitting: Internal Medicine

## 2015-10-18 ENCOUNTER — Encounter: Payer: Self-pay | Admitting: Gastroenterology

## 2015-10-18 ENCOUNTER — Ambulatory Visit (AMBULATORY_SURGERY_CENTER): Payer: 59 | Admitting: Gastroenterology

## 2015-10-18 VITALS — BP 104/63 | HR 67 | Temp 98.0°F | Resp 10 | Ht 60.0 in | Wt 108.0 lb

## 2015-10-18 DIAGNOSIS — R1013 Epigastric pain: Secondary | ICD-10-CM | POA: Diagnosis not present

## 2015-10-18 DIAGNOSIS — R221 Localized swelling, mass and lump, neck: Secondary | ICD-10-CM | POA: Diagnosis present

## 2015-10-18 DIAGNOSIS — K219 Gastro-esophageal reflux disease without esophagitis: Secondary | ICD-10-CM

## 2015-10-18 MED ORDER — SODIUM CHLORIDE 0.9 % IV SOLN: 500.0000 mL | INTRAVENOUS | Status: DC

## 2015-10-18 NOTE — Progress Notes (Signed)
Report to PACU, RN, vss, BBS= Clear.  

## 2015-10-18 NOTE — Patient Instructions (Signed)
YOU HAD AN ENDOSCOPIC PROCEDURE TODAY AT THE [facility]:   Refer to the procedure report that was given to you for any specific questions about what was found during the examination.  If the procedure report does not answer your questions, please call your gastroenterologist to clarify.  If you requested that your care partner not be given the details of your procedure findings, then the procedure report has been included in a sealed envelope for you to review at your convenience later.  YOU SHOULD EXPECT: Some feelings of bloating in the abdomen. Passage of more gas than usual.  Walking can help get rid of the air that was put into your GI tract during the procedure and reduce the bloating. If you had a lower endoscopy (such as a colonoscopy or flexible sigmoidoscopy) you may notice spotting of blood in your stool or on the toilet paper. If you underwent a bowel prep for your procedure, you may not have a normal bowel movement for a few days.  Please Note:  You might notice some irritation and congestion in your nose or some drainage.  This is from the oxygen used during your procedure.  There is no need for concern and it should clear up in a day or so.  SYMPTOMS TO REPORT IMMEDIATELY:    Following upper endoscopy (EGD)  Vomiting of blood or coffee ground material  New chest pain or pain under the shoulder blades  Painful or persistently difficult swallowing  New shortness of breath  Fever of 100F or higher  Black, tarry-looking stools  For urgent or emergent issues, a gastroenterologist can be reached at any hour by calling [phone].   DIET: Your first meal following the procedure should be a small meal and then it is ok to progress to your normal diet. Heavy or fried foods are harder to digest and may make you feel nauseous or bloated.  Likewise, meals heavy in dairy and vegetables can increase bloating.  Drink plenty of fluids but you should avoid alcoholic beverages  for 24 hours.  ACTIVITY:  You should plan to take it easy for the rest of today and you should NOT DRIVE or use heavy machinery until tomorrow (because of the sedation medicines used during the test).    FOLLOW UP: Our staff will call the number listed on your records the next business day following your procedure to check on you and address any questions or concerns that you may have regarding the information given to you following your procedure. If we do not reach you, we will leave a message.  However, if you are feeling well and you are not experiencing any problems, there is no need to return our call.  We will assume that you have returned to your regular daily activities without incident.  If any biopsies were taken you will be contacted by phone or by letter within the next 1-3 weeks.  Please call us at [phone] if you have not heard about the biopsies in 3 weeks.    SIGNATURES/CONFIDENTIALITY: You and/or your care partner have signed paperwork which will be entered into your electronic medical record.  These signatures attest to the fact that that the information above on your After Visit Summary has been reviewed and is understood.  Full responsibility of the confidentiality of this discharge information lies with you and/or your care-partner.  Anti reflux measures given.  Continue Nexium 40 mg.  Twice daily.  See Dr. Russella DarStark in 3 months.

## 2015-10-18 NOTE — Op Note (Signed)
[facility] Patient Name: Alexis Weaver Procedure Date: 10/18/2015 10:58 AM MRN: [id number] Endoscopist: Meryl DareMalcolm T Izeyah Deike , MD Age: 89 Date of Birth: Apr 27, 1951 Gender: Female Procedure:                Upper GI endoscopy Indications:              Epigastric abdominal pain, Esophageal reflux                            symptoms that persist despite appropriate therapy Medicines:                Monitored Anesthesia Care Procedure:                Pre-Anesthesia Assessment:                           - Prior to the procedure, a History and Physical                            was performed, and patient medications and                            allergies were reviewed. The patient's tolerance of                            previous anesthesia was also reviewed. The risks                            and benefits of the procedure and the sedation                            options and risks were discussed with the patient.                            All questions were answered, and informed consent                            was obtained. Prior Anticoagulants: The patient has                            taken no previous anticoagulant or antiplatelet                            agents. ASA Grade Assessment: II - A patient with                            mild systemic disease. After reviewing the risks                            and benefits, the patient was deemed in                            satisfactory condition to undergo the procedure.  After obtaining informed consent, the endoscope was                            passed under direct vision. Throughout the                            procedure, the patient's blood pressure, pulse, and                            oxygen saturations were monitored continuously. The                            Model GIF-HQ190 [id number]) scope was introduced                            through the mouth, and advanced to the second  part                            of duodenum. The upper GI endoscopy was                            accomplished without difficulty. The patient                            tolerated the procedure well. Scope In: Scope Out: Findings:                 The esophagus was normal.                           The stomach was normal.                           The examined duodenum was normal.                           The cardia and gastric fundus were normal on                            retroflexion. Complications:            No immediate complications. Estimated Blood Loss:     Estimated blood loss: none. Impression:               - Normal EGD. Recommendation:           - Patient has a contact number available for                            emergencies. The signs and symptoms of potential                            delayed complications were discussed with the                            patient. Return to normal activities tomorrow.  Written discharge instructions were provided to the                            patient.                           - Resume previous diet with antireflux measures.                           - Continue present medications including Nexium 40                            mg po bid.                           - Return to my office in 3 months. Meryl Dare, MD 10/18/2015 11:17:44 AM This report has been signed electronically.

## 2015-10-21 ENCOUNTER — Telehealth: Payer: Self-pay

## 2015-10-21 NOTE — Telephone Encounter (Signed)
  Follow up Call-  Call back number 10/18/2015  Post procedure Call Back phone  # [phone]  Permission to leave phone message Yes    Patient was called for follow up after procedure on 10/18/2015. No answer at the number given for follow up phone call. A message was left on the answering machine.

## 2015-10-28 ENCOUNTER — Other Ambulatory Visit: Payer: Self-pay | Admitting: Internal Medicine

## 2015-11-05 ENCOUNTER — Encounter: Payer: Self-pay | Admitting: Internal Medicine

## 2015-11-08 ENCOUNTER — Other Ambulatory Visit: Payer: Self-pay | Admitting: Internal Medicine

## 2015-12-05 ENCOUNTER — Encounter: Payer: Self-pay | Admitting: Physician Assistant

## 2015-12-07 ENCOUNTER — Other Ambulatory Visit: Payer: Self-pay | Admitting: Physician Assistant

## 2015-12-24 ENCOUNTER — Other Ambulatory Visit: Payer: Self-pay | Admitting: Internal Medicine

## 2015-12-24 ENCOUNTER — Other Ambulatory Visit: Payer: Self-pay | Admitting: Physician Assistant

## 2016-01-07 ENCOUNTER — Ambulatory Visit (INDEPENDENT_AMBULATORY_CARE_PROVIDER_SITE_OTHER): Payer: 59 | Admitting: Internal Medicine

## 2016-01-07 ENCOUNTER — Encounter: Payer: Self-pay | Admitting: Internal Medicine

## 2016-01-07 ENCOUNTER — Ambulatory Visit (HOSPITAL_COMMUNITY)
Admission: RE | Admit: 2016-01-07 | Discharge: 2016-01-07 | Disposition: A | Discharge status: Home / Self Care | Payer: 59 | Source: Ambulatory Visit | Attending: Internal Medicine | Admitting: Internal Medicine

## 2016-01-07 VITALS — BP 104/66 | HR 94 | Temp 98.0°F | Resp 18 | Wt 105.0 lb

## 2016-01-07 DIAGNOSIS — M25562 Pain in left knee: Secondary | ICD-10-CM

## 2016-01-07 MED ORDER — TRAMADOL HCL 50 MG PO TABS: 50.0000 mg | ORAL_TABLET | Freq: Four times a day (QID) | ORAL | 0 refills | Status: DC | PRN

## 2016-01-07 NOTE — Progress Notes (Addendum)
   Subjective:    Patient ID: Alexis Weaver, female    DOB: 1950/12/14, 65 y.o.   MRN: [id number]  HPI   Patient reports that she dropped a hammer on her left knee approximately 6 weeks ago.  She reports that she iced and elevated her knee.  She reports that it felt like a bad bruise initially.  She reports that she does well with straight forward movements but turning and translational activities really bother her.  She reports that the medial portion of her knee is still really bothering her. She is wearing a compression sleeve which is a little bit more comfortable.  She does get some mild relief with heat.  She reports that she has  Not taken a lot of OTC meds.     Review of Systems  Constitutional: Negative for chills, fatigue and fever.  Musculoskeletal: Positive for arthralgias, gait problem and joint swelling.       Objective:   Physical Exam  Constitutional: She appears well-developed and well-nourished. No distress.  HENT:  Head: Normocephalic.  Mouth/Throat: Oropharynx is clear and moist. No oropharyngeal exudate.  Eyes: Conjunctivae are normal. No scleral icterus.  Neck: Normal range of motion. Neck supple. No JVD present. No thyromegaly present.  Cardiovascular: Normal rate, regular rhythm, normal heart sounds and intact distal pulses.  Exam reveals no gallop and no friction rub.   No murmur heard. Pulmonary/Chest: Effort normal and breath sounds normal. No respiratory distress. She has no wheezes. She has no rales. She exhibits no tenderness.  Musculoskeletal: Normal range of motion.       Left knee: She exhibits abnormal patellar mobility. She exhibits normal range of motion, no swelling, no effusion, no ecchymosis, no deformity, no laceration, no erythema, normal alignment, no LCL laxity, no bony tenderness, normal meniscus and no MCL laxity. No medial joint line, no lateral joint line, no MCL, no LCL and no patellar tendon tenderness noted.       Legs: Lymphadenopathy:     She has no cervical adenopathy.  Skin: Skin is warm and dry. She is not diaphoretic.  Psychiatric: She has a normal mood and affect. Her behavior is normal. Judgment and thought content normal.  Nursing note and vitals reviewed.   Vitals:   01/07/16 1004  BP: 104/66  Pulse: 94  Resp: 18  Temp: 98 F (36.7 C)         Assessment & Plan:    1. Left knee pain -take pain medication with claritin -referral to ortho - traMADol (ULTRAM) 50 MG tablet; Take 1 tablet (50 mg total) by mouth every 6 (six) hours as needed.  Dispense: 60 tablet; Refill: 0 - DG Knee Complete 4 Views Left; Future

## 2016-01-17 ENCOUNTER — Encounter: Payer: Self-pay | Admitting: Physician Assistant

## 2016-01-30 ENCOUNTER — Encounter: Payer: Self-pay | Admitting: *Deleted

## 2016-02-05 ENCOUNTER — Other Ambulatory Visit: Payer: Self-pay | Admitting: Internal Medicine

## 2016-02-05 MED ORDER — PREDNISONE 20 MG PO TABS: ORAL_TABLET | ORAL | 1 refills | Status: DC

## 2016-02-17 ENCOUNTER — Encounter: Payer: Self-pay | Admitting: *Deleted

## 2016-02-19 ENCOUNTER — Ambulatory Visit (INDEPENDENT_AMBULATORY_CARE_PROVIDER_SITE_OTHER): Payer: 59 | Admitting: Physician Assistant

## 2016-02-19 ENCOUNTER — Encounter: Payer: Self-pay | Admitting: Physician Assistant

## 2016-02-19 VITALS — BP 116/76 | HR 75 | Temp 97.5°F | Resp 14 | Ht 60.5 in | Wt 104.6 lb

## 2016-02-19 DIAGNOSIS — F419 Anxiety disorder, unspecified: Secondary | ICD-10-CM

## 2016-02-19 DIAGNOSIS — R002 Palpitations: Secondary | ICD-10-CM

## 2016-02-19 DIAGNOSIS — K589 Irritable bowel syndrome without diarrhea: Secondary | ICD-10-CM

## 2016-02-19 DIAGNOSIS — R6889 Other general symptoms and signs: Secondary | ICD-10-CM | POA: Diagnosis not present

## 2016-02-19 DIAGNOSIS — J45909 Unspecified asthma, uncomplicated: Secondary | ICD-10-CM | POA: Diagnosis not present

## 2016-02-19 DIAGNOSIS — Z0001 Encounter for general adult medical examination with abnormal findings: Secondary | ICD-10-CM

## 2016-02-19 DIAGNOSIS — D649 Anemia, unspecified: Secondary | ICD-10-CM

## 2016-02-19 DIAGNOSIS — M8580 Other specified disorders of bone density and structure, unspecified site: Secondary | ICD-10-CM

## 2016-02-19 DIAGNOSIS — R7303 Prediabetes: Secondary | ICD-10-CM | POA: Diagnosis not present

## 2016-02-19 DIAGNOSIS — F329 Major depressive disorder, single episode, unspecified: Secondary | ICD-10-CM

## 2016-02-19 DIAGNOSIS — J320 Chronic maxillary sinusitis: Secondary | ICD-10-CM

## 2016-02-19 DIAGNOSIS — Z79899 Other long term (current) drug therapy: Secondary | ICD-10-CM

## 2016-02-19 DIAGNOSIS — E785 Hyperlipidemia, unspecified: Secondary | ICD-10-CM

## 2016-02-19 DIAGNOSIS — E559 Vitamin D deficiency, unspecified: Secondary | ICD-10-CM

## 2016-02-19 DIAGNOSIS — F32A Depression, unspecified: Secondary | ICD-10-CM

## 2016-02-19 LAB — CBC WITH DIFFERENTIAL/PLATELET
BASOS ABS: 51 {cells}/uL (ref 0–200)
Basophils Relative: 1 %
EOS ABS: 51 {cells}/uL (ref 15–500)
EOS PCT: 1 %
HEMATOCRIT: 40 % (ref 35.0–45.0)
HEMOGLOBIN: 13 g/dL (ref 11.7–15.5)
LYMPHS ABS: 1683 {cells}/uL (ref 850–3900)
Lymphocytes Relative: 33 %
MCH: 29.7 pg (ref 27.0–33.0)
MCHC: 32.5 g/dL (ref 32.0–36.0)
MCV: 91.3 fL (ref 80.0–100.0)
MPV: 10.5 fL (ref 7.5–12.5)
Monocytes Absolute: 459 cells/uL (ref 200–950)
Monocytes Relative: 9 %
NEUTROS PCT: 56 %
Neutro Abs: 2856 cells/uL (ref 1500–7800)
Platelets: 298 10*3/uL (ref 140–400)
RBC: 4.38 MIL/uL (ref 3.80–5.10)
RDW: 15 % (ref 11.0–15.0)
WBC: 5.1 10*3/uL (ref 3.8–10.8)

## 2016-02-19 NOTE — Progress Notes (Signed)
Complete Physical  Assessment and Plan: Prediabetes Discussed general issues about diabetes pathophysiology and management., Educational material distributed., Suggested low cholesterol diet., Encouraged aerobic exercise., Discussed foot care., Reminded to get yearly retinal exam. - Lipid panel - Hemoglobin A1c - Insulin, fasting - Urinalysis, Routine w reflex microscopic ([facility]) - Microalbumin / creatinine urine ratio - EKG 12-Lead  Osteopenia Get DEXA  Chronic maxillary sinusitis monitor   Palpitations Better will decreased stress   Vitamin D deficiency - Vit D  25 hydroxy (rtn osteoporosis monitoring)  Encounter for long-term (current) use of medications - CBC with Differential/Platelet - BASIC METABOLIC PANEL WITH GFR - Hepatic function panel - Magnesium  Depression Follow up Dr. Lyanne CoKeur, no SI/HI - TSH   Anxiety Follow up Dr. Lyanne CoKeur   IBS (irritable bowel syndrome)-constipation type Continue amitza  Asthma, chronic, unspecified asthma severity, uncomplicated No signs of infection at this time, will check CBC, get back on advair and  - predniSONE (DELTASONE) 20 MG tablet; 3 tabs po day one, then 2 tabs daily x 4 days  Dispense: 11 tablet; Refill: 0   Anemia, unspecified anemia type - Iron and TIBC - Ferritin - Vitamin B12   Discussed med's effects and SE's. Screening labs and tests as requested with regular follow-up as recommended.  HPI 65 y.o. female  presents for a complete physical.  Her blood pressure has been controlled at home, today their BP is BP: 116/76 She does workout. She denies chest pain, shortness of breath, dizziness.  She is not on cholesterol medication and denies myalgias. Her cholesterol is at goal. The cholesterol last visit was:  Lab Results  Component Value Date   CHOL 168 01/16/2015   HDL 84 01/16/2015   LDLCALC 75 01/16/2015   TRIG 44 01/16/2015   CHOLHDL 2.0 01/16/2015   Last Z6XA1C in the office was: Lab Results   Component Value Date   HGBA1C 6.0 (H) 07/22/2015   Patient is on Vitamin D supplement Lab Results  Component Value Date   VD25OH 48 07/22/2015   She has had a negative ANA, antiDNA, RF.    She is on estrogen therapy and on bASA. She sees a therapist every other week and sees Dr. Evelene CroonKaur twice a year, has been on it for 3 weeks. Very upset about her son relapsing and still taking care of her brother. She has had increased anxiety with son, taking care of brother. She uses 0.25 xanax nightly but now she is taking 0.75. She was tried on trazodone on and has been taking 1/3, trying to get off xanax. She is on dexadrin once a day now from Dr. Evelene CroonKaur and she is on the savella.  Seeing Dr. Ezzard StandingNewman  Has seen Dr. Zeeland CallasSharma for allergies and asthma, has very bad allergies at this time, using advair/flonase,     Current Medications:  Current Outpatient Prescriptions on File Prior to Visit  Medication Sig Dispense Refill  . ADVAIR DISKUS 250-50 MCG/DOSE AEPB inhale 1 puff INTO THE LUNGS twice a day if needed for shortness of breath 60 each 11  . almotriptan (AXERT) 12.5 MG tablet take 1 by mouth if needed for MIGRAINE;  MAY REPEAT IF NEEDED  (MAXIMUM OF 2 TABLETS IN 24 HOURS) 12 tablet 2  . ALPRAZolam (XANAX) 0.25 MG tablet take 1 tablet by mouth three times a day if needed for anxiety 90 tablet 5  . AMITIZA 24 MCG capsule take 1 capsule by mouth twice a day 180 capsule 1  .  bimatoprost (LATISSE) 0.03 % ophthalmic solution Place one drop on applicator and apply evenly along the skin of the upper eyelid at base of eyelashes once daily at bedtime; repeat procedure for second eye (use a clean applicator). 3 mL 11  . Biotin 1000 MCG tablet Take 1,000 mcg by mouth daily. Reported on 10/18/2015    . cycloSPORINE (RESTASIS) 0.05 % ophthalmic emulsion Place 1 drop into both eyes 2 (two) times daily.      Marland Kitchen dextroamphetamine (DEXEDRINE SPANSULE) 10 MG 24 hr capsule Take 10 mg by mouth daily.   0  . docusate sodium  (COLACE) 100 MG capsule Take 100 mg by mouth as needed for mild constipation.    . Esomeprazole Magnesium (NEXIUM 24HR) 20 MG TBEC Take 1 tablet by mouth 2 (two) times daily. 60 tablet 11  . loratadine (CLARITIN) 10 MG tablet Take 10 mg by mouth daily.    . magnesium gluconate (MAGONATE) 500 MG tablet Take 500 mg by mouth daily.    . montelukast (SINGULAIR) 10 MG tablet take 1 tablet by mouth once daily 30 tablet 3  . ondansetron (ZOFRAN) 8 MG tablet Take 1 tablet (8 mg total) by mouth every 8 (eight) hours as needed for nausea or vomiting. 100 tablet 3  . Probiotic Product (PROBIOTIC DAILY PO) Take 1 tablet by mouth daily.    . RELPAX 40 MG tablet TAKE 1 TABLET BY MOUTH IMMEDIATELY IF NEEDED FOR HEADACHE AND MAY REPEAT ONE TIME AFTER 2 HOURS (MAXIUM OF 2 TABLETS IN 24 HOURS) 6 tablet 1  . SAVELLA 100 MG TABS tablet take 1 to 2 tablets by mouth once daily as directed for MOOD and depression 60 tablet 97  . Sennosides (SENOKOT PO) Take 1 tablet by mouth daily.    Marland Kitchen terbinafine (LAMISIL) 250 MG tablet Take 250 mg by mouth daily.    . traZODone (DESYREL) 50 MG tablet Take 50 mg by mouth at bedtime.  1  . triamterene-hydrochlorothiazide (MAXZIDE) 75-50 MG tablet take 1 tablet by mouth once daily for high blood pressure and fluid retention 31 tablet PRN  . VITAMIN D, ERGOCALCIFEROL, PO Take by mouth. 5,000 IU daily    . VIVELLE-DOT 0.05 MG/24HR patch apply 1 patch two times a week 8 patch PRN   No current facility-administered medications on file prior to visit.    Health Maintenance:   Immunization History  Administered Date(s) Administered  . DT 11/06/2004  . Influenza-Unspecified 02/14/2012  . Tdap 02/26/2011   Tetanus: 2012 Pneumovax: N/A Prevnar 13: due at age 89 Flu vaccine: 2013 Zostavax: N/A Pap: 2012  Follow up OB/GYN MGM: 12/2015 DEXA: 09/2010 Colonoscopy: 2006 due 2016 getting with Dr. Russella Dar EGD: 10/2015 MRI brain 2010 CXR 2015  Medical History:  Past Medical History:   Diagnosis Date  . Anemia 2016  . Anxiety   . Asthma   . Chronic headaches   . Chronic sinusitis   . Depression   . Fibromyalgia   . GERD (gastroesophageal reflux disease)   . IBS (irritable bowel syndrome)   . Meckel diverticulum 1997  . Meckel's diverticulum   . Osteopenia    Allergies Allergies  Allergen Reactions  . Hydrocodone-Acetaminophen Shortness Of Breath  . Codeine Hives  . Morphine And Related Hives  . Nitrofurantoin Other (See Comments)    Makes hands and feet burn    SURGICAL HISTORY She  has a past surgical history that includes Abdominal hysterectomy; Cesarean section (1979, 1983); Breast enhancement surgery; Laparoscopic abdominal exploration; Hemorrhoid  banding; and Appendectomy (1997). FAMILY HISTORY Her family history includes Asthma in her brother; COPD in her brother; Diabetes in her brother and father; Diverticulitis in her maternal grandmother; Hyperlipidemia in her brother; Hypertension in her father and mother; Rheum arthritis in her sister. SOCIAL HISTORY She  reports that she has never smoked. She has never used smokeless tobacco. She reports that she does not drink alcohol or use drugs.  Review of Systems  Constitutional: Positive for malaise/fatigue. Negative for chills, diaphoresis, fever and weight loss.  HENT: Negative for congestion, ear discharge, ear pain, hearing loss, nosebleeds, sore throat and tinnitus.   Eyes: Negative.   Respiratory: Negative for cough, hemoptysis, sputum production, shortness of breath, wheezing and stridor.   Cardiovascular: Negative.   Gastrointestinal: Positive for constipation (on amitiza) and heartburn (better on nexium). Negative for abdominal pain, blood in stool, diarrhea, melena, nausea and vomiting.  Genitourinary: Positive for frequency. Negative for dysuria, flank pain, hematuria and urgency.  Musculoskeletal: Positive for myalgias. Negative for back pain, falls, joint pain and neck pain.  Skin: Negative.    Neurological: Negative.  Negative for weakness and headaches.  Psychiatric/Behavioral: Positive for depression. Negative for hallucinations, memory loss, substance abuse and suicidal ideas. The patient is not nervous/anxious.     Physical Exam: Estimated body mass index is 20.09 kg/m as calculated from the following:   Height as of this encounter: 5' 0.5" (1.537 m).   Weight as of this encounter: 104 lb 9.6 oz (47.4 kg). BP 116/76   Pulse 75   Temp 97.5 F (36.4 C)   Resp 14   Ht 5' 0.5" (1.537 m)   Wt 104 lb 9.6 oz (47.4 kg)   SpO2 98%   BMI 20.09 kg/m  General Appearance: Well nourished, in no apparent distress. Eyes: PERRLA, EOMs, conjunctiva no swelling or erythema, normal fundi and vessels. Sinuses: No Frontal/maxillary tenderness ENT/Mouth: Ext aud canals clear, normal light reflex with TMs without erythema, bulging.  Good dentition. No erythema, swelling, or exudate on post pharynx. Tonsils not swollen or erythematous. Hearing normal.  Neck: Supple, thyroid normal. No bruits Respiratory: Respiratory effort normal, coarse breath sounds, BS equal bilaterally without rales, rhonchi, wheezing or stridor. Cardio: RRR without murmurs, rubs or gallops. Brisk peripheral pulses without edema.  Chest: symmetric, with normal excursions and percussion. Breasts: defer  Abdomen: Soft, +BS. Non tender, no guarding, rebound, hernias, masses, or organomegaly. .  Lymphatics: Non tender without lymphadenopathy.  Genitourinary: defer Musculoskeletal: Full ROM all peripheral extremities,5/5 strength, and normal gait. Skin: left wrist with non healing ulcer. On bilateral legs, erythematous papules. Warm, dry without lesions, ecchymosis.  Neuro: Cranial nerves intact, reflexes equal bilaterally. Normal muscle tone, no cerebellar symptoms. Sensation intact.  Psych: Awake and oriented X 3, normal affect, Insight and Judgment appropriate.   EKG: WNL no changes.   Quentin Mulling 10:40 AM

## 2016-02-20 LAB — BASIC METABOLIC PANEL WITH GFR
BUN: 19 mg/dL (ref 7–25)
CALCIUM: 9.2 mg/dL (ref 8.6–10.4)
CO2: 31 mmol/L (ref 20–31)
CREATININE: 0.85 mg/dL (ref 0.50–0.99)
Chloride: 98 mmol/L (ref 98–110)
GFR, Est African American: 84 mL/min (ref >=60)
GFR, Est Non African American: 73 mL/min (ref >=60)
GLUCOSE: 62 mg/dL — AB (ref 65–99)
Potassium: 3.6 mmol/L (ref 3.5–5.3)
SODIUM: 136 mmol/L (ref 135–146)

## 2016-02-20 LAB — URINALYSIS, ROUTINE W REFLEX MICROSCOPIC
BILIRUBIN URINE: NEGATIVE
GLUCOSE, UA: NEGATIVE
HGB URINE DIPSTICK: NEGATIVE
Ketones, ur: NEGATIVE
LEUKOCYTES UA: NEGATIVE
Nitrite: NEGATIVE
PH: 7.5 (ref 5.0–8.0)
Protein, ur: NEGATIVE
Specific Gravity, Urine: 1.015 (ref 1.001–1.035)

## 2016-02-20 LAB — LIPID PANEL
CHOLESTEROL: 195 mg/dL (ref 125–200)
HDL: 129 mg/dL (ref >=46)
LDL Cholesterol: 59 mg/dL (ref <130)
Total CHOL/HDL Ratio: 1.5 Ratio (ref <=5.0)
Triglycerides: 37 mg/dL (ref <150)
VLDL: 7 mg/dL (ref <30)

## 2016-02-20 LAB — IRON AND TIBC
%SAT: 15 % (ref 11–50)
Iron: 56 ug/dL (ref 45–160)
TIBC: 372 ug/dL (ref 250–450)
UIBC: 316 ug/dL (ref 125–400)

## 2016-02-20 LAB — FERRITIN: FERRITIN: 10 ng/mL — AB (ref 20–288)

## 2016-02-20 LAB — TSH: TSH: 1.78 m[IU]/L

## 2016-02-20 LAB — MAGNESIUM: MAGNESIUM: 1.8 mg/dL (ref 1.5–2.5)

## 2016-02-20 LAB — VITAMIN B12: Vitamin B-12: 924 pg/mL (ref 200–1100)

## 2016-02-20 LAB — HEPATIC FUNCTION PANEL
ALBUMIN: 4.2 g/dL (ref 3.6–5.1)
ALT: 15 U/L (ref 6–29)
AST: 22 U/L (ref 10–35)
Alkaline Phosphatase: 52 U/L (ref 33–130)
Bilirubin, Direct: 0.1 mg/dL (ref <=0.2)
Indirect Bilirubin: 0.3 mg/dL (ref 0.2–1.2)
TOTAL PROTEIN: 6.7 g/dL (ref 6.1–8.1)
Total Bilirubin: 0.4 mg/dL (ref 0.2–1.2)

## 2016-02-20 LAB — MICROALBUMIN / CREATININE URINE RATIO
Creatinine, Urine: 48 mg/dL (ref 20–320)
Microalb, Ur: <0.2 mg/dL

## 2016-02-20 LAB — VITAMIN D 25 HYDROXY (VIT D DEFICIENCY, FRACTURES): VIT D 25 HYDROXY: 77 ng/mL (ref 30–100)

## 2016-02-20 LAB — HEMOGLOBIN A1C
HEMOGLOBIN A1C: 5.5 % (ref <5.7)
MEAN PLASMA GLUCOSE: 111 mg/dL

## 2016-03-06 ENCOUNTER — Encounter: Payer: Self-pay | Admitting: Gastroenterology

## 2016-03-06 ENCOUNTER — Ambulatory Visit (INDEPENDENT_AMBULATORY_CARE_PROVIDER_SITE_OTHER): Payer: 59 | Admitting: Gastroenterology

## 2016-03-06 VITALS — BP 114/60 | HR 84 | Ht 60.5 in | Wt 104.6 lb

## 2016-03-06 DIAGNOSIS — Z1212 Encounter for screening for malignant neoplasm of rectum: Secondary | ICD-10-CM

## 2016-03-06 DIAGNOSIS — Z1211 Encounter for screening for malignant neoplasm of colon: Secondary | ICD-10-CM

## 2016-03-06 DIAGNOSIS — K5900 Constipation, unspecified: Secondary | ICD-10-CM

## 2016-03-06 DIAGNOSIS — K219 Gastro-esophageal reflux disease without esophagitis: Secondary | ICD-10-CM

## 2016-03-06 MED ORDER — NA SULFATE-K SULFATE-MG SULF 17.5-3.13-1.6 GM/177ML PO SOLN: ORAL | 0 refills | Status: DC

## 2016-03-06 NOTE — Progress Notes (Signed)
    History of Present Illness: This is a 65 year old female returning for follow-up of globus sensation, burning in throat and cough. She relates ongoing problems with all symptoms. They have not improved at all on Nexium twice daily along with ranitidine twice daily. EGD performed in May 2017 was entirely normal. Chronic constipation persists. Occasionally she needs to use MiraLAX.  Current Medications, Allergies, Past Medical History, Past Surgical History, Family History and Social History were reviewed in [facility] electronic medical record.  Physical Exam: General: Well developed, well nourished, no acute distress Head: Normocephalic and atraumatic Eyes:  sclerae anicteric, EOMI Ears: Normal auditory acuity Mouth: No deformity or lesions Lungs: Clear throughout to auscultation Heart: Regular rate and rhythm; no murmurs, rubs or bruits Abdomen: Soft, non tender and non distended. No masses, hepatosplenomegaly or hernias noted. Normal Bowel sounds Rectal: deferred to colonoscopy Musculoskeletal: Symmetrical with no gross deformities  Pulses:  Normal pulses noted Extremities: No clubbing, cyanosis, edema or deformities noted Neurological: Alert oriented x 4, grossly nonfocal Psychological:  Alert and cooperative. Normal mood and affect  Assessment and Recommendations:  1. Although she has GERD her cough, throat burning and globus sensation have not improved at all with Nexium bid and ranitidine bid indicating the symptoms are not  related to GERD. Discontinue ranitidine. Continue Nexium 20 mg twice daily for management of heartburn symptoms. Return to PCP and allergist to evaluate for other causes of throat burning, cough and globus sensation.   2. Chronic constipation. Continue Amitza 24 g twice daily. MiraLAX once or twice daily as needed. Attempt to increase daily water intake.  3. CRC screening, average risk. Schedule colonoscopy. The risks (including bleeding, perforation,  infection, missed lesions, medication reactions and possible hospitalization or surgery if complications occur), benefits, and alternatives to colonoscopy with possible biopsy and possible polypectomy were discussed with the patient and they consent to proceed.

## 2016-03-06 NOTE — Patient Instructions (Addendum)
  You have been scheduled for a colonoscopy. Please follow written instructions given to you at your visit today.  Please pick up your prep supplies at the pharmacy within the next 1-3 days. If you use inhalers (even only as needed), please bring them with you on the day of your procedure. Your physician has requested that you go to www.startemmi.com and enter the access code given to you at your visit today. This web site gives a general overview about your procedure. However, you should still follow specific instructions given to you by our office regarding your preparation for the procedure.  If you are age 65 or older, your body mass index should be between 23-30. Your Body mass index is 20.09 kg/m. If this is out of the aforementioned range listed, please consider follow up with your Primary Care Provider.  If you are age 65 or younger, your body mass index should be between 19-25. Your Body mass index is 20.09 kg/m. If this is out of the aformentioned range listed, please consider follow up with your Primary Care Provider.

## 2016-04-13 ENCOUNTER — Other Ambulatory Visit: Payer: Self-pay | Admitting: Emergency Medicine

## 2016-04-14 ENCOUNTER — Ambulatory Visit: Payer: Self-pay | Admitting: Internal Medicine

## 2016-04-15 ENCOUNTER — Encounter: Payer: Self-pay | Admitting: Gastroenterology

## 2016-04-23 ENCOUNTER — Encounter: Payer: 59 | Admitting: Gastroenterology

## 2016-04-29 ENCOUNTER — Encounter: Payer: Self-pay | Admitting: Gastroenterology

## 2016-04-29 ENCOUNTER — Ambulatory Visit (AMBULATORY_SURGERY_CENTER): Payer: 59 | Admitting: Gastroenterology

## 2016-04-29 VITALS — BP 132/79 | HR 67 | Temp 98.6°F | Resp 11 | Ht 60.5 in | Wt 104.0 lb

## 2016-04-29 DIAGNOSIS — Z1211 Encounter for screening for malignant neoplasm of colon: Secondary | ICD-10-CM

## 2016-04-29 DIAGNOSIS — Z1212 Encounter for screening for malignant neoplasm of rectum: Secondary | ICD-10-CM | POA: Diagnosis not present

## 2016-04-29 MED ORDER — SODIUM CHLORIDE 0.9 % IV SOLN: 500.0000 mL | INTRAVENOUS | Status: DC

## 2016-04-29 NOTE — Op Note (Signed)
[facility] Patient Name: Alexis Weaver Procedure Date: 04/29/2016 2:51 PM MRN: [id number] Endoscopist: Meryl DareMalcolm T [name] , MD Age: 65 Referring MD:  Date of Birth: February 21, 1951 Gender: Female Account #: [id number] Procedure:                Colonoscopy Indications:              Screening for colorectal malignant neoplasm Medicines:                Monitored Anesthesia Care Procedure:                Pre-Anesthesia Assessment:                           - Prior to the procedure, a History and Physical                            was performed, and patient medications and                            allergies were reviewed. The patient's tolerance of                            previous anesthesia was also reviewed. The risks                            and benefits of the procedure and the sedation                            options and risks were discussed with the patient.                            All questions were answered, and informed consent                            was obtained. Prior Anticoagulants: The patient has                            taken no previous anticoagulant or antiplatelet                            agents. ASA Grade Assessment: II - A patient with                            mild systemic disease. After reviewing the risks                            and benefits, the patient was deemed in                            satisfactory condition to undergo the procedure.                           After obtaining informed consent, the colonoscope  was passed under direct vision. Throughout the                            procedure, the patient's blood pressure, pulse, and                            oxygen saturations were monitored continuously. The                            Model PCF-H190DL [id number]) scope was introduced                            through the anus and advanced to the the cecum,                            identified by  appendiceal orifice and ileocecal                            valve. The ileocecal valve, appendiceal orifice,                            and rectum were photographed. The quality of the                            bowel preparation was adequate after extensive                            lavage and suctioning. The colonoscopy was                            performed without difficulty. The patient tolerated                            the procedure well. Scope In: 3:09:02 PM Scope Out: 3:26:38 PM Scope Withdrawal Time: 0 hours 13 minutes 42 seconds  Total Procedure Duration: 0 hours 17 minutes 36 seconds  Findings:                 The perianal and digital rectal examinations were                            normal.                           A diffuse area of mild melanosis was found in the                            entire colon.                           The exam was otherwise without abnormality on                            direct and retroflexion views. Complications:            No immediate complications. Estimated blood  loss:                            None. Estimated Blood Loss:     Estimated blood loss: none. Impression:               - Melanosis in the colon.                           - The examination was otherwise normal on direct                            and retroflexion views.                           - No specimens collected. Recommendation:           - Repeat colonoscopy in 10 years for screening                            purposes with a more extensive bowel prep.                           - Patient has a contact number available for                            emergencies. The signs and symptoms of potential                            delayed complications were discussed with the                            patient. Return to normal activities tomorrow.                            Written discharge instructions were provided to the                            patient.                            - Resume previous diet.                           - Continue present medications. Meryl Dare, MD 04/29/2016 3:30:28 PM This report has been signed electronically.

## 2016-04-29 NOTE — Progress Notes (Signed)
To recovery, report to Scott, RN, VSS 

## 2016-04-29 NOTE — Patient Instructions (Addendum)
YOU HAD AN ENDOSCOPIC PROCEDURE TODAY AT [facility]:   Refer to the procedure report that was given to you for any specific questions about what was found during the examination.  If the procedure report does not answer your questions, please call your gastroenterologist to clarify.  If you requested that your care partner not be given the details of your procedure findings, then the procedure report has been included in a sealed envelope for you to review at your convenience later.  YOU SHOULD EXPECT: Some feelings of bloating in the abdomen. Passage of more gas than usual.  Walking can help get rid of the air that was put into your GI tract during the procedure and reduce the bloating. If you had a lower endoscopy (such as a colonoscopy or flexible sigmoidoscopy) you may notice spotting of blood in your stool or on the toilet paper. If you underwent a bowel prep for your procedure, you may not have a normal bowel movement for a few days.  Please Note:  You might notice some irritation and congestion in your nose or some drainage.  This is from the oxygen used during your procedure.  There is no need for concern and it should clear up in a day or so.  SYMPTOMS TO REPORT IMMEDIATELY:   Following lower endoscopy (colonoscopy or flexible sigmoidoscopy):  Excessive amounts of blood in the stool  Significant tenderness or worsening of abdominal pains  Swelling of the abdomen that is new, acute  Fever of 100F or higher   Following upper endoscopy (EGD)  Vomiting of blood or coffee ground material  New chest pain or pain under the shoulder blades  Painful or persistently difficult swallowing  New shortness of breath  Fever of 100F or higher  Black, tarry-looking stools  For urgent or emergent issues, a gastroenterologist can be reached at any hour by calling [phone].   DIET:  We do recommend a small meal at first, but then you may proceed to your regular diet.  Drink  plenty of fluids but you should avoid alcoholic beverages for 24 hours.  ACTIVITY:  You should plan to take it easy for the rest of today and you should NOT DRIVE or use heavy machinery until tomorrow (because of the sedation medicines used during the test).    FOLLOW UP: Our staff will call the number listed on your records the next business day following your procedure to check on you and address any questions or concerns that you may have regarding the information given to you following your procedure. If we do not reach you, we will leave a message.  However, if you are feeling well and you are not experiencing any problems, there is no need to return our call.  We will assume that you have returned to your regular daily activities without incident.  If any biopsies were taken you will be contacted by phone or by letter within the next 1-3 weeks.  Please call us at [phone] if you have not heard about the biopsies in 3 weeks.    SIGNATURES/CONFIDENTIALITY: You and/or your care partner have signed paperwork which will be entered into your electronic medical record.  These signatures attest to the fact that that the information above on your After Visit Summary has been reviewed and is understood.  Full responsibility of the confidentiality of this discharge information lies with you and/or your care-partner.YOU HAD AN ENDOSCOPIC PROCEDURE TODAY AT [facility]:  Refer to the procedure report that was given to you for any specific questions about what was found during the examination.  If the procedure report does not answer your questions, please call your gastroenterologist to clarify.  If you requested that your care partner not be given the details of your procedure findings, then the procedure report has been included in a sealed envelope for you to review at your convenience later.  YOU SHOULD EXPECT: Some feelings of bloating in the abdomen. Passage of more gas than  usual.  Walking can help get rid of the air that was put into your GI tract during the procedure and reduce the bloating. If you had a lower endoscopy (such as a colonoscopy or flexible sigmoidoscopy) you may notice spotting of blood in your stool or on the toilet paper. If you underwent a bowel prep for your procedure, you may not have a normal bowel movement for a few days.  Please Note:  You might notice some irritation and congestion in your nose or some drainage.  This is from the oxygen used during your procedure.  There is no need for concern and it should clear up in a day or so.  SYMPTOMS TO REPORT IMMEDIATELY:   Following lower endoscopy (colonoscopy or flexible sigmoidoscopy):  Excessive amounts of blood in the stool  Significant tenderness or worsening of abdominal pains  Swelling of the abdomen that is new, acute  Fever of 100F or higher   Following upper endoscopy (EGD)  Vomiting of blood or coffee ground material  New chest pain or pain under the shoulder blades  Painful or persistently difficult swallowing  New shortness of breath  Fever of 100F or higher  Black, tarry-looking stools  For urgent or emergent issues, a gastroenterologist can be reached at any hour by calling [phone].   DIET:  We do recommend a small meal at first, but then you may proceed to your regular diet.  Drink plenty of fluids but you should avoid alcoholic beverages for 24 hours.  ACTIVITY:  You should plan to take it easy for the rest of today and you should NOT DRIVE or use heavy machinery until tomorrow (because of the sedation medicines used during the test).    FOLLOW UP: Our staff will call the number listed on your records the next business day following your procedure to check on you and address any questions or concerns that you may have regarding the information given to you following your procedure. If we do not reach you, we will leave a message.  However, if you are feeling  well and you are not experiencing any problems, there is no need to return our call.  We will assume that you have returned to your regular daily activities without incident.  If any biopsies were taken you will be contacted by phone or by letter within the next 1-3 weeks.  Please call us at [phone] if you have not heard about the biopsies in 3 weeks.    SIGNATURES/CONFIDENTIALITY: You and/or your care partner have signed paperwork which will be entered into your electronic medical record.  These signatures attest to the fact that that the information above on your After Visit Summary has been reviewed and is understood.  Full responsibility of the confidentiality of this discharge information lies with you and/or your care-partner.  Recall 10 years-2027.

## 2016-04-30 ENCOUNTER — Telehealth: Payer: Self-pay

## 2016-04-30 ENCOUNTER — Ambulatory Visit (INDEPENDENT_AMBULATORY_CARE_PROVIDER_SITE_OTHER): Payer: 59 | Admitting: Internal Medicine

## 2016-04-30 ENCOUNTER — Encounter: Payer: Self-pay | Admitting: Internal Medicine

## 2016-04-30 VITALS — BP 108/60 | HR 88 | Temp 98.0°F | Resp 16 | Ht 60.5 in | Wt 105.0 lb

## 2016-04-30 DIAGNOSIS — J0140 Acute pansinusitis, unspecified: Secondary | ICD-10-CM

## 2016-04-30 DIAGNOSIS — J0410 Acute tracheitis without obstruction: Secondary | ICD-10-CM

## 2016-04-30 MED ORDER — BENZONATATE 200 MG PO CAPS: ORAL_CAPSULE | ORAL | 1 refills | Status: AC

## 2016-04-30 MED ORDER — AZITHROMYCIN 250 MG PO TABS: ORAL_TABLET | ORAL | 2 refills | Status: AC

## 2016-04-30 MED ORDER — PREDNISONE 20 MG PO TABS: ORAL_TABLET | ORAL | 1 refills | Status: DC

## 2016-04-30 NOTE — Progress Notes (Signed)
[facility]   Lucky CowboyWilliam Fitzhugh Vizcarrondo, M.D.    Dyanne CarrelAmanda R. Steffanie Dunnollier, P.A.-C      Terri Piedraourtney Forcucci, P.A.-C  [facility]                [address]                [place], [place] [id number] Telephone [phone] Telefax [phone] Subjective:    Patient ID: Alexis FabryDeborah S Weaver, female    DOB: 12/03/1950, 65 y.o.   MRN: [id number]  HPI  This very nice 65 yo MWF with hx/o sinusitis presents with hx/o treated 1 mo ago at a [facility] with Augmentin and putrid nasal secretions improved , but never completely resolved and not has increasing head & chest congestion and purulent sputum and nasal drainage. No fever, dyspnea or dyspnea, but has cough interfering with sleep.   Medication Sig  . ADVAIR DISKUS 250-50 MCG/DOSE AEPB inhale 1 puff INTO THE LUNGS twice a day if needed for shortness of breath  . almotriptan (AXERT) 12.5 MG tablet take 1 by mouth if needed for MIGRAINE;  MAY REPEAT IF NEEDED  (MAXIMUM OF 2 TABLETS IN 24 HOURS)  . ALPRAZolam (XANAX) 0.25 MG tablet take 1 tablet by mouth three times a day if needed for anxiety  . AMITIZA 24 MCG capsule take 1 capsule by mouth twice a day  . benzonatate (TESSALON) 100 MG capsule   . Biotin 1000 MCG tablet Take 1,000 mcg by mouth daily. Reported on 10/18/2015  . cycloSPORINE (RESTASIS) 0.05 % ophthalmic emulsion Place 1 drop into both eyes 2 (two) times daily.    Marland Kitchen. dextroamphetamine (DEXEDRINE SPANSULE) 10 MG 24 hr capsule Take 10 mg by mouth daily.   Marland Kitchen. docusate sodium (COLACE) 100 MG capsule Take 100 mg by mouth as needed for mild constipation.  . Esomeprazole Magnesium (NEXIUM 24HR) 20 MG TBEC Take 1 tablet by mouth 2 (two) times daily.  . fluticasone (FLONASE) 50 MCG/ACT nasal spray   . LATISSE 0.03 % ophthalmic solution PLACE 1 DROP ON APPLICATOR AND APPLY EVENLY ALONG THE SKIN OF THE UPPER EYELID AT BASE OF EYELASHES ONCE DAILY AT BEDTIME REPEAT PROCEDURE F  . loratadine (CLARITIN) 10  MG tablet Take 10 mg by mouth daily.  . magnesium gluconate (MAGONATE) 500 MG tablet Take 500 mg by mouth daily.  . montelukast (SINGULAIR) 10 MG tablet take 1 tablet by mouth once daily  . ondansetron (ZOFRAN) 8 MG tablet Take 1 tablet (8 mg total) by mouth every 8 (eight) hours as needed for nausea or vomiting.  . Probiotic Product (PROBIOTIC DAILY PO) Take 1 tablet by mouth daily.  . ranitidine (ZANTAC) 150 MG tablet Take 150 mg by mouth 2 (two) times daily.  . RELPAX 40 MG tablet TAKE 1 TABLET BY MOUTH IMMEDIATELY IF NEEDED FOR HEADACHE AND MAY REPEAT ONE TIME AFTER 2 HOURS (MAXIUM OF 2 TABLETS IN 24 HOURS)  . SAVELLA 100 MG TABS tablet take 1 to 2 tablets by mouth once daily as directed for MOOD and depression  . Sennosides (SENOKOT PO) Take 1 tablet by mouth daily.  Marland Kitchen. terbinafine (LAMISIL) 250 MG tablet Take 250 mg by mouth daily.  . traZODone (DESYREL) 50 MG tablet Take 25 mg by mouth at bedtime.  . triamterene-hydrochlorothiazide (MAXZIDE) 75-50 MG tablet take 1 tablet by mouth once daily for high blood pressure and fluid retention (Patient taking differently: take 1 tablet by mouth once daily as needed)  .  VITAMIN D, ERGOCALCIFEROL, PO Take by mouth. 5,000 IU daily  . VIVELLE-DOT 0.05 MG/24HR patch apply 1 patch two times a week   Allergies  Allergen Reactions  . Hydrocodone-Acetaminophen Shortness Of Breath  . Codeine Hives  . Morphine And Related Hives  . Nitrofurantoin Other (See Comments)    Makes hands and feet burn   Past Medical History:  Diagnosis Date  . Allergy   . Anemia 2016  . Anxiety   . Asthma   . Chronic headaches   . Chronic sinusitis   . Depression   . Fibromyalgia   . GERD (gastroesophageal reflux disease)   . IBS (irritable bowel syndrome)   . Meckel diverticulum 1997  . Meckel's diverticulum   . Neuromuscular disorder (HCC)    fibromyagia  . Osteopenia    Review of Systems  10 point systems review negative except as above.    Objective:    Physical Exam  BP 108/60   Pulse 88   Temp 98 F (36.7 C) (Temporal)   Resp 16   Ht 5' 0.5" (1.537 m)   Wt 105 lb (47.6 kg)   SpO2 98%   BMI 20.17 kg/m   Voice hoarse and tracheal cough. No Stridor.   HEENT - Eac's patent. TM's Nl. EOM's full. PERRLA.  (+) tender maxillary > frontal sinuses. NasoOroPharynx clear. Neck - supple. Nl Thyroid. Carotids 2+ & No bruits, nodes, JVD Chest - Few scattered dry coarse rales & rhonchi and no wheezes. Cor - Nl HS. RRR w/o sig MGR. PP 1(+). MS-Muscle power, tone and bulk Nl. Gait Nl. Neuro - Nl w/o focal abnormalities. Skin - exposed unremarkable w/o rash, cyanosis or pallor.    Assessment & Plan:   1. Tracheitis  - predniSONE (DELTASONE) 20 MG tablet; 1 tab 3 x day for 3 days, then 1 tab 2 x day for 3 days, then 1 tab 1 x day for 5 days  Dispense: 20 tablet; Refill: 1 - benzonatate (TESSALON) 200 MG capsule; Take 1 perle 3 x/day as needed to prevent cough  Dispense: 60 capsule; Refill: 1 - azithromycin (ZITHROMAX) 250 MG tablet; Take 2 tablets (500 mg) on  Day 1,  followed by 1 tablet (250 mg) once daily on Days 2 through 5.  Dispense: 6 each; Refill: 2  2. Acute pansinusitis, recurrence not specified  - predniSONE (DELTASONE) 20 MG tablet; 1 tab 3 x day for 3 days, then 1 tab 2 x day for 3 days, then 1 tab 1 x day for 5 days  Dispense: 20 tablet; Refill: 1 - azithromycin (ZITHROMAX) 250 MG tablet; Take 2 tablets (500 mg) on  Day 1,  followed by 1 tablet (250 mg) once daily on Days 2 through 5.  Dispense: 6 each; Refill: 2  - discussed meds/SE's and encouraged steam.

## 2016-04-30 NOTE — Telephone Encounter (Signed)
  Follow up Call-  Call back number 04/29/2016 10/18/2015  Post procedure Call Back phone  # [phone]  Permission to leave phone message Yes Yes  Some recent data might be hidden     Patient questions:  Do you have a fever, pain , or abdominal swelling? No. Pain Score  0 *  Have you tolerated food without any problems? No.  Have you been able to return to your normal activities? Yes.    Do you have any questions about your discharge instructions: Diet   No. Medications  No. Follow up visit  No.  Do you have questions or concerns about your Care? No.  Actions: * If pain score is 4 or above: No action needed, pain <4.  Per pt she had abdominal cramps and bloating.  Pt said she did finally pass the gas.  This am she feels much better.  maw

## 2016-05-15 ENCOUNTER — Other Ambulatory Visit: Payer: Self-pay | Admitting: Internal Medicine

## 2016-05-19 ENCOUNTER — Other Ambulatory Visit: Payer: Self-pay | Admitting: Internal Medicine

## 2016-06-05 ENCOUNTER — Other Ambulatory Visit: Payer: Self-pay | Admitting: Internal Medicine

## 2016-06-13 ENCOUNTER — Other Ambulatory Visit: Payer: Self-pay | Admitting: Internal Medicine

## 2016-07-10 ENCOUNTER — Other Ambulatory Visit: Payer: Self-pay | Admitting: Physician Assistant

## 2016-07-22 ENCOUNTER — Other Ambulatory Visit: Payer: Self-pay | Admitting: Internal Medicine

## 2016-07-27 ENCOUNTER — Other Ambulatory Visit: Payer: Self-pay | Admitting: Internal Medicine

## 2016-08-09 ENCOUNTER — Encounter: Payer: Self-pay | Admitting: Internal Medicine

## 2016-08-24 ENCOUNTER — Encounter: Payer: Self-pay | Admitting: Physician Assistant

## 2016-08-24 ENCOUNTER — Ambulatory Visit (INDEPENDENT_AMBULATORY_CARE_PROVIDER_SITE_OTHER): Payer: Medicare Other | Admitting: Physician Assistant

## 2016-08-24 VITALS — BP 136/62 | HR 68 | Temp 97.9°F | Resp 16 | Ht 61.5 in | Wt 106.2 lb

## 2016-08-24 DIAGNOSIS — R7303 Prediabetes: Secondary | ICD-10-CM | POA: Diagnosis not present

## 2016-08-24 DIAGNOSIS — R6889 Other general symptoms and signs: Secondary | ICD-10-CM

## 2016-08-24 DIAGNOSIS — F3341 Major depressive disorder, recurrent, in partial remission: Secondary | ICD-10-CM

## 2016-08-24 DIAGNOSIS — J320 Chronic maxillary sinusitis: Secondary | ICD-10-CM

## 2016-08-24 DIAGNOSIS — D649 Anemia, unspecified: Secondary | ICD-10-CM | POA: Diagnosis not present

## 2016-08-24 DIAGNOSIS — F419 Anxiety disorder, unspecified: Secondary | ICD-10-CM

## 2016-08-24 DIAGNOSIS — M8580 Other specified disorders of bone density and structure, unspecified site: Secondary | ICD-10-CM

## 2016-08-24 DIAGNOSIS — J45909 Unspecified asthma, uncomplicated: Secondary | ICD-10-CM

## 2016-08-24 DIAGNOSIS — Z13220 Encounter for screening for lipoid disorders: Secondary | ICD-10-CM

## 2016-08-24 DIAGNOSIS — R002 Palpitations: Secondary | ICD-10-CM | POA: Diagnosis not present

## 2016-08-24 DIAGNOSIS — K589 Irritable bowel syndrome without diarrhea: Secondary | ICD-10-CM

## 2016-08-24 DIAGNOSIS — M25511 Pain in right shoulder: Secondary | ICD-10-CM | POA: Diagnosis not present

## 2016-08-24 DIAGNOSIS — M25512 Pain in left shoulder: Secondary | ICD-10-CM

## 2016-08-24 DIAGNOSIS — Z0000 Encounter for general adult medical examination without abnormal findings: Secondary | ICD-10-CM

## 2016-08-24 DIAGNOSIS — Z79899 Other long term (current) drug therapy: Secondary | ICD-10-CM | POA: Diagnosis not present

## 2016-08-24 DIAGNOSIS — Z0001 Encounter for general adult medical examination with abnormal findings: Secondary | ICD-10-CM

## 2016-08-24 DIAGNOSIS — E559 Vitamin D deficiency, unspecified: Secondary | ICD-10-CM

## 2016-08-24 LAB — CBC WITH DIFFERENTIAL/PLATELET
BASOS ABS: 82 {cells}/uL (ref 0–200)
Basophils Relative: 2 %
EOS PCT: 1 %
Eosinophils Absolute: 41 cells/uL (ref 15–500)
HCT: 40.4 % (ref 35.0–45.0)
Hemoglobin: 12.8 g/dL (ref 11.7–15.5)
Lymphocytes Relative: 38 %
Lymphs Abs: 1558 cells/uL (ref 850–3900)
MCH: 28.3 pg (ref 27.0–33.0)
MCHC: 31.7 g/dL — AB (ref 32.0–36.0)
MCV: 89.2 fL (ref 80.0–100.0)
MONOS PCT: 10 %
MPV: 10.4 fL (ref 7.5–12.5)
Monocytes Absolute: 410 cells/uL (ref 200–950)
NEUTROS ABS: 2009 {cells}/uL (ref 1500–7800)
Neutrophils Relative %: 49 %
PLATELETS: 309 10*3/uL (ref 140–400)
RBC: 4.53 MIL/uL (ref 3.80–5.10)
RDW: 15.4 % — ABNORMAL HIGH (ref 11.0–15.0)
WBC: 4.1 10*3/uL (ref 3.8–10.8)

## 2016-08-24 LAB — LIPID PANEL
CHOLESTEROL: 206 mg/dL — AB (ref <200)
HDL: 116 mg/dL (ref >50)
LDL Cholesterol: 79 mg/dL (ref <100)
TRIGLYCERIDES: 53 mg/dL (ref <150)
Total CHOL/HDL Ratio: 1.8 Ratio (ref <5.0)
VLDL: 11 mg/dL (ref <30)

## 2016-08-24 LAB — BASIC METABOLIC PANEL WITH GFR
BUN: 13 mg/dL (ref 7–25)
CALCIUM: 9 mg/dL (ref 8.6–10.4)
CO2: 32 mmol/L — ABNORMAL HIGH (ref 20–31)
CREATININE: 0.74 mg/dL (ref 0.50–0.99)
Chloride: 98 mmol/L (ref 98–110)
GFR, Est African American: >89 mL/min (ref >=60)
GFR, Est Non African American: 85 mL/min (ref >=60)
Glucose, Bld: 95 mg/dL (ref 65–99)
Potassium: 4 mmol/L (ref 3.5–5.3)
SODIUM: 137 mmol/L (ref 135–146)

## 2016-08-24 LAB — HEPATIC FUNCTION PANEL
ALBUMIN: 4.3 g/dL (ref 3.6–5.1)
ALT: 15 U/L (ref 6–29)
AST: 20 U/L (ref 10–35)
Alkaline Phosphatase: 58 U/L (ref 33–130)
BILIRUBIN INDIRECT: 0.4 mg/dL (ref 0.2–1.2)
BILIRUBIN TOTAL: 0.5 mg/dL (ref 0.2–1.2)
Bilirubin, Direct: 0.1 mg/dL (ref <=0.2)
TOTAL PROTEIN: 6.6 g/dL (ref 6.1–8.1)

## 2016-08-24 LAB — TSH: TSH: 1.52 mIU/L

## 2016-08-24 MED ORDER — LEVOCETIRIZINE DIHYDROCHLORIDE 5 MG PO TABS: 5.0000 mg | ORAL_TABLET | Freq: Every evening | ORAL | 3 refills | Status: DC

## 2016-08-24 NOTE — Progress Notes (Signed)
MEDICARE ANNUAL WELLNESS VISIT AND CPE  Assessment:   Prediabetes Discussed general issues about diabetes pathophysiology and management., Educational material distributed., Suggested low cholesterol diet., Encouraged aerobic exercise., Discussed foot care., Reminded to get yearly retinal exam. - Lipid panel - Hemoglobin A1c - Insulin, fasting  Osteopenia Up to date  Chronic maxillary sinusitis monitor   Palpitations Better will decreased stress   Vitamin D deficiency - Vit D  25 hydroxy (rtn osteoporosis monitoring)  Encounter for long-term (current) use of medications - CBC with Differential/Platelet - BASIC METABOLIC PANEL WITH GFR - Hepatic function panel - Magnesium  Major Depression in partial remission Follow up Dr. Lyanne Co, no SI/HI - TSH   Anxiety Follow up Dr. Lyanne Co   IBS (irritable bowel syndrome)-constipation type Continue amitza  Asthma, chronic, unspecified asthma severity, uncomplicated No signs of infection at this time, will check CBC, get back on advair and    Anemia, unspecified anemia type  Over 40 minutes of exam, counseling, chart review and critical decision making was performed Future Appointments Date Time Provider Department Center  02/23/2017 10:00 AM Quentin Mulling, PA-C [id number] None     Plan:   During the course of the visit the patient was educated and counseled about appropriate screening and preventive services including:    Pneumococcal vaccine   Prevnar 13  Influenza vaccine  Td vaccine  Screening electrocardiogram  Bone densitometry screening  Colorectal cancer screening  Diabetes screening  Glaucoma screening  Nutrition counseling   Advanced directives: requested   Subjective:  Alexis Weaver is a 66 y.o. female who presents for [facility] Annual Wellness Visit and follow up   Her blood pressure has been controlled at home, today their BP is BP: 136/62 She does workout. She denies chest  pain, shortness of breath, dizziness.  She is not on cholesterol medication and denies myalgias. Her cholesterol is at goal. The cholesterol last visit was:   Lab Results  Component Value Date   CHOL 206 (H) 08/24/2016   HDL 116 08/24/2016   LDLCALC 79 08/24/2016   TRIG 53 08/24/2016   CHOLHDL 1.8 08/24/2016   Last W0J  Lab Results  Component Value Date   HGBA1C 5.5 08/24/2016    Lab Results  Component Value Date   GFRNONAA 85 08/24/2016   Patient is on Vitamin D supplement.   Lab Results  Component Value Date   VD25OH 72 08/24/2016     She has had negative ANA, AntiDNA, RF.  She is on estrogen therapy and on bASA.  She sees a therapist 3 x a week and Dr. Evelene Croon. She states she is taking xanax just at night for sleep, she is taking her brother to court to have him deemed incompentant in April however this has increased stress on her, she is interested in switching he xanax to a different sleep med but is afraid to do it during this time. Her son is doing better with addition, has GF, and husband is still struggling with his back issues, wrestling to do surgery or not.  Sees Dr. Ezzard Standing and Bearden Callas for allergies/ashtma.   Medication Review: Current Outpatient Prescriptions on File Prior to Visit  Medication Sig Dispense Refill  . ADVAIR DISKUS 250-50 MCG/DOSE AEPB inhale 1 puff INTO THE LUNGS twice a day if needed for shortness of breath 60 each 11  . almotriptan (AXERT) 12.5 MG tablet take 1 by mouth if needed for MIGRAINE;  MAY REPEAT IF NEEDED  (MAXIMUM OF 2 TABLETS IN  24 HOURS) 12 tablet 2  . ALPRAZolam (XANAX) 0.25 MG tablet take 1 tablet by mouth twice a day if needed 60 tablet 3  . AMITIZA 24 MCG capsule take 1 capsule by mouth twice a day 180 capsule 1  . Biotin 1000 MCG tablet Take 1,000 mcg by mouth daily. Reported on 10/18/2015    . cycloSPORINE (RESTASIS) 0.05 % ophthalmic emulsion Place 1 drop into both eyes 2 (two) times daily.      Marland Kitchen. dextroamphetamine (DEXEDRINE  SPANSULE) 10 MG 24 hr capsule Take 10 mg by mouth daily.   0  . docusate sodium (COLACE) 100 MG capsule Take 100 mg by mouth as needed for mild constipation.    . Esomeprazole Magnesium (NEXIUM 24HR) 20 MG TBEC Take 1 tablet by mouth 2 (two) times daily. 60 tablet 11  . fluticasone (FLONASE) 50 MCG/ACT nasal spray instill 2 sprays into each nostril once daily 16 g 3  . LATISSE 0.03 % ophthalmic solution PLACE 1 DROP ON APPLICATOR AND APPLY EVENLY ALONG THE SKIN OF THE UPPER EYELID AT BASE OF EYELASHES ONCE DAILY AT BEDTIME REPEAT PROCEDURE F 3 mL 11  . loratadine (CLARITIN) 10 MG tablet Take 10 mg by mouth daily.    . magnesium gluconate (MAGONATE) 500 MG tablet Take 500 mg by mouth daily.    . montelukast (SINGULAIR) 10 MG tablet take 1 tablet by mouth once daily 30 tablet 3  . ondansetron (ZOFRAN) 8 MG tablet take 1 tablet by mouth every 8 hours if needed for nausea and vomiting 100 tablet 3  . Probiotic Product (PROBIOTIC DAILY PO) Take 1 tablet by mouth daily.    . ranitidine (ZANTAC) 150 MG tablet Take 150 mg by mouth 2 (two) times daily.    . RELPAX 40 MG tablet take 1 tablet by mouth immediately if needed for HEADACHE, and may repeat ONCE after 2 hours  (MAXIMUM OF 2 TABLETS IN 24 HOURS) 6 tablet 1  . SAVELLA 100 MG TABS tablet take 1 to 2 tablets by mouth once daily as directed for MOOD and depression 60 tablet 6  . Sennosides (SENOKOT PO) Take 1 tablet by mouth daily.    Marland Kitchen. terbinafine (LAMISIL) 250 MG tablet Take 250 mg by mouth daily.    . traZODone (DESYREL) 50 MG tablet Take 25 mg by mouth at bedtime.    . triamterene-hydrochlorothiazide (MAXZIDE) 75-50 MG tablet take 1 tablet by mouth once daily for high blood pressure and fluid retention (Patient taking differently: take 1 tablet by mouth once daily as needed) 31 tablet PRN  . VITAMIN D, ERGOCALCIFEROL, PO Take by mouth. 5,000 IU daily    . VIVELLE-DOT 0.05 MG/24HR patch apply 1 patch two times a week 12 patch 3   Current  Facility-Administered Medications on File Prior to Visit  Medication Dose Route Frequency Provider Last Rate Last Dose  . 0.9 %  sodium chloride infusion  500 mL Intravenous Continuous Meryl DareMalcolm T Stark, MD        Allergies  Allergen Reactions  . Hydrocodone-Acetaminophen Shortness Of Breath  . Codeine Hives  . Morphine And Related Hives  . Nitrofurantoin Other (See Comments)    Makes hands and feet burn    Current Problems (verified) Patient Active Problem List   Diagnosis Date Noted  . Anemia 07/21/2015  . Prediabetes 06/26/2014  . Medication management 06/26/2014  . IBS (irritable bowel syndrome)-constipation type 06/26/2014  . Bilateral shoulder pain 06/26/2014  . Vitamin D deficiency 06/26/2014  .  Asthma, chronic   . Chronic sinusitis   . Anxiety   . Major depression in partial remission (HCC)   . Osteopenia   . Palpitations 04/01/2010    Screening Tests Immunization History  Administered Date(s) Administered  . DT 11/06/2004  . Influenza-Unspecified 02/14/2012  . Tdap 02/26/2011   Tetanus: 2012 Pneumovax: N/A due AFTER prevnar  Prevnar 13: out of in the office Flu vaccine: 2013 Zostavax: N/A Pap: 2016, declines another,never abnormal pap MGM: 12/2015 DUE this year DEXA: 12/2015 Colonoscopy: 2017 getting with Dr. Russella Dar EGD: 10/2015 MRI brain 2010 CXR 2015  Names of Other Physician/Practitioners you currently use: 1. [facility] here for primary care 2. [place], eye doctor, last visit 2018 3. [place], dentist, last visit 2018 Patient Care Team: [name], MD as PCP - General (Internal Medicine) Sidney Ace, MD as Referring Physician (Allergy)  SURGICAL HISTORY She  has a past surgical history that includes Abdominal hysterectomy; Cesarean section (1979, 1983); Breast enhancement surgery; Laparoscopic abdominal exploration; Hemorrhoid banding; and Appendectomy (1997). FAMILY HISTORY Her family history includes  Asthma in her brother; COPD in her brother; Diabetes in her brother and father; Diverticulitis in her maternal grandmother; Hyperlipidemia in her brother; Hypertension in her father and mother; Rheum arthritis in her sister. SOCIAL HISTORY She  reports that she has never smoked. She has never used smokeless tobacco. She reports that she does not drink alcohol or use drugs.   MEDICARE WELLNESS OBJECTIVES: Physical activity: Current Exercise Habits: The patient does not participate in regular exercise at present Cardiac risk factors: Cardiac Risk Factors include: advanced age (>12men, >43 women);hypertension Depression/mood screen:   Depression screen [facility] 2/9 08/25/2016  Decreased Interest 0  Down, Depressed, Hopeless 0  PHQ - 2 Score 0    ADLs:  In your present state of health, do you have any difficulty performing the following activities: 08/25/2016  Hearing? N  Vision? N  Difficulty concentrating or making decisions? N  Walking or climbing stairs? N  Dressing or bathing? N  Doing errands, shopping? N  Some recent data might be hidden     Cognitive Testing  Alert? Yes  Normal Appearance?Yes  Oriented to person? Yes  Place? Yes   Time? Yes  Recall of three objects?  Yes  Can perform simple calculations? Yes  Displays appropriate judgment?Yes  Can read the correct time from a watch face?Yes  EOL planning: Does Patient Have a Medical Advance Directive?: Yes Type of Advance Directive: Healthcare Power of Attorney, Living will Copy of Healthcare Power of Attorney in Chart?: No - copy requested  Review of Systems  Constitutional: Positive for malaise/fatigue. Negative for chills, diaphoresis, fever and weight loss.  HENT: Negative for congestion, ear discharge, ear pain, hearing loss, nosebleeds, sore throat and tinnitus.   Eyes: Negative.   Respiratory: Negative for cough, hemoptysis, sputum production, shortness of breath, wheezing and stridor.   Cardiovascular: Negative.    Gastrointestinal: Positive for constipation (on amitiza) and heartburn (better on nexium). Negative for abdominal pain, blood in stool, diarrhea, melena, nausea and vomiting.  Genitourinary: Positive for frequency. Negative for dysuria, flank pain, hematuria and urgency.  Musculoskeletal: Positive for myalgias. Negative for back pain, falls, joint pain and neck pain.  Skin: Negative.   Neurological: Negative.  Negative for weakness and headaches.  Psychiatric/Behavioral: Positive for depression. Negative for hallucinations, memory loss, substance abuse and suicidal ideas. The patient is not nervous/anxious.      Objective:     Today's Vitals  08/24/16 1037  BP: 136/62  Pulse: 68  Resp: 16  Temp: 97.9 F (36.6 C)  SpO2: 96%  Weight: 106 lb 3.2 oz (48.2 kg)  Height: 5' 1.5" (1.562 m)  PainSc: 1    Body mass index is 19.74 kg/m.  General appearance: alert, no distress, WD/WN, female HEENT: normocephalic, sclerae anicteric, TMs pearly, nares patent, no discharge or erythema, pharynx normal Oral cavity: MMM, no lesions Neck: supple, no lymphadenopathy, no thyromegaly, no masses Heart: RRR, normal S1, S2, no murmurs Lungs: CTA bilaterally, no wheezes, rhonchi, or rales Abdomen: +bs, soft, non tender, non distended, no masses, no hepatomegaly, no splenomegaly Musculoskeletal: nontender, no swelling, no obvious deformity Extremities: no edema, no cyanosis, no clubbing Pulses: 2+ symmetric, upper and lower extremities, normal cap refill Neurological: alert, oriented x 3, CN2-12 intact, strength normal upper extremities and lower extremities, sensation normal throughout, DTRs 2+ throughout, no cerebellar signs, gait normal Psychiatric: normal affect, behavior normal, pleasant   Medicare Attestation I have personally reviewed: The patient's medical and social history Their use of alcohol, tobacco or illicit drugs Their current medications and supplements The patient's functional  ability including ADLs,fall risks, home safety risks, cognitive, and hearing and visual impairment Diet and physical activities Evidence for depression or mood disorders  The patient's weight, height, BMI, and visual acuity have been recorded in the chart.  I have made referrals, counseling, and provided education to the patient based on review of the above and I have provided the patient with a written personalized care plan for preventive services.     Quentin Mulling, PA-C   08/25/2016

## 2016-08-24 NOTE — Patient Instructions (Addendum)
Savella to the morning Xyzal at night for sleep Can do xanax for now  11 Tips to Follow:  1. No caffeine after 3pm: Avoid beverages with caffeine (soda, tea, energy drinks, etc.) especially after 3pm. 2. Don't go to bed hungry: Have your evening meal at least 3 hrs. before going to sleep. It's fine to have a small bedtime snack such as a glass of milk and a few crackers but don't have a big meal. 3. Have a nightly routine before bed: Plan on "winding down" before you go to sleep. Begin relaxing about 1 hour before you go to bed. Try doing a quiet activity such as listening to calming music, reading a book or meditating. 4. Turn off the TV and ALL electronics including video games, tablets, laptops, etc. 1 hour before sleep, and keep them out of the bedroom. 5. Turn off your cell phone and all notifications (new email and text alerts) or even better, leave your phone outside your room while you sleep. Studies have shown that a part of your brain continues to respond to certain lights and sounds even while you're still asleep. 6. Make your bedroom quiet, dark and cool. If you can't control the noise, try wearing earplugs or using a fan to block out other sounds. 7. Practice relaxation techniques. Try reading a book or meditating or drain your brain by writing a list of what you need to do the next day. 8. Don't nap unless you feel sick: you'll have a better night's sleep. 9. Don't smoke, or quit if you do. Nicotine, alcohol, and marijuana can all keep you awake. Talk to your health care provider if you need help with substance use. 10. Most importantly, wake up at the same time every day (or within 1 hour of your usual wake up time) EVEN on the weekends. A regular wake up time promotes sleep hygiene and prevents sleep problems. 11. Reduce exposure to bright light in the last three hours of the day before going to sleep. Maintaining good sleep hygiene and having good sleep habits lower your risk of  developing sleep problems. Getting better sleep can also improve your concentration and alertness. Try the simple steps in this guide. If you still have trouble getting enough rest, make an appointment with your health care provider.

## 2016-08-25 ENCOUNTER — Encounter: Payer: Self-pay | Admitting: Physician Assistant

## 2016-08-25 LAB — VITAMIN D 25 HYDROXY (VIT D DEFICIENCY, FRACTURES): VIT D 25 HYDROXY: 72 ng/mL (ref 30–100)

## 2016-08-25 LAB — MAGNESIUM: MAGNESIUM: 1.9 mg/dL (ref 1.5–2.5)

## 2016-08-25 LAB — HEMOGLOBIN A1C
Hgb A1c MFr Bld: 5.5 % (ref <5.7)
Mean Plasma Glucose: 111 mg/dL

## 2016-09-15 ENCOUNTER — Other Ambulatory Visit: Payer: Self-pay | Admitting: *Deleted

## 2016-09-15 MED ORDER — ESTRADIOL 0.05 MG/24HR TD PTTW: 1.0000 | MEDICATED_PATCH | TRANSDERMAL | 12 refills | Status: DC

## 2016-09-27 ENCOUNTER — Other Ambulatory Visit: Payer: Self-pay | Admitting: Internal Medicine

## 2016-10-07 ENCOUNTER — Other Ambulatory Visit: Payer: Self-pay | Admitting: Physician Assistant

## 2016-11-10 ENCOUNTER — Encounter: Payer: Self-pay | Admitting: Physician Assistant

## 2016-11-10 ENCOUNTER — Ambulatory Visit (INDEPENDENT_AMBULATORY_CARE_PROVIDER_SITE_OTHER): Payer: Medicare Other | Admitting: Physician Assistant

## 2016-11-10 VITALS — BP 128/72 | HR 94 | Temp 97.7°F | Resp 14 | Ht 61.5 in | Wt 104.4 lb

## 2016-11-10 DIAGNOSIS — J0140 Acute pansinusitis, unspecified: Secondary | ICD-10-CM

## 2016-11-10 MED ORDER — TRIAMCINOLONE ACETONIDE 0.1 % EX CREA: 1.0000 "application " | TOPICAL_CREAM | Freq: Two times a day (BID) | CUTANEOUS | 1 refills | Status: DC

## 2016-11-10 MED ORDER — PREDNISONE 20 MG PO TABS: ORAL_TABLET | ORAL | 1 refills | Status: DC

## 2016-11-10 MED ORDER — AZITHROMYCIN 250 MG PO TABS: ORAL_TABLET | ORAL | 0 refills | Status: AC

## 2016-11-10 NOTE — Patient Instructions (Addendum)
Your ears and sinuses are connected by the eustachian tube. When your sinuses are inflamed, this can close off the tube and cause fluid to collect in your middle ear. This can then cause dizziness, popping, clicking, ringing, and echoing in your ears. This is often NOT an infection and does NOT require antibiotics, it is caused by inflammation so the treatments help the inflammation. This can take a long time to get better so please be patient.  Here are things you can do to help with this: - Try the Flonase or Nasonex. Remember to spray each nostril twice towards the outer part of your eye.  Do not sniff but instead pinch your nose and tilt your head back to help the medicine get into your sinuses.  The best time to do this is at bedtime.Stop if you get blurred vision or nose bleeds.  -While drinking fluids, pinch and hold nose close and swallow, to help open eustachian tubes to drain fluid behind ear drums. -Please pick one of the over the counter allergy medications below and take it once daily for allergies.  It will also help with fluid behind ear drums. Claritin or loratadine cheapest but likely the weakest  Zyrtec or certizine at night because it can make you sleepy The strongest is allegra or fexafinadine  Cheapest at [employer], [employer], [employer] -can use decongestant over the counter, please do not use if you have high blood pressure or certain heart conditions.   if worsening HA, changes vision/speech, imbalance, weakness go to the ER   HOW TO TREAT VIRAL COUGH AND COLD SYMPTOMS:  -Symptoms usually last at least 1 week with the worst symptoms being around day 4.  - colds usually start with a sore throat and end with a cough, and the cough can take 2 weeks to get better.  -No antibiotics are needed for colds, flu, sore throats, cough, bronchitis UNLESS symptoms are longer than 7 days OR if you are getting better then get drastically worse.  -There are a lot of combination medications (Dayquil,  Nyquil, Vicks 44, tyelnol cold and sinus, ETC). Please look at the ingredients on the back so that you are treating the correct symptoms and not doubling up on medications/ingredients.    Medicines you can use  Nasal congestion  - pseudoephedrine (Sudafed)- behind the counter, do not use if you have high blood pressure, medicine that have -D in them.  - phenylephrine (Sudafed PE) -Dextormethorphan + chlorpheniramine (Coridcidin HBP)- okay if you have high blood pressure -Oxymetazoline (Afrin) nasal spray- LIMIT to 3 days -Saline nasal spray -Neti pot (used distilled or bottled water)  Ear pain/congestion  -pseudoephedrine (sudafed) - Nasonex/flonase nasal spray  Fever  -Acetaminophen (Tyelnol) -Ibuprofen (Advil, motrin, aleve)  Sore Throat  -Acetaminophen (Tyelnol) -Ibuprofen (Advil, motrin, aleve) -Drink a lot of water -Gargle with salt water - Rest your voice (don't talk) -Throat sprays -Cough drops  Body Aches  -Acetaminophen (Tyelnol) -Ibuprofen (Advil, motrin, aleve)  Headache  -Acetaminophen (Tyelnol) -Ibuprofen (Advil, motrin, aleve) - Exedrin, Exedrin Migraine  Allergy symptoms (cough, sneeze, runny nose, itchy eyes) -Claritin or loratadine cheapest but likely the weakest  -Zyrtec or certizine at night because it can make you sleepy -The strongest is allegra or fexafinadine  Cheapest at [employer], [employer], [employer]  Cough  -Dextromethorphan (Delsym)- medicine that has DM in it -Guafenesin (Mucinex/Robitussin) - cough drops - drink lots of water  Chest Congestion  -Guafenesin (Mucinex/Robitussin)  Red Itchy Eyes  - Naphcon-A  Upset Stomach  -   Bland diet (nothing spicy, greasy, fried, and high acid foods like tomatoes, oranges, berries) -OKAY- cereal, bread, soup, crackers, rice -Eat smaller more frequent meals -reduce caffeine, no alcohol -Loperamide (Imodium-AD) if diarrhea -Prevacid for heart burn  General health when sick  -Hydration -wash your  hands frequently -keep surfaces clean -change pillow cases and sheets often -Get fresh air but do not exercise strenuously -Vitamin D, double up on it - Vitamin C -Zinc       

## 2016-11-10 NOTE — Progress Notes (Signed)
Subjective:    Patient ID: Alexis Weaver, female    DOB: Jun 04, 1951, 66 y.o.   MRN: [id number]  HPI 66 y.o. WF with history of asthma presents with cough and tick bites.  2 tick bites but on her for less than 24 hours.  She has had cough, HA, non productive, nasal congestion. She is not taking advair daily, taking as needed.   Blood pressure 128/72, pulse 94, temperature 97.7 F (36.5 C), resp. rate 14, height 5' 1.5" (1.562 m), weight 104 lb 6.4 oz (47.4 kg), SpO2 98 %.  Medications Current Outpatient Prescriptions on File Prior to Visit  Medication Sig  . ADVAIR DISKUS 250-50 MCG/DOSE AEPB inhale 1 puff INTO THE LUNGS twice a day if needed for shortness of breath  . almotriptan (AXERT) 12.5 MG tablet take 1 tablet by mouth if needed migraines may repeat if needed maximum daily dose of 2  . ALPRAZolam (XANAX) 0.25 MG tablet take 1 tablet by mouth twice a day if needed  . AMITIZA 24 MCG capsule take 1 capsule by mouth twice a day  . Biotin 1000 MCG tablet Take 1,000 mcg by mouth daily. Reported on 10/18/2015  . cycloSPORINE (RESTASIS) 0.05 % ophthalmic emulsion Place 1 drop into both eyes 2 (two) times daily.    Marland Kitchen. dextroamphetamine (DEXEDRINE SPANSULE) 10 MG 24 hr capsule Take 10 mg by mouth daily.   Marland Kitchen. docusate sodium (COLACE) 100 MG capsule Take 100 mg by mouth as needed for mild constipation.  . Esomeprazole Magnesium (NEXIUM 24HR) 20 MG TBEC Take 1 tablet by mouth 2 (two) times daily.  Marland Kitchen. estradiol (VIVELLE-DOT) 0.05 MG/24HR patch Place 1 patch (0.05 mg total) onto the skin 2 (two) times a week.  . fluticasone (FLONASE) 50 MCG/ACT nasal spray instill 2 sprays into each nostril once daily  . LATISSE 0.03 % ophthalmic solution PLACE 1 DROP ON APPLICATOR AND APPLY EVENLY ALONG THE SKIN OF THE UPPER EYELID AT BASE OF EYELASHES ONCE DAILY AT BEDTIME REPEAT PROCEDURE F  . levocetirizine (XYZAL) 5 MG tablet Take 1 tablet (5 mg total) by mouth every evening.  . loratadine (CLARITIN) 10 MG  tablet Take 10 mg by mouth daily.  . magnesium gluconate (MAGONATE) 500 MG tablet Take 500 mg by mouth daily.  . montelukast (SINGULAIR) 10 MG tablet take 1 tablet by mouth once daily  . ondansetron (ZOFRAN) 8 MG tablet take 1 tablet by mouth every 8 hours if needed for nausea and vomiting  . Probiotic Product (PROBIOTIC DAILY PO) Take 1 tablet by mouth daily.  . RELPAX 40 MG tablet take 1 tablet by mouth immediately if needed for HEADACHE, and may repeat ONCE after 2 hours  (MAXIMUM OF 2 TABLETS IN 24 HOURS)  . SAVELLA 100 MG TABS tablet take 1 to 2 tablets by mouth once daily as directed for MOOD and depression  . Sennosides (SENOKOT PO) Take 1 tablet by mouth daily.  . traZODone (DESYREL) 50 MG tablet Take 25 mg by mouth at bedtime.  . triamterene-hydrochlorothiazide (MAXZIDE) 75-50 MG tablet take 1 tablet by mouth once daily for high blood pressure and fluid retention  . VITAMIN D, ERGOCALCIFEROL, PO Take by mouth. 5,000 IU daily   Current Facility-Administered Medications on File Prior to Visit  Medication  . 0.9 %  sodium chloride infusion    Problem list She has Palpitations; Asthma, chronic; Chronic sinusitis; Anxiety; Major depression in partial remission (HCC); Osteopenia; Prediabetes; Medication management; IBS (irritable bowel syndrome)-constipation type; Bilateral shoulder  pain; Vitamin D deficiency; and Anemia on her problem list.   Review of Systems  Constitutional: Negative for chills and diaphoresis.  HENT: Positive for congestion, postnasal drip, sinus pressure and sneezing. Negative for ear pain and sore throat.   Respiratory: Positive for cough. Negative for chest tightness, shortness of breath and wheezing.   Cardiovascular: Negative.   Gastrointestinal: Negative.   Genitourinary: Negative.   Musculoskeletal: Negative for neck pain.  Neurological: Positive for headaches.       Objective:   Physical Exam  Constitutional: She appears well-developed and  well-nourished.  HENT:  Head: Normocephalic and atraumatic.  Right Ear: External ear normal. Tympanic membrane is not erythematous and not retracted. A middle ear effusion is present.  Left Ear: Tympanic membrane is not erythematous and not retracted. A middle ear effusion is present.  Nose: Right sinus exhibits maxillary sinus tenderness. Right sinus exhibits no frontal sinus tenderness. Left sinus exhibits maxillary sinus tenderness (worse). Left sinus exhibits no frontal sinus tenderness.  Eyes: Conjunctivae and EOM are normal.  Dry eyes  Neck: Normal range of motion. Neck supple.  Cardiovascular: Normal rate, regular rhythm, normal heart sounds and intact distal pulses.   Pulmonary/Chest: Effort normal and breath sounds normal. No respiratory distress. She has no wheezes.  Abdominal: Soft. Bowel sounds are normal.  Lymphadenopathy:    She has no cervical adenopathy.  Skin: Skin is warm and dry.       Assessment & Plan:  1. Acute pansinusitis, recurrence not specified Will hold the zpak and take if she is not getting better, increase fluids, rest, cont allergy pill - predniSONE (DELTASONE) 20 MG tablet; 2 tablets daily for 3 days, 1 tablet daily for 4 days.  Dispense: 10 tablet; Refill: 1

## 2016-11-11 ENCOUNTER — Ambulatory Visit: Payer: Self-pay | Admitting: Physician Assistant

## 2016-12-03 NOTE — Progress Notes (Signed)
3 month OV  Assessment:   Medication management -     CBC with Differential/Platelet -     BASIC METABOLIC PANEL WITH GFR -     Hepatic function panel -     Magnesium  Cervical arthritis [facility](HCC) May need referral to ortho, will try PT -     Ambulatory referral to Physical Therapy  Chronic hand pain, unspecified laterality -     Ambulatory referral to Physical Therapy  Hyperlipidemia, unspecified hyperlipidemia type -     Lipid panel  Major Depression in partial remission Follow up Dr. Lyanne CoKeur, no SI/HI - got from Dr. Evelene CroonKaur - TSH  Over 30 minutes of exam, counseling, chart review and critical decision making was performed Future Appointments Date Time Provider Department Center  02/23/2017 10:00 AM Quentin Mullingollier, Malin Cervini, PA-C [id number] None     Subjective:  Alexis Weaver is a 66 y.o. female who presents for follow up  She complains of bilateral hand pain x 1 year, will wake her up, hand numbness, deep aching/drawing sensation, or with driving. No pain down her arms, + neck pain. No weakness in grip, no weakness. Had cervical Xray 2013 showing DDD C6-C7.  She has had negative ANA, AntiDNA, RF.    Her blood pressure has been controlled at home, today their BP is BP: 126/72 She does workout. She denies chest pain, shortness of breath, dizziness.  She is not on cholesterol medication and denies myalgias. Her cholesterol is at goal. The cholesterol last visit was:   Lab Results  Component Value Date   CHOL 206 (H) 08/24/2016   HDL 116 08/24/2016   LDLCALC 79 08/24/2016   TRIG 53 08/24/2016   CHOLHDL 1.8 08/24/2016   Last V3XA1C  Lab Results  Component Value Date   HGBA1C 5.5 08/24/2016   Lab Results  Component Value Date   GFRNONAA 85 08/24/2016   Patient is on Vitamin D supplement.   Lab Results  Component Value Date   VD25OH 72 08/24/2016     She is on estrogen therapy and on bASA.  She sees a therapist 3 x a week and Dr. Evelene CroonKaur. She states she is taking xanax just at  night for sleep. Sees Dr. Ezzard StandingNewman and Kingsford CallasSharma for allergies/ashtma.   Medication Review: Current Outpatient Prescriptions on File Prior to Visit  Medication Sig Dispense Refill  . ADVAIR DISKUS 250-50 MCG/DOSE AEPB inhale 1 puff INTO THE LUNGS twice a day if needed for shortness of breath 60 each 11  . almotriptan (AXERT) 12.5 MG tablet take 1 tablet by mouth if needed migraines may repeat if needed maximum daily dose of 2 12 tablet 2  . ALPRAZolam (XANAX) 0.25 MG tablet take 1 tablet by mouth twice a day if needed 60 tablet 3  . AMITIZA 24 MCG capsule take 1 capsule by mouth twice a day 180 capsule 1  . Biotin 1000 MCG tablet Take 1,000 mcg by mouth daily. Reported on 10/18/2015    . cycloSPORINE (RESTASIS) 0.05 % ophthalmic emulsion Place 1 drop into both eyes 2 (two) times daily.      Marland Kitchen. dextroamphetamine (DEXEDRINE SPANSULE) 10 MG 24 hr capsule Take 10 mg by mouth daily.   0  . docusate sodium (COLACE) 100 MG capsule Take 100 mg by mouth as needed for mild constipation.    . Esomeprazole Magnesium (NEXIUM 24HR) 20 MG TBEC Take 1 tablet by mouth 2 (two) times daily. 60 tablet 11  . estradiol (VIVELLE-DOT) 0.05 MG/24HR patch Place 1  patch (0.05 mg total) onto the skin 2 (two) times a week. 8 patch 12  . fluticasone (FLONASE) 50 MCG/ACT nasal spray instill 2 sprays into each nostril once daily 16 g 3  . LATISSE 0.03 % ophthalmic solution PLACE 1 DROP ON APPLICATOR AND APPLY EVENLY ALONG THE SKIN OF THE UPPER EYELID AT BASE OF EYELASHES ONCE DAILY AT BEDTIME REPEAT PROCEDURE F 3 mL 11  . levocetirizine (XYZAL) 5 MG tablet Take 1 tablet (5 mg total) by mouth every evening. 30 tablet 3  . loratadine (CLARITIN) 10 MG tablet Take 10 mg by mouth daily.    . magnesium gluconate (MAGONATE) 500 MG tablet Take 500 mg by mouth daily.    . montelukast (SINGULAIR) 10 MG tablet take 1 tablet by mouth once daily 90 tablet 1  . ondansetron (ZOFRAN) 8 MG tablet take 1 tablet by mouth every 8 hours if needed for  nausea and vomiting 100 tablet 3  . Probiotic Product (PROBIOTIC DAILY PO) Take 1 tablet by mouth daily.    . RELPAX 40 MG tablet take 1 tablet by mouth immediately if needed for HEADACHE, and may repeat ONCE after 2 hours  (MAXIMUM OF 2 TABLETS IN 24 HOURS) 6 tablet 1  . SAVELLA 100 MG TABS tablet take 1 to 2 tablets by mouth once daily as directed for MOOD and depression 60 tablet 6  . Sennosides (SENOKOT PO) Take 1 tablet by mouth daily.    . traZODone (DESYREL) 50 MG tablet Take 25 mg by mouth at bedtime.    . triamterene-hydrochlorothiazide (MAXZIDE) 75-50 MG tablet take 1 tablet by mouth once daily for high blood pressure and fluid retention 31 tablet PRN  . VITAMIN D, ERGOCALCIFEROL, PO Take by mouth. 5,000 IU daily     Current Facility-Administered Medications on File Prior to Visit  Medication Dose Route Frequency Provider Last Rate Last Dose  . 0.9 %  sodium chloride infusion  500 mL Intravenous Continuous Meryl Dare, MD        Allergies  Allergen Reactions  . Hydrocodone-Acetaminophen Shortness Of Breath  . Codeine Hives  . Morphine And Related Hives  . Nitrofurantoin Other (See Comments)    Makes hands and feet burn    Current Problems (verified) Patient Active Problem List   Diagnosis Date Noted  . Anemia 07/21/2015  . Prediabetes 06/26/2014  . Medication management 06/26/2014  . IBS (irritable bowel syndrome)-constipation type 06/26/2014  . Bilateral shoulder pain 06/26/2014  . Vitamin D deficiency 06/26/2014  . Asthma, chronic   . Chronic sinusitis   . Anxiety   . Major depression in partial remission (HCC)   . Osteopenia   . Palpitations 04/01/2010    SURGICAL HISTORY She  has a past surgical history that includes Abdominal hysterectomy; Cesarean section (1979, 1983); Breast enhancement surgery; Laparoscopic abdominal exploration; Hemorrhoid banding; and Appendectomy (1997). FAMILY HISTORY Her family history includes Asthma in her brother; COPD in her  brother; Diabetes in her brother and father; Diverticulitis in her maternal grandmother; Hyperlipidemia in her brother; Hypertension in her father and mother; Rheum arthritis in her sister. SOCIAL HISTORY She  reports that she has never smoked. She has never used smokeless tobacco. She reports that she does not drink alcohol or use drugs.   Review of Systems  Constitutional: Positive for malaise/fatigue. Negative for chills, diaphoresis, fever and weight loss.  HENT: Negative for congestion, ear discharge, ear pain, hearing loss, nosebleeds, sore throat and tinnitus.   Eyes: Negative.  Respiratory: Negative for cough, hemoptysis, sputum production, shortness of breath, wheezing and stridor.   Cardiovascular: Negative.   Gastrointestinal: Positive for constipation (on amitiza) and heartburn (better on nexium). Negative for abdominal pain, blood in stool, diarrhea, melena, nausea and vomiting.  Genitourinary: Positive for frequency. Negative for dysuria, flank pain, hematuria and urgency.  Musculoskeletal: Positive for myalgias and neck pain. Negative for back pain, falls and joint pain.  Skin: Negative.   Neurological: Negative.  Negative for weakness and headaches.  Psychiatric/Behavioral: Positive for depression. Negative for hallucinations, memory loss, substance abuse and suicidal ideas. The patient is not nervous/anxious.      Objective:     Today's Vitals   12/04/16 0918  BP: 126/72  Pulse: 87  Resp: 14  Temp: 97.5 F (36.4 C)  SpO2: 96%  Weight: 104 lb 9.6 oz (47.4 kg)  Height: 5' 1.5" (1.562 m)  PainSc: 5   PainLoc: Hand   Body mass index is 19.44 kg/m.  General appearance: alert, no distress, WD/WN, female HEENT: normocephalic, sclerae anicteric, TMs pearly, nares patent, no discharge or erythema, pharynx normal Oral cavity: MMM, no lesions Neck: supple, no lymphadenopathy, no thyromegaly, no masses Heart: RRR, normal S1, S2, no murmurs Lungs: CTA bilaterally, no  wheezes, rhonchi, or rales Abdomen: +bs, soft, non tender, non distended, no masses, no hepatomegaly, no splenomegaly Musculoskeletal: nontender, no swelling, no obvious deformity, negative tinel's/phalen's, + impingement test neck, good distal neurovascular and grip Extremities: no edema, no cyanosis, no clubbing Pulses: 2+ symmetric, upper and lower extremities, normal cap refill Neurological: alert, oriented x 3, CN2-12 intact, strength normal upper extremities and lower extremities, sensation normal throughout, DTRs 2+ throughout, no cerebellar signs, gait normal Psychiatric: normal affect, behavior normal, pleasant    Quentin Mulling, PA-C   12/04/2016

## 2016-12-04 ENCOUNTER — Encounter: Payer: Self-pay | Admitting: Physician Assistant

## 2016-12-04 ENCOUNTER — Ambulatory Visit: Payer: Self-pay | Admitting: Physician Assistant

## 2016-12-04 ENCOUNTER — Ambulatory Visit (INDEPENDENT_AMBULATORY_CARE_PROVIDER_SITE_OTHER): Payer: Medicare Other | Admitting: Physician Assistant

## 2016-12-04 VITALS — BP 126/72 | HR 87 | Temp 97.5°F | Resp 14 | Ht 61.5 in | Wt 104.6 lb

## 2016-12-04 DIAGNOSIS — E559 Vitamin D deficiency, unspecified: Secondary | ICD-10-CM

## 2016-12-04 DIAGNOSIS — M4692 Unspecified inflammatory spondylopathy, cervical region: Secondary | ICD-10-CM

## 2016-12-04 DIAGNOSIS — Z79899 Other long term (current) drug therapy: Secondary | ICD-10-CM | POA: Diagnosis not present

## 2016-12-04 DIAGNOSIS — G8929 Other chronic pain: Secondary | ICD-10-CM | POA: Diagnosis not present

## 2016-12-04 DIAGNOSIS — F3341 Major depressive disorder, recurrent, in partial remission: Secondary | ICD-10-CM | POA: Diagnosis not present

## 2016-12-04 DIAGNOSIS — E785 Hyperlipidemia, unspecified: Secondary | ICD-10-CM

## 2016-12-04 DIAGNOSIS — R7303 Prediabetes: Secondary | ICD-10-CM

## 2016-12-04 DIAGNOSIS — M47812 Spondylosis without myelopathy or radiculopathy, cervical region: Secondary | ICD-10-CM

## 2016-12-04 DIAGNOSIS — M79643 Pain in unspecified hand: Secondary | ICD-10-CM

## 2016-12-04 LAB — CBC WITH DIFFERENTIAL/PLATELET
BASOS ABS: 49 {cells}/uL (ref 0–200)
BASOS PCT: 1 %
Eosinophils Absolute: 147 cells/uL (ref 15–500)
Eosinophils Relative: 3 %
HCT: 34.5 % — ABNORMAL LOW (ref 35.0–45.0)
HEMOGLOBIN: 11.1 g/dL — AB (ref 11.7–15.5)
LYMPHS ABS: 1421 {cells}/uL (ref 850–3900)
Lymphocytes Relative: 29 %
MCH: 29 pg (ref 27.0–33.0)
MCHC: 32.2 g/dL (ref 32.0–36.0)
MCV: 90.1 fL (ref 80.0–100.0)
MONOS PCT: 8 %
MPV: 9.6 fL (ref 7.5–12.5)
Monocytes Absolute: 392 cells/uL (ref 200–950)
NEUTROS ABS: 2891 {cells}/uL (ref 1500–7800)
Neutrophils Relative %: 59 %
PLATELETS: 268 10*3/uL (ref 140–400)
RBC: 3.83 MIL/uL (ref 3.80–5.10)
RDW: 15.5 % — ABNORMAL HIGH (ref 11.0–15.0)
WBC: 4.9 10*3/uL (ref 3.8–10.8)

## 2016-12-04 LAB — BASIC METABOLIC PANEL WITH GFR
BUN: 18 mg/dL (ref 7–25)
CHLORIDE: 103 mmol/L (ref 98–110)
CO2: 28 mmol/L (ref 20–31)
CREATININE: 0.74 mg/dL (ref 0.50–0.99)
Calcium: 9 mg/dL (ref 8.6–10.4)
GFR, Est African American: >89 mL/min (ref >=60)
GFR, Est Non African American: 85 mL/min (ref >=60)
Glucose, Bld: 50 mg/dL — ABNORMAL LOW (ref 65–99)
POTASSIUM: 3.6 mmol/L (ref 3.5–5.3)
SODIUM: 140 mmol/L (ref 135–146)

## 2016-12-04 LAB — HEPATIC FUNCTION PANEL
ALK PHOS: 49 U/L (ref 33–130)
ALT: 20 U/L (ref 6–29)
AST: 22 U/L (ref 10–35)
Albumin: 3.9 g/dL (ref 3.6–5.1)
BILIRUBIN DIRECT: 0.1 mg/dL (ref <=0.2)
BILIRUBIN INDIRECT: 0.4 mg/dL (ref 0.2–1.2)
BILIRUBIN TOTAL: 0.5 mg/dL (ref 0.2–1.2)
Total Protein: 5.8 g/dL — ABNORMAL LOW (ref 6.1–8.1)

## 2016-12-04 LAB — LIPID PANEL
CHOL/HDL RATIO: 1.8 ratio (ref <5.0)
Cholesterol: 175 mg/dL (ref <200)
HDL: 97 mg/dL (ref >50)
LDL CALC: 70 mg/dL (ref <100)
Triglycerides: 39 mg/dL (ref <150)
VLDL: 8 mg/dL (ref <30)

## 2016-12-04 NOTE — Patient Instructions (Signed)
Trigger Point Dry Needling   What is Trigger Point Dry Needling (DN)?   1. DN is a physical therapy technique used to treat muscle pain and     Dysfunction.  Specifically, DN helps deactivate muscle trigger    points (Muscle Knots).   2. A thin filiform needle is used to penetrate the skin and stimulate    the underlying trigger point.  The goal is for a local twitch     response (LTR) to occur and for the trigger point to relax.  No    medication of any kind is injected during the procedure.   What Does Trigger Point Dry Needling Feel Like?   1. The procedures feels different for each individual patient.   Some    patients report that they do not actually feel the      needle enter the skin and overall the process is not  painful.  Very    mild bleeding may occur.  However, many patients feel a deep    cramping in the muscle in which the needle was inserted. This is t   he local twitch response.    How Will I Feel After The Treatment?   1. Soreness is normal, and the onset of soreness may not occur for    a few hours.  Typically this soreness does not last longer than two    days.   2. Bruising is uncommon, however; ice can be used to decrease any    possible bruising.   3. In rare cases feeling tired or nauseous after the treatment is    normal.  In addition, your symptoms may get worse before they    get better, this period will typically not last longer than 24 hours.   What Can I do After My Treatment?   1.  Increase your hydration by drinking more water for the next 24    hours.   2.  You may place ice or heat on the areas treated that have become    sore, however don not use heat on inflamed or bruised areas.     Heat often brings more relief post needling.   3. You can continue your regular activities, but vigorous activity is    not recommended initially after the treatment for 24 hours.   4. DN is best combined with other physical therapy such as     strengthening, stretching, and  other therapies.    Cervical Radiculopathy Cervical radiculopathy happens when a nerve in the neck (cervical nerve) is pinched or bruised. This condition can develop because of an injury or as part of the normal aging process. Pressure on the cervical nerves can cause pain or numbness that runs from the neck all the way down into the arm and fingers. Usually, this condition gets better with rest. Treatment may be needed if the condition does not improve. What are the causes? This condition may be caused by:  Injury.  Slipped (herniated) disk.  Muscle tightness in the neck because of overuse.  Arthritis.  Breakdown or degeneration in the bones and joints of the spine (spondylosis) due to aging.  Bone spurs that may develop near the cervical nerves.  What are the signs or symptoms? Symptoms of this condition include:  Pain that runs from the neck to the arm and hand. The pain can be severe or irritating. It may be worse when the neck is moved.  Numbness or weakness in the affected arm and  hand.  How is this diagnosed? This condition may be diagnosed based on symptoms, medical history, and a physical exam. You may also have tests, including:  X-rays.  CT scan.  MRI.  Electromyogram (EMG).  Nerve conduction tests.  How is this treated? In many cases, treatment is not needed for this condition. With rest, the condition usually gets better over time. If treatment is needed, options may include:  Wearing a soft neck collar for short periods of time.  Physical therapy to strengthen your neck muscles.  Medicines, such as NSAIDs, oral corticosteroids, or spinal injections.  Surgery. This may be needed if other treatments do not help. Various types of surgery may be done depending on the cause of your problems.  Follow these instructions at home: Managing pain  Take over-the-counter and prescription medicines only as told by your health care provider.  If directed, apply  ice to the affected area. ? Put ice in a plastic bag. ? Place a towel between your skin and the bag. ? Leave the ice on for 20 minutes, 2-3 times per day.  If ice does not help, you can try using heat. Take a warm shower or warm bath, or use a heat pack as told by your health care provider.  Try a gentle neck and shoulder massage to help relieve symptoms. Activity  Rest as needed. Follow instructions from your health care provider about any restrictions on activities.  Do stretching and strengthening exercises as told by your health care provider or physical therapist. General instructions  If you were given a soft collar, wear it as told by your health care provider.  Use a flat pillow when you sleep.  Keep all follow-up visits as told by your health care provider. This is important. Contact a health care provider if:  Your condition does not improve with treatment. Get help right away if:  Your pain gets much worse and cannot be controlled with medicines.  You have weakness or numbness in your hand, arm, face, or leg.  You have a high fever.  You have a stiff, rigid neck.  You lose control of your bowels or your bladder (have incontinence).  You have trouble with walking, balance, or speaking. This information is not intended to replace advice given to you by your health care provider. Make sure you discuss any questions you have with your health care provider. Document Released: 02/24/2001 Document Revised: 11/07/2015 Document Reviewed: 07/26/2014 [employer] Interactive Patient Education  [employer]2018 [employer]

## 2016-12-05 LAB — MAGNESIUM: MAGNESIUM: 1.9 mg/dL (ref 1.5–2.5)

## 2016-12-05 LAB — TSH: TSH: 1.83 mIU/L

## 2016-12-26 ENCOUNTER — Other Ambulatory Visit: Payer: Self-pay | Admitting: Internal Medicine

## 2017-01-04 ENCOUNTER — Other Ambulatory Visit: Payer: Self-pay | Admitting: Internal Medicine

## 2017-02-21 NOTE — Progress Notes (Signed)
CPE  Assessment:   Prediabetes Discussed general issues about diabetes pathophysiology and management., Educational material distributed., Suggested low cholesterol diet., Encouraged aerobic exercise., Discussed foot care., Reminded to get yearly retinal exam.  Osteopenia Up to date, add on weight bearing exercises, due next year  Chronic maxillary sinusitis monitor   Vitamin D deficiency - Vit D  25 hydroxy (rtn osteoporosis monitoring)  Encounter for long-term (current) use of medications - CBC with Differential/Platelet - BASIC METABOLIC PANEL WITH GFR - Hepatic function panel - Magnesium  Major Depression in partial remission Follow up Dr. Lyanne Co, no SI/HI - TSH   Anxiety Follow up Dr. Lyanne Co   IBS (irritable bowel syndrome)-constipation type Continue amitza  Asthma, chronic, unspecified asthma severity, uncomplicated No signs of infection at this time, will check CBC, get back on advair and allergy   Anemia, unspecified anemia type Check CBC  Palpitations -     TSH -     EKG 12-Lead  Bilateral shoulder pain, unspecified chronicity Did not like breakthrough, will refer to new PT for eval/dry needling  Screening cholesterol level -     Lipid panel  Screening for hematuria or proteinuria -     Urinalysis w microscopic + reflex cultur -     Microalbumin / creatinine urine ratio  Screening for viral disease -     HIV antibody -     Hepatitis C antibody   Over 40 minutes of exam, counseling, chart review and critical decision making was performed Future Appointments Date Time Provider Department Center  03/01/2018 10:00 AM Quentin Mulling, PA-C [facility] None      Subjective:  Alexis Weaver is a 66 y.o. female who presents for CPE and follow up   Her blood pressure has been controlled at home, today their BP is BP: 124/68 She does workout. She denies chest pain, shortness of breath, dizziness.  She complains of bilateral hand pain x 1 year, will  wake her up, hand numbness, deep aching/drawing sensation, or with driving. No pain down her arms, + neck pain. No weakness in grip, no weakness. Had cervical Xray 2013 showing DDD C6-C7.  She is not on cholesterol medication and denies myalgias. Her cholesterol is at goal. The cholesterol last visit was:   Lab Results  Component Value Date   CHOL 175 12/04/2016   HDL 97 12/04/2016   LDLCALC 70 12/04/2016   TRIG 39 12/04/2016   CHOLHDL 1.8 12/04/2016   Last Z6X  Lab Results  Component Value Date   HGBA1C 5.5 08/24/2016   Lab Results  Component Value Date   GFRNONAA 85 12/04/2016   Patient is on Vitamin D supplement.   Lab Results  Component Value Date   VD25OH 72 08/24/2016     She has had negative ANA, AntiDNA, RF.  She is on estrogen therapy and on bASA.  She sees a therapist and Dr. Evelene Croon. She states she is taking xanax just at night for sleep, she is taking her brother to court to have him deemed incompentant in April however this has increased stress on her, she is interested in switching he xanax to a different sleep med but is afraid to do it during this time.  Sees Dr. Ezzard Standing and [place] [facility] for allergies/asthma, allergies have been worse this past month, lots of left sided sinus pressure. Has tried allergy shots x 2 without help. No fever chills.   Medication Review: Current Outpatient Prescriptions on File Prior to Visit  Medication Sig Dispense Refill  .  ADVAIR DISKUS 250-50 MCG/DOSE AEPB inhale 1 puff by mouth twice a day if needed for shortness of breath 60 each 11  . almotriptan (AXERT) 12.5 MG tablet take 1 tablet by mouth if needed migraines may repeat if needed maximum daily dose of 2 12 tablet 2  . ALPRAZolam (XANAX) 0.25 MG tablet take 1 tablet by mouth twice a day if needed 60 tablet 3  . AMITIZA 24 MCG capsule take 1 capsule by mouth twice a day 180 capsule 1  . Biotin 1000 MCG tablet Take 1,000 mcg by mouth daily. Reported on 10/18/2015    . cycloSPORINE (RESTASIS)  0.05 % ophthalmic emulsion Place 1 drop into both eyes 2 (two) times daily.      Marland Kitchen dextroamphetamine (DEXEDRINE SPANSULE) 10 MG 24 hr capsule Take 10 mg by mouth daily.   0  . docusate sodium (COLACE) 100 MG capsule Take 100 mg by mouth as needed for mild constipation.    . Esomeprazole Magnesium (NEXIUM 24HR) 20 MG TBEC Take 1 tablet by mouth 2 (two) times daily. 60 tablet 11  . estradiol (VIVELLE-DOT) 0.05 MG/24HR patch Place 1 patch (0.05 mg total) onto the skin 2 (two) times a week. 8 patch 12  . fluticasone (FLONASE) 50 MCG/ACT nasal spray instill 2 sprays into each nostril once daily 16 g 3  . LATISSE 0.03 % ophthalmic solution PLACE 1 DROP ON APPLICATOR AND APPLY EVENLY ALONG THE SKIN OF THE UPPER EYELID AT BASE OF EYELASHES ONCE DAILY AT BEDTIME REPEAT PROCEDURE F 3 mL 11  . levocetirizine (XYZAL) 5 MG tablet Take 1 tablet (5 mg total) by mouth every evening. 30 tablet 3  . loratadine (CLARITIN) 10 MG tablet Take 10 mg by mouth daily.    . magnesium gluconate (MAGONATE) 500 MG tablet Take 500 mg by mouth daily.    . montelukast (SINGULAIR) 10 MG tablet take 1 tablet by mouth once daily 90 tablet 1  . ondansetron (ZOFRAN) 8 MG tablet take 1 tablet by mouth every 8 hours if needed for nausea and vomiting 100 tablet 3  . Probiotic Product (PROBIOTIC DAILY PO) Take 1 tablet by mouth daily.    . RELPAX 40 MG tablet take 1 tablet by mouth immediately if needed for HEADACHE, and may repeat ONCE after 2 hours  (MAXIMUM OF 2 TABLETS IN 24 HOURS) 6 tablet 1  . SAVELLA 100 MG TABS tablet take 1 to 2 tablets by mouth once daily as directed for MOOD and depression 60 tablet 6  . Sennosides (SENOKOT PO) Take 1 tablet by mouth daily.    . traZODone (DESYREL) 50 MG tablet Take 25 mg by mouth at bedtime.    . triamterene-hydrochlorothiazide (MAXZIDE) 75-50 MG tablet take 1 tablet by mouth once daily for high blood pressure and fluid retention 31 tablet PRN  . VITAMIN D, ERGOCALCIFEROL, PO Take by mouth.  5,000 IU daily     Current Facility-Administered Medications on File Prior to Visit  Medication Dose Route Frequency Provider Last Rate Last Dose  . 0.9 %  sodium chloride infusion  500 mL Intravenous Continuous Meryl Dare, MD        Allergies  Allergen Reactions  . Hydrocodone-Acetaminophen Shortness Of Breath  . Codeine Hives  . Morphine And Related Hives  . Nitrofurantoin Other (See Comments)    Makes hands and feet burn    Current Problems (verified) Patient Active Problem List   Diagnosis Date Noted  . Anemia 07/21/2015  . Prediabetes 06/26/2014  .  Medication management 06/26/2014  . IBS (irritable bowel syndrome)-constipation type 06/26/2014  . Bilateral shoulder pain 06/26/2014  . Vitamin D deficiency 06/26/2014  . Asthma, chronic   . Chronic sinusitis   . Anxiety   . Major depression in partial remission (HCC)   . Osteopenia   . Palpitations 04/01/2010    Screening Tests Immunization History  Administered Date(s) Administered  . DT 11/06/2004  . Influenza-Unspecified 02/14/2012  . Tdap 02/26/2011   Tetanus: 2012 Pneumovax: N/A due AFTER prevnar  Prevnar 13: out of in the office Flu vaccine: declines Zostavax: N/A Pap: 2016, declines another,never abnormal pap MGM: 12/2016 [employer] DEXA: 12/2015 due next year -2.3 right femur Colonoscopy: 2017  Dr. Russella DarStark EGD: 10/2015 MRI brain 2010 CXR 2015  Names of Other Physician/Practitioners you currently use: 1. [facility] here for primary care 2. [place], eye doctor, last visit 2018 3. [place], dentist, last visit 2018 Patient Care Team: Lucky CowboyMcKeown, William, MD as PCP - General (Internal Medicine) Sidney AceSharma, Ranjan, MD as Referring Physician (Allergy) Marcene Corningalldorf, Peter, MD as Consulting Physician (Orthopedic Surgery)  SURGICAL HISTORY She  has a past surgical history that includes Abdominal hysterectomy; Cesarean section (1979, 1983); Breast enhancement surgery; Laparoscopic  abdominal exploration; Hemorrhoid banding; and Appendectomy (1997). FAMILY HISTORY Her family history includes Asthma in her brother; COPD in her brother; Diabetes in her brother and father; Diverticulitis in her maternal grandmother; Hyperlipidemia in her brother; Hypertension in her father and mother; Rheum arthritis in her sister. SOCIAL HISTORY She  reports that she has never smoked. She has never used smokeless tobacco. She reports that she does not drink alcohol or use drugs.   Review of Systems  Constitutional: Positive for malaise/fatigue. Negative for chills, diaphoresis, fever and weight loss.  HENT: Negative for congestion, ear discharge, ear pain, hearing loss, nosebleeds, sore throat and tinnitus.   Eyes: Negative.   Respiratory: Negative for cough, hemoptysis, sputum production, shortness of breath, wheezing and stridor.   Cardiovascular: Negative.   Gastrointestinal: Positive for constipation (on amitiza) and heartburn (better on nexium). Negative for abdominal pain, blood in stool, diarrhea, melena, nausea and vomiting.  Genitourinary: Positive for frequency. Negative for dysuria, flank pain, hematuria and urgency.  Musculoskeletal: Positive for myalgias. Negative for back pain, falls, joint pain and neck pain.  Skin: Negative.   Neurological: Negative.  Negative for weakness and headaches.  Psychiatric/Behavioral: Positive for depression. Negative for hallucinations, memory loss, substance abuse and suicidal ideas. The patient is not nervous/anxious.      Objective:     Today's Vitals   02/23/17 1012  BP: 124/68  Pulse: 74  Resp: 16  Temp: (!) 97.5 F (36.4 C)  SpO2: 96%  Weight: 106 lb (48.1 kg)  Height: 5' 1.5" (1.562 m)   Body mass index is 19.7 kg/m.  General appearance: alert, no distress, WD/WN, female HEENT: normocephalic, sclerae anicteric, TMs pearly, nares patent, no discharge or erythema, pharynx normal Oral cavity: MMM, no lesions Neck: supple, no  lymphadenopathy, no thyromegaly, no masses Heart: RRR, normal S1, S2, no murmurs Lungs: CTA bilaterally, no wheezes, rhonchi, or rales Abdomen: +bs, soft, non tender, non distended, no masses, no hepatomegaly, no splenomegaly Musculoskeletal: nontender, no swelling, no obvious deformity, negative tinel's/phalen's, + impingement test neck, good distal neurovascular and grip Extremities: no edema, no cyanosis, no clubbing Pulses: 2+ symmetric, upper and lower extremities, normal cap refill Neurological: alert, oriented x 3, CN2-12 intact, strength normal upper extremities and lower extremities, sensation normal throughout, DTRs 2+  throughout, no cerebellar signs, gait normal Psychiatric: normal affect, behavior normal, pleasant     Quentin Mulling, PA-C   02/23/2017

## 2017-02-23 ENCOUNTER — Encounter: Payer: Self-pay | Admitting: Physician Assistant

## 2017-02-23 ENCOUNTER — Ambulatory Visit (INDEPENDENT_AMBULATORY_CARE_PROVIDER_SITE_OTHER): Payer: Medicare Other | Admitting: Physician Assistant

## 2017-02-23 VITALS — BP 124/68 | HR 74 | Temp 97.5°F | Resp 16 | Ht 61.5 in | Wt 106.0 lb

## 2017-02-23 DIAGNOSIS — J320 Chronic maxillary sinusitis: Secondary | ICD-10-CM

## 2017-02-23 DIAGNOSIS — M25512 Pain in left shoulder: Secondary | ICD-10-CM

## 2017-02-23 DIAGNOSIS — R002 Palpitations: Secondary | ICD-10-CM

## 2017-02-23 DIAGNOSIS — E559 Vitamin D deficiency, unspecified: Secondary | ICD-10-CM

## 2017-02-23 DIAGNOSIS — Z79899 Other long term (current) drug therapy: Secondary | ICD-10-CM

## 2017-02-23 DIAGNOSIS — M8580 Other specified disorders of bone density and structure, unspecified site: Secondary | ICD-10-CM

## 2017-02-23 DIAGNOSIS — M25511 Pain in right shoulder: Secondary | ICD-10-CM

## 2017-02-23 DIAGNOSIS — Z0001 Encounter for general adult medical examination with abnormal findings: Secondary | ICD-10-CM

## 2017-02-23 DIAGNOSIS — Z13220 Encounter for screening for lipoid disorders: Secondary | ICD-10-CM

## 2017-02-23 DIAGNOSIS — Z136 Encounter for screening for cardiovascular disorders: Secondary | ICD-10-CM

## 2017-02-23 DIAGNOSIS — Z0000 Encounter for general adult medical examination without abnormal findings: Secondary | ICD-10-CM | POA: Diagnosis not present

## 2017-02-23 DIAGNOSIS — Z1389 Encounter for screening for other disorder: Secondary | ICD-10-CM

## 2017-02-23 DIAGNOSIS — J45909 Unspecified asthma, uncomplicated: Secondary | ICD-10-CM

## 2017-02-23 DIAGNOSIS — Z1159 Encounter for screening for other viral diseases: Secondary | ICD-10-CM

## 2017-02-23 DIAGNOSIS — R7303 Prediabetes: Secondary | ICD-10-CM

## 2017-02-23 DIAGNOSIS — F3341 Major depressive disorder, recurrent, in partial remission: Secondary | ICD-10-CM

## 2017-02-23 DIAGNOSIS — D649 Anemia, unspecified: Secondary | ICD-10-CM

## 2017-02-23 DIAGNOSIS — K589 Irritable bowel syndrome without diarrhea: Secondary | ICD-10-CM

## 2017-02-23 DIAGNOSIS — F419 Anxiety disorder, unspecified: Secondary | ICD-10-CM

## 2017-02-23 NOTE — Patient Instructions (Signed)
Osteoporosis Osteoporosis is the thinning and loss of density in the bones. Osteoporosis makes the bones more brittle, fragile, and likely to break (fracture). Over time, osteoporosis can cause the bones to become so weak that they fracture after a simple fall. The bones most likely to fracture are the bones in the hip, wrist, and spine. What are the causes? The exact cause is not known. What increases the risk? Anyone can develop osteoporosis. You may be at greater risk if you have a family history of the condition or have poor nutrition. You may also have a higher risk if you are:  Female.  4 years old or older.  A smoker.  Not physically active.  White or Asian.  Slender.  What are the signs or symptoms? A fracture might be the first sign of the disease, especially if it results from a fall or injury that would not usually cause a bone to break. Other signs and symptoms include:  Low back and neck pain.  Stooped posture.  Height loss.  How is this diagnosed? To make a diagnosis, your health care provider may:  Take a medical history.  Perform a physical exam.  Order tests, such as: ? A bone mineral density test. ? A dual-energy X-ray absorptiometry test.  How is this treated? The goal of osteoporosis treatment is to strengthen your bones to reduce your risk of a fracture. Treatment may involve:  Making lifestyle changes, such as: ? Eating a diet rich in calcium. ? Doing weight-bearing and muscle-strengthening exercises. ? Stopping tobacco use. ? Limiting alcohol intake.  Taking medicine to slow the process of bone loss or to increase bone density.  Monitoring your levels of calcium and vitamin D.  Follow these instructions at home:  Include calcium and vitamin D in your diet. Calcium is important for bone health, and vitamin D helps the body absorb calcium.  Perform weight-bearing and muscle-strengthening exercises as directed by your health care  provider.  Do not use any tobacco products, including cigarettes, chewing tobacco, and electronic cigarettes. If you need help quitting, ask your health care provider.  Limit your alcohol intake.  Take medicines only as directed by your health care provider.  Keep all follow-up visits as directed by your health care provider. This is important.  Take precautions at home to lower your risk of falling, such as: ? Keeping rooms well lit and clutter free. ? Installing safety rails on stairs. ? Using rubber mats in the bathroom and other areas that are often wet or slippery. Get help right away if: You fall or injure yourself. This information is not intended to replace advice given to you by your health care provider. Make sure you discuss any questions you have with your health care provider. Document Released: 03/11/2005 Document Revised: 11/04/2015 Document Reviewed: 11/09/2013 Elsevier Interactive Patient Education  2017 [employer].   Here is some information to help you keep your heart healthy: Move it! - Aim for 30 mins of activity every day. Take it slowly at first. Talk to [place] before starting any new exercise program.   Lose it.  -Body Mass Index (BMI) can indicate if you need to lose weight. A healthy range is 18.5-24.9. For a BMI calculator, go to [employer]  Waist Management -Excess abdominal fat is a risk factor for heart disease, diabetes, asthma, stroke and more. Ideal waist circumference is less than 35" for women and less than 40" for men.   Eat Right -focus on fruits, vegetables, whole  grains, and meals you make yourself. Avoid foods with trans fat and high sugar/sodium content.   Snooze or Snore? - Loud snoring can be a sign of sleep apnea, a significant risk factor for high blood pressure, heart attach, stroke, and heart arrhythmias.  Kick the habit -Quit Smoking! Avoid second hand smoke. A single cigarette raises your blood pressure for 20 mins and  increases the risk of heart attack and stroke for the next 24 hours.   Are Aspirin and Supplements right for you? -Add ENTERIC COATED low dose 81 mg Aspirin daily OR can do every other day if you have easy bruising to protect your heart and head. As well as to reduce risk of Colon Cancer by 20 %, Skin Cancer by 26 % , Melanoma by 46% and Pancreatic cancer by 60%  Say "No to Stress -There may be little you can do about problems that cause stress. However, techniques such as long walks, meditation, and exercise can help you manage it.   Start Now! - Make changes one at a time and set reasonable goals to increase your likelihood of success.

## 2017-02-24 LAB — CBC WITH DIFFERENTIAL/PLATELET
Basophils Absolute: 70 cells/uL (ref 0–200)
Basophils Relative: 1.6 %
EOS ABS: 48 {cells}/uL (ref 15–500)
Eosinophils Relative: 1.1 %
HEMATOCRIT: 36.6 % (ref 35.0–45.0)
Hemoglobin: 12.1 g/dL (ref 11.7–15.5)
LYMPHS ABS: 1712 {cells}/uL (ref 850–3900)
MCH: 29 pg (ref 27.0–33.0)
MCHC: 33.1 g/dL (ref 32.0–36.0)
MCV: 87.8 fL (ref 80.0–100.0)
MPV: 10.7 fL (ref 7.5–12.5)
Monocytes Relative: 7.7 %
NEUTROS PCT: 50.7 %
Neutro Abs: 2231 cells/uL (ref 1500–7800)
PLATELETS: 287 10*3/uL (ref 140–400)
RBC: 4.17 10*6/uL (ref 3.80–5.10)
RDW: 13.9 % (ref 11.0–15.0)
TOTAL LYMPHOCYTE: 38.9 %
WBC: 4.4 10*3/uL (ref 3.8–10.8)
WBCMIX: 339 {cells}/uL (ref 200–950)

## 2017-02-24 LAB — URINALYSIS W MICROSCOPIC + REFLEX CULTURE
BACTERIA UA: NONE SEEN /HPF
Bilirubin Urine: NEGATIVE
Glucose, UA: NEGATIVE
HGB URINE DIPSTICK: NEGATIVE
HYALINE CAST: NONE SEEN /LPF
Ketones, ur: NEGATIVE
Leukocyte Esterase: NEGATIVE
Nitrites, Initial: NEGATIVE
PH: 7.5 (ref 5.0–8.0)
Protein, ur: NEGATIVE
RBC / HPF: NONE SEEN /HPF (ref 0–2)
Specific Gravity, Urine: 1.011 (ref 1.001–1.03)
Squamous Epithelial / LPF: NONE SEEN /HPF (ref <=5)
WBC UA: NONE SEEN /HPF (ref 0–5)

## 2017-02-24 LAB — HEPATIC FUNCTION PANEL
AG Ratio: 1.9 (calc) (ref 1.0–2.5)
ALBUMIN MSPROF: 4.4 g/dL (ref 3.6–5.1)
ALT: 24 U/L (ref 6–29)
AST: 25 U/L (ref 10–35)
Alkaline phosphatase (APISO): 55 U/L (ref 33–130)
BILIRUBIN DIRECT: 0.1 mg/dL (ref 0.0–0.2)
BILIRUBIN TOTAL: 0.5 mg/dL (ref 0.2–1.2)
GLOBULIN: 2.3 g/dL (ref 1.9–3.7)
Indirect Bilirubin: 0.4 mg/dL (calc) (ref 0.2–1.2)
Total Protein: 6.7 g/dL (ref 6.1–8.1)

## 2017-02-24 LAB — MICROALBUMIN / CREATININE URINE RATIO: CREATININE, URINE: 19 mg/dL — AB (ref 20–275)

## 2017-02-24 LAB — IRON, TOTAL/TOTAL IRON BINDING CAP
%SAT: 12 % (ref 11–50)
Iron: 53 ug/dL (ref 45–160)
TIBC: 429 mcg/dL (calc) (ref 250–450)

## 2017-02-24 LAB — BASIC METABOLIC PANEL WITH GFR
BUN: 18 mg/dL (ref 7–25)
CALCIUM: 9.5 mg/dL (ref 8.6–10.4)
CHLORIDE: 99 mmol/L (ref 98–110)
CO2: 29 mmol/L (ref 20–32)
Creat: 0.71 mg/dL (ref 0.50–0.99)
GFR, EST AFRICAN AMERICAN: 104 mL/min/{1.73_m2} (ref >=60)
GFR, EST NON AFRICAN AMERICAN: 89 mL/min/{1.73_m2} (ref >=60)
Glucose, Bld: 81 mg/dL (ref 65–99)
Potassium: 3.9 mmol/L (ref 3.5–5.3)
Sodium: 137 mmol/L (ref 135–146)

## 2017-02-24 LAB — VITAMIN D 25 HYDROXY (VIT D DEFICIENCY, FRACTURES): VIT D 25 HYDROXY: 85 ng/mL (ref 30–100)

## 2017-02-24 LAB — NO CULTURE INDICATED

## 2017-02-24 LAB — LIPID PANEL
CHOL/HDL RATIO: 1.8 (calc) (ref <5.0)
CHOLESTEROL: 199 mg/dL (ref <200)
HDL: 111 mg/dL (ref >50)
LDL CHOLESTEROL (CALC): 77 mg/dL
Non-HDL Cholesterol (Calc): 88 mg/dL (calc) (ref <130)
Triglycerides: 39 mg/dL (ref <150)

## 2017-02-24 LAB — HIV ANTIBODY (ROUTINE TESTING W REFLEX): HIV: NONREACTIVE

## 2017-02-24 LAB — VITAMIN B12: VITAMIN B 12: 494 pg/mL (ref 200–1100)

## 2017-02-24 LAB — MAGNESIUM: Magnesium: 1.8 mg/dL (ref 1.5–2.5)

## 2017-02-24 LAB — TSH: TSH: 1.82 mIU/L (ref 0.40–4.50)

## 2017-02-24 LAB — HEPATITIS C ANTIBODY
Hepatitis C Ab: NONREACTIVE
SIGNAL TO CUT-OFF: 0 (ref <1.00)

## 2017-03-20 ENCOUNTER — Other Ambulatory Visit: Payer: Self-pay | Admitting: Internal Medicine

## 2017-03-23 ENCOUNTER — Encounter: Payer: Self-pay | Admitting: Adult Health

## 2017-03-23 ENCOUNTER — Ambulatory Visit (INDEPENDENT_AMBULATORY_CARE_PROVIDER_SITE_OTHER): Payer: Medicare Other | Admitting: Adult Health

## 2017-03-23 VITALS — BP 110/68 | HR 62 | Temp 97.5°F | Resp 16 | Ht 61.5 in | Wt 105.2 lb

## 2017-03-23 DIAGNOSIS — R300 Dysuria: Secondary | ICD-10-CM | POA: Diagnosis not present

## 2017-03-23 MED ORDER — SULFAMETHOXAZOLE-TRIMETHOPRIM 800-160 MG PO TABS: 1.0000 | ORAL_TABLET | Freq: Two times a day (BID) | ORAL | 0 refills | Status: DC

## 2017-03-23 NOTE — Progress Notes (Signed)
Assessment and Plan:  Alexis Weaver was seen today for urinary frequency and acute visit.  Diagnoses and all orders for this visit:  Dysuria -     Urinalysis w microscopic + reflex cultur -     sulfamethoxazole-trimethoprim (BACTRIM DS,SEPTRA DS) 800-160 MG tablet; Take 1 tablet by mouth 2 (two) times daily.  Presumptive treatment for cystitis ongoing and worse in past 48 hours. Continue to push fluids, may continue with OTC phenazopyridium for 24 hours then stop to evaluate efficacy of antibiotic and improvement of symptoms. Instructed to call office or go to ER with frank blood in urine/fever/chills.   Further disposition pending results of labs. Discussed med's effects and SE's.   Over 15 minutes of exam, counseling, chart review, and critical decision making was performed.   Future Appointments Date Time Provider Department Center  03/01/2018 10:00 AM Quentin Mulling, PA-C [facility] None    ------------------------------------------------------------------------------------------------------------------   HPI 66 y.o.female presents for gradually worsening symptoms of ongoing urgency, frequency, dysuria x 2 weeks that has significantly worsened over the past 48 hrs and new onset back pain. Endorses chills and fatigue, suprapubic pressure. Denies fever, diaphoresis, blood in urine, new rashes, vaginal discharge, foul odor to urine. Has been pushing fluids and taking Azo which was helping until 48 hours ago.   Reports distant history of UTIs, non recently in past few years, denies previous pyelonephritis, renal calculi. Married, no new sexual partners, distant personal history for last sexual intercourse.   Past Medical History:  Diagnosis Date  . Allergy   . Anemia 2016  . Anxiety   . Asthma   . Chronic headaches   . Chronic sinusitis   . Depression   . Fibromyalgia   . GERD (gastroesophageal reflux disease)   . IBS (irritable bowel syndrome)   . Meckel diverticulum 1997  .  Meckel's diverticulum   . Neuromuscular disorder (HCC)    fibromyagia  . Osteopenia      Allergies  Allergen Reactions  . Hydrocodone-Acetaminophen Shortness Of Breath  . Codeine Hives  . Morphine And Related Hives  . Nitrofurantoin Other (See Comments)    Makes hands and feet burn    Current Outpatient Prescriptions on File Prior to Visit  Medication Sig  . ADVAIR DISKUS 250-50 MCG/DOSE AEPB inhale 1 puff by mouth twice a day if needed for shortness of breath  . almotriptan (AXERT) 12.5 MG tablet take 1 tablet by mouth if needed migraines may repeat if needed maximum daily dose of 2  . ALPRAZolam (XANAX) 0.25 MG tablet take 1 tablet by mouth twice a day if needed  . AMITIZA 24 MCG capsule take 1 capsule by mouth twice a day  . Biotin 1000 MCG tablet Take 1,000 mcg by mouth daily. Reported on 10/18/2015  . cycloSPORINE (RESTASIS) 0.05 % ophthalmic emulsion Place 1 drop into both eyes 2 (two) times daily.    Marland Kitchen dextroamphetamine (DEXEDRINE SPANSULE) 10 MG 24 hr capsule Take 10 mg by mouth daily.   Marland Kitchen docusate sodium (COLACE) 100 MG capsule Take 100 mg by mouth as needed for mild constipation.  . Esomeprazole Magnesium (NEXIUM 24HR) 20 MG TBEC Take 1 tablet by mouth 2 (two) times daily.  Marland Kitchen estradiol (VIVELLE-DOT) 0.05 MG/24HR patch Place 1 patch (0.05 mg total) onto the skin 2 (two) times a week.  . fluticasone (FLONASE) 50 MCG/ACT nasal spray instill 2 sprays into each nostril once daily  . LATISSE 0.03 % ophthalmic solution PLACE 1 DROP ON APPLICATOR AND APPLY EVENLY ALONG  THE SKIN OF THE UPPER EYELID AT BASE OF EYELASHES ONCE DAILY AT BEDTIME REPEAT PROCEDURE F  . levocetirizine (XYZAL) 5 MG tablet Take 1 tablet (5 mg total) by mouth every evening.  . loratadine (CLARITIN) 10 MG tablet Take 10 mg by mouth daily.  . magnesium gluconate (MAGONATE) 500 MG tablet Take 500 mg by mouth daily.  . montelukast (SINGULAIR) 10 MG tablet take 1 tablet by mouth once daily  . ondansetron (ZOFRAN) 8  MG tablet take 1 tablet by mouth every 8 hours if needed for nausea and vomiting  . Probiotic Product (PROBIOTIC DAILY PO) Take 1 tablet by mouth daily.  . RELPAX 40 MG tablet take 1 tablet by mouth immediately if needed for headache .  Marland Kitchen SAVELLA 100 MG TABS tablet take 1 to 2 tablets by mouth once daily as directed for MOOD and depression  . Sennosides (SENOKOT PO) Take 1 tablet by mouth daily.  . traZODone (DESYREL) 50 MG tablet Take 25 mg by mouth at bedtime.  . triamterene-hydrochlorothiazide (MAXZIDE) 75-50 MG tablet take 1 tablet by mouth once daily for high blood pressure and fluid retention  . VITAMIN D, ERGOCALCIFEROL, PO Take by mouth. 5,000 IU daily   Current Facility-Administered Medications on File Prior to Visit  Medication  . 0.9 %  sodium chloride infusion    ROS: all negative except above.   Physical Exam:  BP 110/68   Pulse 62   Temp (!) 97.5 F (36.4 C)   Resp 16   Ht 5' 1.5" (1.562 m)   Wt 105 lb 3.2 oz (47.7 kg)   SpO2 99%   BMI 19.56 kg/m   General Appearance: Well nourished, in no apparent distress. Neck: Supple.  Respiratory: Respiratory effort normal, BS equal bilaterally without rales, rhonchi, wheezing or stridor.  Cardio: RRR with no MRGs. Brisk peripheral pulses without edema.  Abdomen: Soft, + BS. Some suprapubic tenderness, bladder not distended, no guarding, rebound, hernias, masses. Lymphatics: Non tender without lymphadenopathy.  Musculoskeletal: normal gait. No CVA tenderness.  Skin: Warm, dry without rashes, lesions, ecchymosis.  Neuro:  Normal muscle tone, no cerebellar symptoms.  Psych: Awake and oriented X 3, normal affect, Insight and Judgment appropriate.     Dan Maker, NP 10:54 AM [facility]

## 2017-03-23 NOTE — Patient Instructions (Addendum)
Sulfamethoxazole; Trimethoprim, SMX-TMP tablets What is this medicine? SULFAMETHOXAZOLE; TRIMETHOPRIM or SMX-TMP (suhl fuh meth OK suh zohl; trye METH oh prim) is a combination of a sulfonamide antibiotic and a second antibiotic, trimethoprim. It is used to treat or prevent certain kinds of bacterial infections. It will not work for colds, flu, or other viral infections. This medicine may be used for other purposes; ask your health care provider or pharmacist if you have questions. COMMON BRAND NAME(S): Bacter-Aid DS, Bactrim, Bactrim DS, Septra, Septra DS What should I tell my health care provider before I take this medicine? They need to know if you have any of these conditions: -anemia -asthma -being treated with anticonvulsants -if you frequently drink alcohol containing drinks -kidney disease -liver disease -low level of folic acid or glucose-6-phosphate dehydrogenase -poor nutrition or malabsorption -porphyria -severe allergies -thyroid disorder -an unusual or allergic reaction to sulfamethoxazole, trimethoprim, sulfa drugs, other medicines, foods, dyes, or preservatives -pregnant or trying to get pregnant -breast-feeding How should I use this medicine? Take this medicine by mouth with a full glass of water. Follow the directions on the prescription label. Take your medicine at regular intervals. Do not take it more often than directed. Do not skip doses or stop your medicine early. Talk to your pediatrician regarding the use of this medicine in children. Special care may be needed. This medicine has been used in children as young as 2 months of age. Overdosage: If you think you have taken too much of this medicine contact a poison control center or emergency room at once. NOTE: This medicine is only for you. Do not share this medicine with others. What if I miss a dose? If you miss a dose, take it as soon as you can. If it is almost time for your next dose, take only that dose. Do  not take double or extra doses. What may interact with this medicine? Do not take this medicine with any of the following medications: -aminobenzoate potassium -dofetilide -metronidazole This medicine may also interact with the following medications: -ACE inhibitors like benazepril, enalapril, lisinopril, and ramipril -birth control pills -cyclosporine -digoxin -diuretics -indomethacin -medicines for diabetes -methenamine -methotrexate -phenytoin -potassium supplements -pyrimethamine -sulfinpyrazone -tricyclic antidepressants -warfarin This list may not describe all possible interactions. Give your health care provider a list of all the medicines, herbs, non-prescription drugs, or dietary supplements you use. Also tell them if you smoke, drink alcohol, or use illegal drugs. Some items may interact with your medicine. What should I watch for while using this medicine? Tell your doctor or health care professional if your symptoms do not improve. Drink several glasses of water a day to reduce the risk of kidney problems. Do not treat diarrhea with over the counter products. Contact your doctor if you have diarrhea that lasts more than 2 days or if it is severe and watery. This medicine can make you more sensitive to the sun. Keep out of the sun. If you cannot avoid being in the sun, wear protective clothing and use a sunscreen. Do not use sun lamps or tanning beds/booths. What side effects may I notice from receiving this medicine? Side effects that you should report to your doctor or health care professional as soon as possible: -allergic reactions like skin rash or hives, swelling of the face, lips, or tongue -breathing problems -fever or chills, sore throat -irregular heartbeat, chest pain -joint or muscle pain -pain or difficulty passing urine -red pinpoint spots on skin -redness, blistering, peeling or loosening of   the skin, including inside the mouth -unusual bleeding or  bruising -unusually weak or tired -yellowing of the eyes or skin Side effects that usually do not require medical attention (report to your doctor or health care professional if they continue or are bothersome): -diarrhea -dizziness -headache -loss of appetite -nausea, vomiting -nervousness This list may not describe all possible side effects. Call your doctor for medical advice about side effects. You may report side effects to FDA at 1-800-FDA-1088. Where should I keep my medicine? Keep out of the reach of children. Store at room temperature between 20 to 25 degrees C (68 to 77 degrees F). Protect from light. Throw away any unused medicine after the expiration date. NOTE: This sheet is a summary. It may not cover all possible information. If you have questions about this medicine, talk to your doctor, pharmacist, or health care provider.  2018 Elsevier/Gold Standard (2013-01-06 14:38:26)    Urinary Tract Infection, Adult A urinary tract infection (UTI) is an infection of any part of the urinary tract, which includes the kidneys, ureters, bladder, and urethra. These organs make, store, and get rid of urine in the body. UTI can be a bladder infection (cystitis) or kidney infection (pyelonephritis). What are the causes? This infection may be caused by fungi, viruses, or bacteria. Bacteria are the most common cause of UTIs. This condition can also be caused by repeated incomplete emptying of the bladder during urination. What increases the risk? This condition is more likely to develop if:  You ignore your need to urinate or hold urine for long periods of time.  You do not empty your bladder completely during urination.  You wipe back to front after urinating or having a bowel movement, if you are female.  You are uncircumcised, if you are female.  You are constipated.  You have a urinary catheter that stays in place (indwelling).  You have a weak defense (immune) system.  You  have a medical condition that affects your bowels, kidneys, or bladder.  You have diabetes.  You take antibiotic medicines frequently or for long periods of time, and the antibiotics no longer work well against certain types of infections (antibiotic resistance).  You take medicines that irritate your urinary tract.  You are exposed to chemicals that irritate your urinary tract.  You are female.  What are the signs or symptoms? Symptoms of this condition include:  Fever.  Frequent urination or passing small amounts of urine frequently.  Needing to urinate urgently.  Pain or burning with urination.  Urine that smells bad or unusual.  Cloudy urine.  Pain in the lower abdomen or back.  Trouble urinating.  Blood in the urine.  Vomiting or being less hungry than normal.  Diarrhea or abdominal pain.  Vaginal discharge, if you are female.  How is this diagnosed? This condition is diagnosed with a medical history and physical exam. You will also need to provide a urine sample to test your urine. Other tests may be done, including:  Blood tests.  Sexually transmitted disease (STD) testing.  If you have had more than one UTI, a cystoscopy or imaging studies may be done to determine the cause of the infections. How is this treated? Treatment for this condition often includes a combination of two or more of the following:  Antibiotic medicine.  Other medicines to treat less common causes of UTI.  Over-the-counter medicines to treat pain.  Drinking enough water to stay hydrated.  Follow these instructions at home:  Take  over-the-counter and prescription medicines only as told by your health care provider.  If you were prescribed an antibiotic, take it as told by your health care provider. Do not stop taking the antibiotic even if you start to feel better.  Avoid alcohol, caffeine, tea, and carbonated beverages. They can irritate your bladder.  Drink enough fluid  to keep your urine clear or pale yellow.  Keep all follow-up visits as told by your health care provider. This is important.  Make sure to: ? Empty your bladder often and completely. Do not hold urine for long periods of time. ? Empty your bladder before and after sex. ? Wipe from front to back after a bowel movement if you are female. Use each tissue one time when you wipe. Contact a health care provider if:  You have back pain.  You have a fever.  You feel nauseous or vomit.  Your symptoms do not get better after 3 days.  Your symptoms go away and then return. Get help right away if:  You have severe back pain or lower abdominal pain.  You are vomiting and cannot keep down any medicines or water. This information is not intended to replace advice given to you by your health care provider. Make sure you discuss any questions you have with your health care provider. Document Released: 03/11/2005 Document Revised: 11/13/2015 Document Reviewed: 04/22/2015 Elsevier Interactive Patient Education  2017 [employer].

## 2017-03-24 LAB — URINALYSIS W MICROSCOPIC + REFLEX CULTURE
Bilirubin Urine: NEGATIVE
Glucose, UA: NEGATIVE
HYALINE CAST: NONE SEEN /LPF
Hgb urine dipstick: NEGATIVE
Ketones, ur: NEGATIVE
Leukocyte Esterase: NEGATIVE
Nitrites, Initial: POSITIVE — AB
PH: 5.5 (ref 5.0–8.0)
Protein, ur: NEGATIVE
RBC / HPF: NONE SEEN /HPF (ref 0–2)
SPECIFIC GRAVITY, URINE: 1.017 (ref 1.001–1.03)
WBC, UA: NONE SEEN /HPF (ref 0–5)

## 2017-03-24 LAB — NO CULTURE INDICATED

## 2017-03-30 ENCOUNTER — Other Ambulatory Visit: Payer: Self-pay | Admitting: Internal Medicine

## 2017-04-01 ENCOUNTER — Other Ambulatory Visit: Payer: Self-pay | Admitting: Internal Medicine

## 2017-04-01 ENCOUNTER — Other Ambulatory Visit: Payer: Self-pay | Admitting: Physician Assistant

## 2017-06-17 ENCOUNTER — Other Ambulatory Visit: Payer: Self-pay | Admitting: Internal Medicine

## 2017-06-29 ENCOUNTER — Other Ambulatory Visit: Payer: Self-pay | Admitting: Internal Medicine

## 2017-07-02 ENCOUNTER — Ambulatory Visit (INDEPENDENT_AMBULATORY_CARE_PROVIDER_SITE_OTHER): Payer: Medicare Other | Admitting: Internal Medicine

## 2017-07-02 VITALS — BP 112/82 | HR 80 | Temp 98.1°F | Resp 16 | Ht 61.5 in | Wt 114.2 lb

## 2017-07-02 DIAGNOSIS — J0140 Acute pansinusitis, unspecified: Secondary | ICD-10-CM | POA: Diagnosis not present

## 2017-07-02 MED ORDER — PREDNISONE 20 MG PO TABS: ORAL_TABLET | ORAL | 0 refills | Status: DC

## 2017-07-02 MED ORDER — LEVOFLOXACIN 500 MG PO TABS: ORAL_TABLET | ORAL | 1 refills | Status: DC

## 2017-07-02 NOTE — Progress Notes (Signed)
Subjective:    Patient ID: Alexis Weaver, female    DOB: January 19, 1951, 67 y.o.   MRN: [id number]  HPI  This very nice 67 yo MWF presents with c/o constant post-nasal sinus drainage and sinus pressure. Nasal secretions are reported mucopurulent. Currently on Xyzal or Claritin, Singulair, Flonase. Allergy shots no help in past.   Medication Sig  . ADVAIR 250-50  inhale 1 puff by mouth twice a day if needed  . AXERT 12.5 MG tablet take 1 tab if needed migraines may repeat if needed  . ALPRAZolam  0.25 MG  take 1 tablet by mouth twice a day if needed  . AMITIZA 24 MCG cap take 1 capsule by mouth twice a day  . VITAMIN D Take 5,000 Units by mouth daily.  . RESTASIS 0.05 %  Place 1 drop into both eyes 2 (two) times daily.    Marland Kitchen. DEXEDRINE  10 MG 24 hr Take  daily.   Marland Kitchen. docusate  100 MG  Take as needed for mild constipation.  . Esomeprazole 20 MG  Take 1 tablet  2 times daily.  Marland Kitchen. VIVELLE-DOT 0.05 MG/24HR  Place 1 patch onto the skin 2 times a week.  Marland Kitchen. FLONASE nasal spray instill 2 sprays into each nostril once daily  . LATISSE 0.03 % ophth soln place 1 drop  once daily at bedtime  . loratadine  10 MG tablet Take 10 mg  daily.  . magnesium  500 MG Take  daily.  Marland Kitchen. SINGULAIR 10 MG take 1 tablet  once daily  . ondansetron  8 MG  take 1 tab every 8 hours if needed  . PROBIOTIC DAILY Take 1 tab daily.  . RELPAX 40 MG take 1 tab immediately if needed for headache .  Marland Kitchen. SAVELLA 100 MG  take 1 to 2 tab once daily as directed   . SENOKOT Take 1 tab daily.  . traZODone  50 MG t Take 25 mg  at bedtime.  . triamterene-hctz 75-50 MG  take 1 tablet  once daily   . Biotin 1000 MCG t Take 1,000 mcg daily  . VITAMIN D 5,000 IU Take  daily   Allergies  Allergen Reactions  . Hydrocodone-Acetaminophen Shortness Of Breath  . Codeine Hives  . Morphine And Related Hives  . Nitrofurantoin Other (See Comments)    Makes hands and feet burn   Past Medical History:  Diagnosis Date  . Allergy   . Anemia 2016  .  Anxiety   . Asthma   . Chronic headaches   . Chronic sinusitis   . Depression   . Fibromyalgia   . GERD (gastroesophageal reflux disease)   . IBS (irritable bowel syndrome)   . Meckel diverticulum 1997  . Meckel's diverticulum   . Neuromuscular disorder (HCC)    fibromyagia  . Osteopenia    Review of Systems  10 point systems review negative except as above.    Objective:   Physical Exam  BP 112/82   Pulse 80   Temp 98.1 F (36.7 C)   Resp 16   Ht 5' 1.5" (1.562 m)   Wt 114 lb 3.2 oz (51.8 kg)   BMI 21.23 kg/m   In No Distress. Dry hacking cough.  HEENT - Eac's patent. TM's Nl. EOM's full. (+) tender frontal/maxillary sinus areas. NasoOroPharynx clear. Neck - supple. Nl Thyroid. Carotids 2+ & No bruits, nodes, JVD Chest - Clear equal BS w/o rales, rhonchi, wheezes. Cor - Nl HS. RRR w/o sig  MGR. PP 1(+). No edema.  MS- FROM w/o deformities. Muscle power, tone and bulk Nl. Gait Nl. Neuro - Nl w/o focal abnormalities. Skin - exposed clear w/o rash cyanosis or icterus.    Assessment & Plan:   1. Subacute pansinusitis  - predniSONE (DELTASONE) 20 MG tablet; 1 tab 3 x day for 3 days, then 1 tab 2 x day for 3 days, then 1 tab 1 x day for 5 days  Dispense: 20 tablet; Refill: 0  - levofloxacin (LEVAQUIN) 500 MG tablet; Take 1 tablet daily with food for infection  Dispense: 15 tablet; Refill: 1

## 2017-07-07 DIAGNOSIS — R0989 Other specified symptoms and signs involving the circulatory and respiratory systems: Secondary | ICD-10-CM | POA: Insufficient documentation

## 2017-07-07 NOTE — Patient Instructions (Signed)

## 2017-07-07 NOTE — Progress Notes (Signed)
This very nice 67 y.o. MWF presents for 3 month follow up with Hypertension, Hyperlipidemia, Pre-Diabetes and Vitamin D Deficiency. Other problems include controlled Depression and GERD & IBS, both likewise controlled. Patient also is followed for chronic Migraines. Patient is currently on Levaquin/prednisone for a sinusitis.      Patient is treated for labile HTN controlled w/ diuretics  & BP has been controlled at home. Today's BP is at goal - 128/82. Patient has had no complaints of any cardiac type chest pain, palpitations, dyspnea / orthopnea / PND, dizziness, claudication, or dependent edema.     Hyperlipidemia is controlled with diet.  Last Lipids were at goal with a very high HDL 111.  Lab Results  Component Value Date   CHOL 199 02/23/2017   HDL 111 02/23/2017   LDLCALC 70 12/04/2016   TRIG 39 02/23/2017   CHOLHDL 1.8 02/23/2017      Also, the patient has history of PreDiabetes A1c 5.9%/2015 and 6.0%/2016)  and has had no symptoms of reactive hypoglycemia, diabetic polys, paresthesias or visual blurring.  Last A1c was Normal & at goal: Lab Results  Component Value Date   HGBA1C 5.5 08/24/2016      Further, the patient also has history of Vitamin D Deficiency ("40" on treatment in 2016) and supplements vitamin D without any suspected side-effects. Last vitamin D was at goal:  Lab Results  Component Value Date   VD25OH 2785 02/23/2017   Current Outpatient Medications on File Prior to Visit  Medication Sig  . ADVAIR DISKUS 250-50 MCG/DOSE AEPB inhale 1 puff by mouth twice a day if needed for shortness of breath  . almotriptan (AXERT) 12.5 MG tablet take 1 tablet by mouth if needed migraines may repeat if needed maximum daily dose of 2  . ALPRAZolam (XANAX) 0.25 MG tablet take 1 tablet by mouth twice a day if needed  . AMITIZA 24 MCG capsule take 1 capsule by mouth twice a day  . Cholecalciferol (VITAMIN D PO) Take 5,000 Units by mouth daily.  . cycloSPORINE (RESTASIS) 0.05 %  ophthalmic emulsion Place 1 drop into both eyes 2 (two) times daily.    Marland Kitchen. dextroamphetamine (DEXEDRINE SPANSULE) 10 MG 24 hr capsule Take 10 mg by mouth daily.   Marland Kitchen. docusate sodium (COLACE) 100 MG capsule Take 100 mg by mouth as needed for mild constipation.  . Esomeprazole Magnesium (NEXIUM 24HR) 20 MG TBEC Take 1 tablet by mouth 2 (two) times daily.  Marland Kitchen. estradiol (VIVELLE-DOT) 0.05 MG/24HR patch Place 1 patch (0.05 mg total) onto the skin 2 (two) times a week.  . fluticasone (FLONASE) 50 MCG/ACT nasal spray instill 2 sprays into each nostril once daily  . LATISSE 0.03 % ophthalmic solution place 1 drop ON APPLICATOR AND APPLY EVENLY ALONG SKIN OF UPPER EYELIDS AT BASE OF EYELASHES once daily at bedtime  . levocetirizine (XYZAL) 5 MG tablet Take 1 tablet (5 mg total) by mouth every evening.  Marland Kitchen. levofloxacin (LEVAQUIN) 500 MG tablet Take 1 tablet daily with food for infection  . loratadine (CLARITIN) 10 MG tablet Take 10 mg by mouth daily.  . magnesium gluconate (MAGONATE) 500 MG tablet Take 500 mg by mouth daily.  . montelukast (SINGULAIR) 10 MG tablet take 1 tablet by mouth once daily  . ondansetron (ZOFRAN) 8 MG tablet take 1 tablet by mouth every 8 hours if needed for nausea and vomiting  . predniSONE (DELTASONE) 20 MG tablet 1 tab 3 x day for 3 days,  then 1 tab 2 x day for 3 days, then 1 tab 1 x day for 5 days  . Probiotic Product (PROBIOTIC DAILY PO) Take 1 tablet by mouth daily.  . RELPAX 40 MG tablet take 1 tablet by mouth immediately if needed for headache .  Marland Kitchen SAVELLA 100 MG TABS tablet take 1 to 2 tablets by mouth once daily as directed for MOOD and depression  . Sennosides (SENOKOT PO) Take 1 tablet by mouth daily.  . traZODone (DESYREL) 50 MG tablet Take 25 mg by mouth at bedtime.  . triamterene-hydrochlorothiazide (MAXZIDE) 75-50 MG tablet take 1 tablet by mouth once daily for high blood pressure and fluid retention   No current facility-administered medications on file prior to  visit.    Allergies  Allergen Reactions  . Hydrocodone-Acetaminophen Shortness Of Breath  . Codeine Hives  . Morphine And Related Hives  . Nitrofurantoin Other (See Comments)    Makes hands and feet burn   PMHx:   Past Medical History:  Diagnosis Date  . Allergy   . Anemia 2016  . Anxiety   . Asthma   . Chronic headaches   . Chronic sinusitis   . Depression   . Fibromyalgia   . GERD (gastroesophageal reflux disease)   . IBS (irritable bowel syndrome)   . Meckel diverticulum 1997  . Meckel's diverticulum   . Neuromuscular disorder (HCC)    fibromyagia  . Osteopenia    Immunization History  Administered Date(s) Administered  . DT 11/06/2004  . Influenza-Unspecified 02/14/2012  . Tdap 02/26/2011   Past Surgical History:  Procedure Laterality Date  . ABDOMINAL HYSTERECTOMY    . APPENDECTOMY  1997  . BREAST ENHANCEMENT SURGERY    . CESAREAN SECTION  1979, 1983   x 2  . HEMORRHOID BANDING    . LAPAROSCOPIC ABDOMINAL EXPLORATION     peritonitis, appendectomy   FHx:    Reviewed / unchanged  SHx:    Reviewed / unchanged  Systems Review:  Constitutional: Denies fever, chills, wt changes, headaches, insomnia, fatigue, night sweats, change in appetite. Eyes: Denies redness, blurred vision, diplopia, discharge, itchy, watery eyes.  ENT: Denies discharge, congestion, post nasal drip, epistaxis, sore throat, earache, hearing loss, dental pain, tinnitus, vertigo, sinus pain, snoring.  CV: Denies chest pain, palpitations, irregular heartbeat, syncope, dyspnea, diaphoresis, orthopnea, PND, claudication or edema. Respiratory: denies cough, dyspnea, DOE, pleurisy, hoarseness, laryngitis, wheezing.  Gastrointestinal: Denies dysphagia, odynophagia, heartburn, reflux, water brash, abdominal pain or cramps, nausea, vomiting, bloating, diarrhea, constipation, hematemesis, melena, hematochezia  or hemorrhoids. Genitourinary: Denies dysuria, frequency, urgency, nocturia, hesitancy,  discharge, hematuria or flank pain. Musculoskeletal: Denies arthralgias, myalgias, stiffness, jt. swelling, pain, limping or strain/sprain.  Skin: Denies pruritus, rash, hives, warts, acne, eczema or change in skin lesion(s). Neuro: No weakness, tremor, incoordination, spasms, paresthesia or pain. Psychiatric: Denies confusion, memory loss or sensory loss. Endo: Denies change in weight, skin or hair change.  Heme/Lymph: No excessive bleeding, bruising or enlarged lymph nodes.  Physical Exam  BP 128/82   Pulse 88   Temp (!) 97.5 F (36.4 C)   Resp 16   Ht 5' 1.5" (1.562 m)   Wt 114 lb 3.2 oz (51.8 kg)   BMI 21.23 kg/m   Appears well nourished, well groomed  and in no distress.  Eyes: PERRLA, EOMs, conjunctiva no swelling or erythema. Sinuses: No frontal/maxillary tenderness ENT/Mouth: EAC's clear, TM's nl w/o erythema, bulging. Nares clear w/o erythema, swelling, exudates. Oropharynx clear without erythema or exudates. Oral  hygiene is good. Tongue normal, non obstructing. Hearing intact.  Neck: Supple. Thyroid nl. Car 2+/2+ without bruits, nodes or JVD. Chest: Respirations nl with BS clear & equal w/o rales, rhonchi, wheezing or stridor.  Cor: Heart sounds normal w/ regular rate and rhythm without sig. murmurs, gallops, clicks or rubs. Peripheral pulses normal and equal  without edema.  Abdomen: Soft & bowel sounds normal. Non-tender w/o guarding, rebound, hernias, masses or organomegaly.  Lymphatics: Unremarkable.  Musculoskeletal: Full ROM all peripheral extremities, joint stability, 5/5 strength and normal gait.  Skin: Warm, dry without exposed rashes, lesions or ecchymosis apparent.  Neuro: Cranial nerves intact, reflexes equal bilaterally. Sensory-motor testing grossly intact. Tendon reflexes grossly intact.  Pysch: Alert & oriented x 3.  Insight and judgement nl & appropriate. No ideations.  Assessment and Plan:   1. Labile hypertension  - Continue medication, monitor  blood pressure at home.  - Continue DASH diet. Reminder to go to the ER if any CP,  SOB, nausea, dizziness, severe HA, changes vision/speech.  - CBC with Differential/Platelet - BASIC METABOLIC PANEL WITH GFR - Magnesium - TSH  2. Hyperlipidemia, mixed  - Continue diet/meds, exercise,& lifestyle modifications.  - Continue monitor periodic cholesterol/liver & renal functions  - Hepatic function panel - Lipid panel - TSH  3. Prediabetes  - Continue diet, exercise, lifestyle modifications.  - Monitor appropriate labs.  - Hemoglobin A1c - Insulin, random  4. Vitamin D deficiency  - Continue supplementation.  - VITAMIN D 25 Hydroxy  5. Abnormal glucose  - Hemoglobin A1c - Insulin, random  6. Medication management        Discussed  regular exercise, BP monitoring, weight control to achieve/maintain BMI less than 25 and discussed med and SE's. Recommended labs to assess and monitor clinical status with further disposition pending results of labs. Over 30 minutes of exam, counseling, chart review was performed.

## 2017-07-08 ENCOUNTER — Encounter: Payer: Self-pay | Admitting: Internal Medicine

## 2017-07-08 ENCOUNTER — Ambulatory Visit (INDEPENDENT_AMBULATORY_CARE_PROVIDER_SITE_OTHER): Payer: Medicare Other | Admitting: Internal Medicine

## 2017-07-08 VITALS — BP 128/82 | HR 88 | Temp 97.5°F | Resp 16 | Ht 61.5 in | Wt 114.2 lb

## 2017-07-08 DIAGNOSIS — E559 Vitamin D deficiency, unspecified: Secondary | ICD-10-CM

## 2017-07-08 DIAGNOSIS — R7303 Prediabetes: Secondary | ICD-10-CM

## 2017-07-08 DIAGNOSIS — Z79899 Other long term (current) drug therapy: Secondary | ICD-10-CM

## 2017-07-08 DIAGNOSIS — R0989 Other specified symptoms and signs involving the circulatory and respiratory systems: Secondary | ICD-10-CM | POA: Diagnosis not present

## 2017-07-08 DIAGNOSIS — R7309 Other abnormal glucose: Secondary | ICD-10-CM

## 2017-07-08 DIAGNOSIS — E782 Mixed hyperlipidemia: Secondary | ICD-10-CM | POA: Diagnosis not present

## 2017-07-09 LAB — CBC WITH DIFFERENTIAL/PLATELET
BASOS ABS: 27 {cells}/uL (ref 0–200)
BASOS PCT: 0.4 %
EOS PCT: 1.8 %
Eosinophils Absolute: 122 cells/uL (ref 15–500)
HEMATOCRIT: 38.2 % (ref 35.0–45.0)
HEMOGLOBIN: 12.4 g/dL (ref 11.7–15.5)
LYMPHS ABS: 3495 {cells}/uL (ref 850–3900)
MCH: 28.4 pg (ref 27.0–33.0)
MCHC: 32.5 g/dL (ref 32.0–36.0)
MCV: 87.6 fL (ref 80.0–100.0)
MONOS PCT: 7.1 %
MPV: 10.3 fL (ref 7.5–12.5)
NEUTROS ABS: 2672 {cells}/uL (ref 1500–7800)
Neutrophils Relative %: 39.3 %
Platelets: 356 10*3/uL (ref 140–400)
RBC: 4.36 10*6/uL (ref 3.80–5.10)
RDW: 13.7 % (ref 11.0–15.0)
Total Lymphocyte: 51.4 %
WBC mixed population: 483 cells/uL (ref 200–950)
WBC: 6.8 10*3/uL (ref 3.8–10.8)

## 2017-07-09 LAB — LIPID PANEL
CHOLESTEROL: 239 mg/dL — AB (ref <200)
HDL: 142 mg/dL (ref >50)
LDL Cholesterol (Calc): 79 mg/dL (calc)
Non-HDL Cholesterol (Calc): 97 mg/dL (calc) (ref <130)
TRIGLYCERIDES: 95 mg/dL (ref <150)
Total CHOL/HDL Ratio: 1.7 (calc) (ref <5.0)

## 2017-07-09 LAB — HEPATIC FUNCTION PANEL
AG Ratio: 1.8 (calc) (ref 1.0–2.5)
ALBUMIN MSPROF: 4.4 g/dL (ref 3.6–5.1)
ALT: 22 U/L (ref 6–29)
AST: 22 U/L (ref 10–35)
Alkaline phosphatase (APISO): 52 U/L (ref 33–130)
BILIRUBIN DIRECT: 0.1 mg/dL (ref 0.0–0.2)
Globulin: 2.5 g/dL (calc) (ref 1.9–3.7)
Indirect Bilirubin: 0.4 mg/dL (calc) (ref 0.2–1.2)
Total Bilirubin: 0.5 mg/dL (ref 0.2–1.2)
Total Protein: 6.9 g/dL (ref 6.1–8.1)

## 2017-07-09 LAB — HEMOGLOBIN A1C
HEMOGLOBIN A1C: 5.8 %{Hb} — AB (ref <5.7)
Mean Plasma Glucose: 120 (calc)
eAG (mmol/L): 6.6 (calc)

## 2017-07-09 LAB — TSH: TSH: 1.95 mIU/L (ref 0.40–4.50)

## 2017-07-09 LAB — VITAMIN D 25 HYDROXY (VIT D DEFICIENCY, FRACTURES): VIT D 25 HYDROXY: 52 ng/mL (ref 30–100)

## 2017-07-09 LAB — BASIC METABOLIC PANEL WITH GFR
BUN: 22 mg/dL (ref 7–25)
CALCIUM: 9.9 mg/dL (ref 8.6–10.4)
CHLORIDE: 97 mmol/L — AB (ref 98–110)
CO2: 34 mmol/L — AB (ref 20–32)
Creat: 0.95 mg/dL (ref 0.50–0.99)
GFR, Est African American: 72 mL/min/{1.73_m2} (ref >=60)
GFR, Est Non African American: 62 mL/min/{1.73_m2} (ref >=60)
Glucose, Bld: 81 mg/dL (ref 65–99)
POTASSIUM: 3.9 mmol/L (ref 3.5–5.3)
Sodium: 140 mmol/L (ref 135–146)

## 2017-07-09 LAB — INSULIN, RANDOM: INSULIN: 2.7 u[IU]/mL (ref 2.0–19.6)

## 2017-07-09 LAB — MAGNESIUM: MAGNESIUM: 1.7 mg/dL (ref 1.5–2.5)

## 2017-07-14 ENCOUNTER — Other Ambulatory Visit: Payer: Self-pay | Admitting: Internal Medicine

## 2017-10-05 ENCOUNTER — Other Ambulatory Visit: Payer: Self-pay | Admitting: Internal Medicine

## 2017-10-05 MED ORDER — PREDNISONE 20 MG PO TABS: ORAL_TABLET | ORAL | 0 refills | Status: DC

## 2017-10-05 MED ORDER — AZITHROMYCIN 250 MG PO TABS: ORAL_TABLET | ORAL | 1 refills | Status: DC

## 2017-10-05 MED ORDER — BENZONATATE 200 MG PO CAPS: ORAL_CAPSULE | ORAL | 1 refills | Status: DC

## 2017-10-19 ENCOUNTER — Ambulatory Visit: Payer: Self-pay | Admitting: Adult Health

## 2017-10-24 ENCOUNTER — Other Ambulatory Visit: Payer: Self-pay | Admitting: Internal Medicine

## 2017-10-24 NOTE — Progress Notes (Signed)
MEDICARE WELLNESS [id number]  Assessment:   Prediabetes Discussed general issues about diabetes pathophysiology and management., Educational material distributed., Suggested low cholesterol diet., Encouraged aerobic exercise., Discussed foot care., Reminded to get yearly retinal exam.  Osteopenia Get DEXA with MGM, add on weight bearing exercises, due next year  Chronic maxillary sinusitis monitor   Vitamin D deficiency - Vit D  25 hydroxy (rtn osteoporosis monitoring)  Encounter for long-term (current) use of medications - CBC with Differential/Platelet - BASIC METABOLIC PANEL WITH GFR - Hepatic function panel - Magnesium  Major Depression in partial remission Follow up Dr. Lyanne Co, no SI/HI - TSH   Anxiety Follow up Dr. Lyanne Co   IBS (irritable bowel syndrome)-constipation type Continue amitza  Asthma, chronic, unspecified asthma severity, uncomplicated No signs of infection at this time, will check CBC, get back on advair and allergy   Anemia, unspecified anemia type Check CBC   Over 30 minutes of exam, counseling, chart review and critical decision making was performed Future Appointments  Date Time Provider Department Center  03/01/2018 10:00 AM Quentin Mulling, PA-C [id number] None    Plan:   During the course of the visit the patient was educated and counseled about appropriate screening and preventive services including:    Pneumococcal vaccine   Prevnar 13  Influenza vaccine  Td vaccine  Screening electrocardiogram  Bone densitometry screening  Colorectal cancer screening  Diabetes screening  Glaucoma screening  Nutrition counseling   Advanced directives: requested   Subjective:  Alexis Weaver is a 67 y.o. female who presents for medicare wellness and follow up   Her blood pressure has been controlled at home, today their BP is BP: 124/68 She does workout. She denies chest pain, shortness of breath, dizziness.   She is not on cholesterol  medication and denies myalgias. Her cholesterol is at goal. The cholesterol last visit was:   Lab Results  Component Value Date   CHOL 239 (H) 07/08/2017   HDL 142 07/08/2017   LDLCALC 79 07/08/2017   TRIG 95 07/08/2017   CHOLHDL 1.7 07/08/2017   Last Z6X  Lab Results  Component Value Date   HGBA1C 5.8 (H) 07/08/2017   Lab Results  Component Value Date   GFRNONAA 62 07/08/2017   Patient is on Vitamin D supplement.   Lab Results  Component Value Date   VD25OH 52 07/08/2017     She has had negative ANA, AntiDNA, RF. 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  She is on estrogen therapy and on bASA.  She sees a therapist and Dr. Evelene Croon. She states she is taking xanax just at night for sleep.  Sees Dr. Ezzard Standing and Troy Callas for allergies/asthma, allergies have been worse this past month, lots of left sided sinus pressure. Has tried allergy shots x 2 without help. No fever chills. Does better with prednisone low dose occ.   Medication Review: Current Outpatient Medications on File Prior to Visit  Medication Sig Dispense Refill  . ADVAIR DISKUS 250-50 MCG/DOSE AEPB inhale 1 puff by mouth twice a day if needed for shortness of breath 60 each 11  . almotriptan (AXERT) 12.5 MG tablet take 1 tablet by mouth if needed migraines may repeat if needed maximum daily dose of 2 12 tablet 2  . ALPRAZolam (XANAX) 0.25 MG tablet take 1 tablet by mouth twice a day if needed 60 tablet 3  . AMITIZA 24 MCG capsule take 1 capsule by mouth twice a  day 180 capsule 1  . Cholecalciferol (VITAMIN D PO) Take 5,000 Units by mouth daily.    . cycloSPORINE (RESTASIS) 0.05 % ophthalmic emulsion Place 1 drop into both eyes 2 (two) times daily.      Marland Kitchen dextroamphetamine (DEXEDRINE SPANSULE) 10 MG 24 hr capsule Take 10 mg by mouth daily.   0  . docusate sodium (COLACE) 100 MG capsule Take 100 mg by mouth as needed for mild constipation.    Marland Kitchen estradiol (VIVELLE-DOT) 0.05 MG/24HR patch UNWRAP  AND APPLY 1 PATCH TO TWICE A WEEK 24 patch 3  . fluticasone (FLONASE) 50 MCG/ACT nasal spray instill 2 sprays into each nostril once daily 48 g 1  . LATISSE 0.03 % ophthalmic solution place 1 drop ON APPLICATOR AND APPLY EVENLY ALONG SKIN OF UPPER EYELIDS AT BASE OF EYELASHES once daily at bedtime 3 mL 11  . loratadine (CLARITIN) 10 MG tablet Take 10 mg by mouth daily.    . magnesium gluconate (MAGONATE) 500 MG tablet Take 500 mg by mouth daily.    . montelukast (SINGULAIR) 10 MG tablet take 1 tablet by mouth once daily 90 tablet 1  . ondansetron (ZOFRAN) 8 MG tablet take 1 tablet by mouth every 8 hours if needed for nausea and vomiting 100 tablet 3  . RELPAX 40 MG tablet take 1 tablet by mouth immediately if needed for headache . 6 tablet 5  . SAVELLA 100 MG TABS tablet take 1 to 2 tablets by mouth once daily as directed for MOOD and depression 60 tablet 5  . Sennosides (SENOKOT PO) Take 1 tablet by mouth daily.    . traZODone (DESYREL) 50 MG tablet Take 25 mg by mouth at bedtime.    . triamterene-hydrochlorothiazide (MAXZIDE) 75-50 MG tablet take 1 tablet by mouth once daily for high blood pressure and fluid retention 90 tablet 1   No current facility-administered medications on file prior to visit.     Allergies  Allergen Reactions  . Hydrocodone-Acetaminophen Shortness Of Breath  . Codeine Hives  . Morphine And Related Hives  . Nitrofurantoin Other (See Comments)    Makes hands and feet burn    Current Problems (verified) Patient Active Problem List   Diagnosis Date Noted  . 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 Anemia 07/21/2015  . Prediabetes 06/26/2014  . Medication management 06/26/2014  . IBS (irritable bowel syndrome)-constipation type 06/26/2014  . Bilateral shoulder pain 06/26/2014  . Vitamin D deficiency 06/26/2014  . Asthma, chronic   . Chronic sinusitis   . Anxiety   . Major depression in partial remission (HCC)   . Osteopenia   . Palpitations 04/01/2010     Screening Tests Immunization History  Administered Date(s) Administered  . DT 11/06/2004  . Influenza-Unspecified 02/14/2012  . Pneumococcal Conjugate-13 10/26/2017  . Tdap 02/26/2011   Tetanus: 2012 Pneumovax: will get 1 year Prevnar 13: DUE Flu vaccine: declines Zostavax: N/A  Pap: 2016, declines another,never abnormal pap MGM: 12/2016 [facility] DEXA: 12/2015 due next year -2.3 right femur Colonoscopy: 2017  Dr. Russella Dar EGD: 10/2015 MRI brain 2010 CXR 2015  Names of Other Physician/Practitioners you currently use: 1. [facility] here for primary care 2. [place], eye doctor, last visit 2018 3. [place], dentist, last visit 2018 Patient Care Team: [name], MD as PCP - General (Internal Medicine) Sidney Ace, MD as Referring Physician (Allergy) Marcene Corning, MD as Consulting Physician (Orthopedic Surgery)  SURGICAL HISTORY She  has a past surgical history that includes Abdominal  hysterectomy; Cesarean section (1979, 1983); Breast enhancement surgery; Laparoscopic abdominal exploration; Hemorrhoid banding; and Appendectomy (1997). FAMILY HISTORY Her family history includes Asthma in her brother; COPD in her brother; Diabetes in her brother and father; Diverticulitis in her maternal grandmother; Hyperlipidemia in her brother; Hypertension in her father and mother; Rheum arthritis in her sister. SOCIAL HISTORY She  reports that she has never smoked. She has never used smokeless tobacco. She reports that she does not drink alcohol or use drugs.  MEDICARE WELLNESS OBJECTIVES: Physical activity:   Cardiac risk factors:   Depression/mood screen:   Depression screen [facility] 2/9 07/08/2017  Decreased Interest 1  Down, Depressed, Hopeless 1  PHQ - 2 Score 2  Altered sleeping 1  Tired, decreased energy 1  Change in appetite 0  Feeling bad or failure about yourself  1  Moving slowly or fidgety/restless 1  Suicidal thoughts 0  PHQ-9  Score 6  Difficult doing work/chores Somewhat difficult    ADLs:  In your present state of health, do you have any difficulty performing the following activities: 07/08/2017  Hearing? N  Vision? N  Difficulty concentrating or making decisions? N  Walking or climbing stairs? N  Dressing or bathing? N  Doing errands, shopping? N  Some recent data might be hidden     Cognitive Testing  Alert? Yes  Normal Appearance?Yes  Oriented to person? Yes  Place? Yes   Time? Yes  Recall of three objects?  Yes  Can perform simple calculations? Yes  Displays appropriate judgment?Yes  Can read the correct time from a watch face?Yes  EOL planning: Does Patient Have a Medical Advance Directive?: Yes Type of Advance Directive: Healthcare Power of Attorney, Living will Copy of Healthcare Power of Attorney in Chart?: No - copy requested     Review of Systems  Constitutional: Positive for malaise/fatigue. Negative for chills, diaphoresis, fever and weight loss.  HENT: Negative for congestion, ear discharge, ear pain, hearing loss, nosebleeds, sore throat and tinnitus.   Eyes: Negative.   Respiratory: Negative for cough, hemoptysis, sputum production, shortness of breath, wheezing and stridor.   Cardiovascular: Negative.   Gastrointestinal: Positive for constipation (on amitiza) and heartburn (better on nexium). Negative for abdominal pain, blood in stool, diarrhea, melena, nausea and vomiting.  Genitourinary: Positive for frequency. Negative for dysuria, flank pain, hematuria and urgency.  Musculoskeletal: Positive for myalgias. Negative for back pain, falls, joint pain and neck pain.  Skin: Negative.   Neurological: Negative.  Negative for weakness and headaches.  Psychiatric/Behavioral: Positive for depression. Negative for hallucinations, memory loss, substance abuse and suicidal ideas. The patient is not nervous/anxious.      Objective:     Today's Vitals   10/26/17 1138  BP: 124/68   Pulse: 63  Resp: 14  Temp: 97.7 F (36.5 C)  SpO2: 96%  Weight: 106 lb 9.6 oz (48.4 kg)  Height: 5' 1.5" (1.562 m)  PainSc: 0-No pain   Body mass index is 19.82 kg/m.  General appearance: alert, no distress, WD/WN, female HEENT: normocephalic, sclerae anicteric, TMs pearly, nares patent, no discharge or erythema, pharynx normal Oral cavity: MMM, no lesions Neck: supple, no lymphadenopathy, no thyromegaly, no masses Heart: RRR, normal S1, S2, no murmurs Lungs: CTA bilaterally, no wheezes, rhonchi, or rales Abdomen: +bs, soft, non tender, non distended, no masses, no hepatomegaly, no splenomegaly Musculoskeletal: nontender, no swelling, no obvious deformity, negative tinel's/phalen's, + impingement test neck, good distal neurovascular and grip Extremities: no edema, no cyanosis, no clubbing Pulses:  2+ symmetric, upper and lower extremities, normal cap refill Neurological: alert, oriented x 3, CN2-12 intact, strength normal upper extremities and lower extremities, sensation normal throughout, DTRs 2+ throughout, no cerebellar signs, gait normal Psychiatric: normal affect, behavior normal, pleasant    Medicare Attestation I have personally reviewed: The patient's medical and social history Their use of alcohol, tobacco or illicit drugs Their current medications and supplements The patient's functional ability including ADLs,fall risks, home safety risks, cognitive, and hearing and visual impairment Diet and physical activities Evidence for depression or mood disorders  The patient's weight, height, BMI, and visual acuity have been recorded in the chart.  I have made referrals, counseling, and provided education to the patient based on review of the above and I have provided the patient with a written personalized care plan for preventive services.     Quentin Mulling, PA-C   10/26/2017

## 2017-10-26 ENCOUNTER — Ambulatory Visit (INDEPENDENT_AMBULATORY_CARE_PROVIDER_SITE_OTHER): Payer: Medicare Other | Admitting: Physician Assistant

## 2017-10-26 ENCOUNTER — Ambulatory Visit: Payer: Self-pay | Admitting: Adult Health

## 2017-10-26 ENCOUNTER — Encounter: Payer: Self-pay | Admitting: Physician Assistant

## 2017-10-26 VITALS — BP 124/68 | HR 63 | Temp 97.7°F | Resp 14 | Ht 61.5 in | Wt 106.6 lb

## 2017-10-26 DIAGNOSIS — M25511 Pain in right shoulder: Secondary | ICD-10-CM

## 2017-10-26 DIAGNOSIS — Z0001 Encounter for general adult medical examination with abnormal findings: Secondary | ICD-10-CM

## 2017-10-26 DIAGNOSIS — Z23 Encounter for immunization: Secondary | ICD-10-CM

## 2017-10-26 DIAGNOSIS — F419 Anxiety disorder, unspecified: Secondary | ICD-10-CM

## 2017-10-26 DIAGNOSIS — D649 Anemia, unspecified: Secondary | ICD-10-CM | POA: Diagnosis not present

## 2017-10-26 DIAGNOSIS — E348 Other specified endocrine disorders: Secondary | ICD-10-CM | POA: Diagnosis not present

## 2017-10-26 DIAGNOSIS — R7303 Prediabetes: Secondary | ICD-10-CM

## 2017-10-26 DIAGNOSIS — R6889 Other general symptoms and signs: Secondary | ICD-10-CM | POA: Diagnosis not present

## 2017-10-26 DIAGNOSIS — J45909 Unspecified asthma, uncomplicated: Secondary | ICD-10-CM | POA: Diagnosis not present

## 2017-10-26 DIAGNOSIS — Z13220 Encounter for screening for lipoid disorders: Secondary | ICD-10-CM

## 2017-10-26 DIAGNOSIS — M8580 Other specified disorders of bone density and structure, unspecified site: Secondary | ICD-10-CM | POA: Diagnosis not present

## 2017-10-26 DIAGNOSIS — F3341 Major depressive disorder, recurrent, in partial remission: Secondary | ICD-10-CM | POA: Diagnosis not present

## 2017-10-26 DIAGNOSIS — R0989 Other specified symptoms and signs involving the circulatory and respiratory systems: Secondary | ICD-10-CM | POA: Diagnosis not present

## 2017-10-26 DIAGNOSIS — M25512 Pain in left shoulder: Secondary | ICD-10-CM

## 2017-10-26 DIAGNOSIS — Z681 Body mass index (BMI) 19 or less, adult: Secondary | ICD-10-CM | POA: Diagnosis not present

## 2017-10-26 DIAGNOSIS — Z79899 Other long term (current) drug therapy: Secondary | ICD-10-CM

## 2017-10-26 DIAGNOSIS — J320 Chronic maxillary sinusitis: Secondary | ICD-10-CM

## 2017-10-26 DIAGNOSIS — E559 Vitamin D deficiency, unspecified: Secondary | ICD-10-CM | POA: Diagnosis not present

## 2017-10-26 DIAGNOSIS — Z0000 Encounter for general adult medical examination without abnormal findings: Secondary | ICD-10-CM

## 2017-10-26 MED ORDER — PREDNISONE 10 MG PO TABS: ORAL_TABLET | ORAL | 0 refills | Status: DC

## 2017-10-26 NOTE — Patient Instructions (Addendum)
Ways to prevent diarrhea with magnesium:  1) Don't take all your magnesium at the same time, have 2-3 smaller doses through out the day 2) Try taking your magnesium with high fiber meals.  3) If this does not help, take the magnesium on an empty stomach. Fiber for some people can bind the magnesium too well and prevent absorption in your gut.  4) Lastly try different types of magnesium. Most people are taking magnesium citrate, you can also try dimalate capsules which are slow release. You can also find magnesium lotions/sprays for the skin that bypass the gut. Another one that has good absorption is ReMag (pico-iconic magnesium formula), this has great cellular absorption so less of a laxative effect. You can find these type at health food stores or online.    Ask insurance and pharmacy about shingrix - new vaccine   Can go to TripleFare.com.cy for more information  Shingrix Vaccination  Two vaccines are licensed and recommended to prevent shingles in the [place] Zoster vaccine live (ZVL, Zostavax) has been in use since 2006. Recombinant zoster vaccine (RZV, Shingrix), has been in use since 2017 and is recommended by ACIP as the preferred shingles vaccine.  What Everyone Should Know about Shingles Vaccine (Shingrix) One of the Recommended Vaccines by Disease Shingles vaccination is the only way to protect against shingles and postherpetic neuralgia (PHN), the most common complication from shingles. CDC recommends that healthy adults 50 years and older get two doses of the shingles vaccine called Shingrix (recombinant zoster vaccine), separated by 2 to 6 months, to prevent shingles and the complications from the disease. Your doctor or pharmacist can give you Shingrix as a shot in your upper arm. Shingrix provides strong protection against shingles and PHN. Two doses of Shingrix is more than 90% effective at preventing shingles and PHN.  Protection stays above 85% for at least the first four years after you get vaccinated. Shingrix is the preferred vaccine, over Zostavax (zoster vaccine live), a shingles vaccine in use since 2006. Zostavax may still be used to prevent shingles in healthy adults 60 years and older. For example, you could use Zostavax if a person is allergic to Shingrix, prefers Zostavax, or requests immediate vaccination and Shingrix is unavailable. Who Should Get Shingrix? Healthy adults 50 years and older should get two doses of Shingrix, separated by 2 to 6 months. You should get Shingrix even if in the past you . had shingles  . received Zostavax  . are not sure if you had chickenpox There is no maximum age for getting Shingrix. If you had shingles in the past, you can get Shingrix to help prevent future occurrences of the disease. There is no specific length of time that you need to wait after having shingles before you can receive Shingrix, but generally you should make sure the shingles rash has gone away before getting vaccinated. You can get Shingrix whether or not you remember having had chickenpox in the past. Studies show that more than 99% of Americans 40 years and older have had chickenpox, even if they don't remember having the disease. Chickenpox and shingles are related because they are caused by the same virus (varicella zoster virus). After a person recovers from chickenpox, the virus stays dormant (inactive) in the body. It can reactivate years later and cause shingles. If you had Zostavax in the recent past, you should wait at least eight weeks before getting Shingrix. Talk to your healthcare provider to determine the best time to get  Shingrix. Shingrix is available in [employer] and pharmacies. To find doctor's offices or pharmacies near you that offer the vaccine, visit HealthMap Vaccine FinderExternal. If you have questions about Shingrix, talk with your healthcare provider. Vaccine for  Those 50 Years and Older  Shingrix reduces the risk of shingles and PHN by more than 90% in people 74 and older. CDC recommends the vaccine for healthy adults 50 and older.  Who Should Not Get Shingrix? You should not get Shingrix if you: . have ever had a severe allergic reaction to any component of the vaccine or after a dose of Shingrix  . tested negative for immunity to varicella zoster virus. If you test negative, you should get chickenpox vaccine.  . currently have shingles  . currently are pregnant or breastfeeding. Women who are pregnant or breastfeeding should wait to get Shingrix.  Marland Kitchen receive specific antiviral drugs (acyclovir, famciclovir, or valacyclovir) 24 hours before vaccination (avoid use of these antiviral drugs for 14 days after vaccination)- zoster vaccine live only If you have a minor acute (starts suddenly) illness, such as a cold, you may get Shingrix. But if you have a moderate or severe acute illness, you should usually wait until you recover before getting the vaccine. This includes anyone with a temperature of 101.91F or higher. The side effects of the Shingrix are temporary, and usually last 2 to 3 days. While you may experience pain for a few days after getting Shingrix, the pain will be less severe than having shingles and the complications from the disease. How Well Does Shingrix Work? Two doses of Shingrix provides strong protection against shingles and postherpetic neuralgia (PHN), the most common complication of shingles. . In adults 69 to 67 years old who got two doses, Shingrix was 97% effective in preventing shingles; among adults 70 years and older, Shingrix was 91% effective.  . In adults 20 to 67 years old who got two doses, Shingrix was 91% effective in preventing PHN; among adults 70 years and older, Shingrix was 89% effective. Shingrix protection remained high (more than 85%) in people 70 years and older throughout the four years following vaccination. Since  your risk of shingles and PHN increases as you get older, it is important to have strong protection against shingles in your older years. Top of Page  What Are the Possible Side Effects of Shingrix? Studies show that Shingrix is safe. The vaccine helps your body create a strong defense against shingles. As a result, you are likely to have temporary side effects from getting the shots. The side effects may affect your ability to do normal daily activities for 2 to 3 days. Most people got a sore arm with mild or moderate pain after getting Shingrix, and some also had redness and swelling where they got the shot. Some people felt tired, had muscle pain, a headache, shivering, fever, stomach pain, or nausea. About 1 out of 6 people who got Shingrix experienced side effects that prevented them from doing regular activities. Symptoms went away on their own in about 2 to 3 days. Side effects were more common in younger people. You might have a reaction to the first or second dose of Shingrix, or both doses. If you experience side effects, you may choose to take over-the-counter pain medicine such as ibuprofen or acetaminophen. If you experience side effects from Shingrix, you should report them to the Vaccine Adverse Event Reporting System (VAERS). Your doctor might file this report, or you can do it yourself  through the VAERS websiteExternal, or by calling 1-667-625-5083. If you have any questions about side effects from Shingrix, talk with your doctor. The shingles vaccine does not contain thimerosal (a preservative containing mercury). Top of Page  When Should I See a Doctor Because of the Side Effects I Experience From Shingrix? In clinical trials, Shingrix was not associated with serious adverse events. In fact, serious side effects from vaccines are extremely rare. For example, for every 1 million doses of a vaccine given, only one or two people may have a severe allergic reaction. Signs of an allergic  reaction happen within minutes or hours after vaccination and include hives, swelling of the face and throat, difficulty breathing, a fast heartbeat, dizziness, or weakness. If you experience these or any other life-threatening symptoms, see a doctor right away. Shingrix causes a strong response in your immune system, so it may produce short-term side effects more intense than you are used to from other vaccines. These side effects can be uncomfortable, but they are expected and usually go away on their own in 2 or 3 days. Top of Page  How Can I Pay For Shingrix? There are several ways shingles vaccine may be paid for: Medicare . Medicare Part D plans cover the shingles vaccine, but there may be a cost to you depending on your plan. There may be a copay for the vaccine, or you may need to pay in full then get reimbursed for a certain amount.  . Medicare Part B does not cover the shingles vaccine. Medicaid . Medicaid may or may not cover the vaccine. Contact your insurer to find out. Private health insurance . Many private health insurance plans will cover the vaccine, but there may be a cost to you depending on your plan. Contact your insurer to find out. Vaccine assistance programs . Some pharmaceutical companies provide vaccines to eligible adults who cannot afford them. You may want to check with the vaccine manufacturer, [employer], about Shingrix. If you do not currently have health insurance, learn more about affordable health coverage optionsExternal. To find doctor's offices or pharmacies near you that offer the vaccine, visit HealthMap Vaccine FinderExternal.

## 2017-10-27 LAB — HEMOGLOBIN A1C
Hgb A1c MFr Bld: 5.8 % of total Hgb — ABNORMAL HIGH (ref <5.7)
Mean Plasma Glucose: 120 (calc)
eAG (mmol/L): 6.6 (calc)

## 2017-10-27 LAB — CBC WITH DIFFERENTIAL/PLATELET
BASOS ABS: 69 {cells}/uL (ref 0–200)
Basophils Relative: 1.4 %
EOS ABS: 59 {cells}/uL (ref 15–500)
EOS PCT: 1.2 %
HCT: 36.9 % (ref 35.0–45.0)
Hemoglobin: 12.2 g/dL (ref 11.7–15.5)
Lymphs Abs: 1715 cells/uL (ref 850–3900)
MCH: 27.8 pg (ref 27.0–33.0)
MCHC: 33.1 g/dL (ref 32.0–36.0)
MCV: 84.1 fL (ref 80.0–100.0)
MONOS PCT: 8.3 %
MPV: 10.4 fL (ref 7.5–12.5)
NEUTROS ABS: 2651 {cells}/uL (ref 1500–7800)
NEUTROS PCT: 54.1 %
PLATELETS: 280 10*3/uL (ref 140–400)
RBC: 4.39 10*6/uL (ref 3.80–5.10)
RDW: 14.4 % (ref 11.0–15.0)
Total Lymphocyte: 35 %
WBC mixed population: 407 cells/uL (ref 200–950)
WBC: 4.9 10*3/uL (ref 3.8–10.8)

## 2017-10-27 LAB — COMPLETE METABOLIC PANEL WITH GFR
AG Ratio: 1.9 (calc) (ref 1.0–2.5)
ALBUMIN MSPROF: 4.3 g/dL (ref 3.6–5.1)
ALKALINE PHOSPHATASE (APISO): 59 U/L (ref 33–130)
ALT: 20 U/L (ref 6–29)
AST: 26 U/L (ref 10–35)
BUN: 19 mg/dL (ref 7–25)
CALCIUM: 9.6 mg/dL (ref 8.6–10.4)
CO2: 30 mmol/L (ref 20–32)
CREATININE: 0.92 mg/dL (ref 0.50–0.99)
Chloride: 99 mmol/L (ref 98–110)
GFR, EST NON AFRICAN AMERICAN: 65 mL/min/{1.73_m2} (ref >=60)
GFR, Est African American: 75 mL/min/{1.73_m2} (ref >=60)
GLOBULIN: 2.3 g/dL (ref 1.9–3.7)
Glucose, Bld: 101 mg/dL — ABNORMAL HIGH (ref 65–99)
Potassium: 4.6 mmol/L (ref 3.5–5.3)
SODIUM: 138 mmol/L (ref 135–146)
TOTAL PROTEIN: 6.6 g/dL (ref 6.1–8.1)
Total Bilirubin: 0.5 mg/dL (ref 0.2–1.2)

## 2017-10-27 LAB — VITAMIN D 25 HYDROXY (VIT D DEFICIENCY, FRACTURES): Vit D, 25-Hydroxy: 66 ng/mL (ref 30–100)

## 2017-10-27 LAB — LIPID PANEL
CHOL/HDL RATIO: 2.1 (calc) (ref <5.0)
CHOLESTEROL: 208 mg/dL — AB (ref <200)
HDL: 100 mg/dL (ref >50)
LDL CHOLESTEROL (CALC): 94 mg/dL
Non-HDL Cholesterol (Calc): 108 mg/dL (calc) (ref <130)
TRIGLYCERIDES: 62 mg/dL (ref <150)

## 2017-10-27 LAB — TSH: TSH: 1.36 m[IU]/L (ref 0.40–4.50)

## 2017-10-27 LAB — MAGNESIUM: MAGNESIUM: 1.9 mg/dL (ref 1.5–2.5)

## 2017-11-02 ENCOUNTER — Other Ambulatory Visit: Payer: Self-pay | Admitting: Internal Medicine

## 2017-11-04 ENCOUNTER — Other Ambulatory Visit: Payer: Self-pay | Admitting: Internal Medicine

## 2017-11-04 MED ORDER — ALMOTRIPTAN MALATE 12.5 MG PO TABS: ORAL_TABLET | ORAL | 5 refills | Status: DC

## 2017-12-08 ENCOUNTER — Ambulatory Visit (INDEPENDENT_AMBULATORY_CARE_PROVIDER_SITE_OTHER): Payer: Medicare Other | Admitting: Adult Health

## 2017-12-08 ENCOUNTER — Encounter: Payer: Self-pay | Admitting: Adult Health

## 2017-12-08 VITALS — BP 104/60 | HR 86 | Temp 97.4°F | Ht 61.5 in | Wt 107.4 lb

## 2017-12-08 DIAGNOSIS — R300 Dysuria: Secondary | ICD-10-CM

## 2017-12-08 MED ORDER — CEFUROXIME AXETIL 250 MG PO TABS: 250.0000 mg | ORAL_TABLET | Freq: Two times a day (BID) | ORAL | 0 refills | Status: DC

## 2017-12-08 MED ORDER — BUTALBITAL-APAP-CAFFEINE 50-325-40 MG PO TABS: 1.0000 | ORAL_TABLET | Freq: Four times a day (QID) | ORAL | 0 refills | Status: AC | PRN

## 2017-12-08 MED ORDER — FLUCONAZOLE 150 MG PO TABS: 150.0000 mg | ORAL_TABLET | Freq: Once | ORAL | 1 refills | Status: AC

## 2017-12-08 NOTE — Patient Instructions (Addendum)
Urinary Tract Infection, Adult A urinary tract infection (UTI) is an infection of any part of the urinary tract, which includes the kidneys, ureters, bladder, and urethra. These organs make, store, and get rid of urine in the body. UTI can be a bladder infection (cystitis) or kidney infection (pyelonephritis). What are the causes? This infection may be caused by fungi, viruses, or bacteria. Bacteria are the most common cause of UTIs. This condition can also be caused by repeated incomplete emptying of the bladder during urination. What increases the risk? This condition is more likely to develop if:  You ignore your need to urinate or hold urine for long periods of time.  You do not empty your bladder completely during urination.  You wipe back to front after urinating or having a bowel movement, if you are female.  You are uncircumcised, if you are female.  You are constipated.  You have a urinary catheter that stays in place (indwelling).  You have a weak defense (immune) system.  You have a medical condition that affects your bowels, kidneys, or bladder.  You have diabetes.  You take antibiotic medicines frequently or for long periods of time, and the antibiotics no longer work well against certain types of infections (antibiotic resistance).  You take medicines that irritate your urinary tract.  You are exposed to chemicals that irritate your urinary tract.  You are female.  What are the signs or symptoms? Symptoms of this condition include:  Fever.  Frequent urination or passing small amounts of urine frequently.  Needing to urinate urgently.  Pain or burning with urination.  Urine that smells bad or unusual.  Cloudy urine.  Pain in the lower abdomen or back.  Trouble urinating.  Blood in the urine.  Vomiting or being less hungry than normal.  Diarrhea or abdominal pain.  Vaginal discharge, if you are female.  How is this diagnosed? This condition is  diagnosed with a medical history and physical exam. You will also need to provide a urine sample to test your urine. Other tests may be done, including:  Blood tests.  Sexually transmitted disease (STD) testing.  If you have had more than one UTI, a cystoscopy or imaging studies may be done to determine the cause of the infections. How is this treated? Treatment for this condition often includes a combination of two or more of the following:  Antibiotic medicine.  Other medicines to treat less common causes of UTI.  Over-the-counter medicines to treat pain.  Drinking enough water to stay hydrated.  Follow these instructions at home:  Take over-the-counter and prescription medicines only as told by your health care provider.  If you were prescribed an antibiotic, take it as told by your health care provider. Do not stop taking the antibiotic even if you start to feel better.  Avoid alcohol, caffeine, tea, and carbonated beverages. They can irritate your bladder.  Drink enough fluid to keep your urine clear or pale yellow.  Keep all follow-up visits as told by your health care provider. This is important.  Make sure to: ? Empty your bladder often and completely. Do not hold urine for long periods of time. ? Empty your bladder before and after sex. ? Wipe from front to back after a bowel movement if you are female. Use each tissue one time when you wipe. Contact a health care provider if:  You have back pain.  You have a fever.  You feel nauseous or vomit.  Your symptoms do not  get better after 3 days.  Your symptoms go away and then return. Get help right away if:  You have severe back pain or lower abdominal pain.  You are vomiting and cannot keep down any medicines or water. This information is not intended to replace advice given to you by your health care provider. Make sure you discuss any questions you have with your health care provider. Document Released:  03/11/2005 Document Revised: 11/13/2015 Document Reviewed: 04/22/2015 Elsevier Interactive Patient Education  2018 [employer].    Cefuroxime tablets What is this medicine? CEFUROXIME (se fyoor OX eem) is a cephalosporin antibiotic. It is used to treat certain kinds of bacterial infections. It will not work for colds, flu, or other viral infections. This medicine may be used for other purposes; ask your health care provider or pharmacist if you have questions. COMMON BRAND NAME(S): Alti-Cefuroxime, Ceftin What should I tell my health care provider before I take this medicine? They need to know if you have any of these conditions: -bleeding problems -bowel disease, like colitis -kidney disease -liver disease -an unusual or allergic reaction to cefuroxime, other antibiotics or medicines, foods, dyes or preservatives -pregnant or trying to get pregnant -breast-feeding How should I use this medicine? Take this medicine by mouth with a full glass of water. Follow the directions on the prescription label. Do not crush or chew. This medicine works best if you take it with food. Take your medicine at regular intervals. Do not take your medicine more often than directed. Take all of your medicine as directed even if you think your are better. Do not skip doses or stop your medicine early. Talk to your pediatrician regarding the use of this medicine in children. Special care may be needed. While this drug may be prescribed for children as young as 43 months of age for selected conditions, precautions do apply. Overdosage: If you think you have taken too much of this medicine contact a poison control center or emergency room at once. NOTE: This medicine is only for you. Do not share this medicine with others. What if I miss a dose? If you miss a dose, take it as soon as you can. If it is almost time for your next dose, take only that dose. Do not take double or extra doses. What may interact with  this medicine? This medicine may interact with the following medications: -antacids -birth control pills -certain medicines for infection like amikacin, gentamicin, tobramycin -diuretics -probenecid -warfarin This list may not describe all possible interactions. Give your health care provider a list of all the medicines, herbs, non-prescription drugs, or dietary supplements you use. Also tell them if you smoke, drink alcohol, or use illegal drugs. Some items may interact with your medicine. What should I watch for while using this medicine? Tell your doctor or health care professional if your symptoms do not improve or if you get new symptoms. Do not treat diarrhea with over the counter products. Contact your doctor if you have diarrhea that lasts more than 2 days or if it is severe and watery. This medicine can interfere with some urine glucose tests. If you use such tests, talk with your health care professional. If you are being treated for a sexually transmitted disease, avoid sexual contact until you have finished your treatment. Your sexual partner may also need treatment. What side effects may I notice from receiving this medicine? Side effects that you should report to your doctor or health care professional as soon as  possible: -allergic reactions like skin rash, itching or hives, swelling of the face, lips, or tongue -dark urine -difficulty breathing -fever -irregular heartbeat or chest pain -redness, blistering, peeling or loosening of the skin, including inside the mouth -seizures -unusual bleeding or bruising -unusually weak or tired -white patches or sores in the mouth Side effects that usually do not require medical attention (report to your doctor or health care professional if they continue or are bothersome): -diarrhea -gas or heartburn -headache -nausea, vomiting -vaginal itching This list may not describe all possible side effects. Call your doctor for medical  advice about side effects. You may report side effects to FDA at 1-800-FDA-1088. Where should I keep my medicine? Keep out of the reach of children. Store at room temperature between 15 and 30 degrees C (59 and 86 degrees F). Keep container tightly closed. Protect from moisture. Throw away any unused medicine after the expiration date. NOTE: This sheet is a summary. It may not cover all possible information. If you have questions about this medicine, talk to your doctor, pharmacist, or health care provider.  2018 Elsevier/Gold Standard (2013-04-17 09:43:18)  Acetaminophen; Butalbital; Caffeine tablets or capsules What is this medicine? ACETAMINOPHEN; BUTALBITAL; CAFFEINE (a set a MEE noe fen; byoo TAL bi tal; KAF een) is a pain reliever. It is used to treat tension headaches. This medicine may be used for other purposes; ask your health care provider or pharmacist if you have questions. COMMON BRAND NAME(S): Alagesic, Americet, Anolor-300, Arcet, BAC, CAPACET, Dolgic Plus, Esgic, Esgic Plus, Ezol, Fioricet, Ryder System, Medigesic, [place], [address] [place], Phrenilin Forte, Repan, [place], Triad, Zebutal What should I tell my health care provider before I take this medicine? They need to know if you have any of these conditions: -drug abuse or addiction -heart or circulation problems -if you often drink alcohol -kidney disease or problems going to the bathroom -liver disease -lung disease, asthma, or breathing problems -porphyria -an unusual or allergic reaction to acetaminophen, butalbital or other barbiturates, caffeine, other medicines, foods, dyes, or preservatives -pregnant or trying to get pregnant -breast-feeding How should I use this medicine? Take this medicine by mouth with a full glass of water. Follow the directions on the prescription label. If the medicine upsets your stomach, take the medicine with food or milk. Do not take more than you are told to take. Talk to your pediatrician  regarding the use of this medicine in children. Special care may be needed. Overdosage: If you think you have taken too much of this medicine contact a poison control center or emergency room at once. NOTE: This medicine is only for you. Do not share this medicine with others. What if I miss a dose? If you miss a dose, take it as soon as you can. If it is almost time for your next dose, take only that dose. Do not take double or extra doses. What may interact with this medicine? -alcohol or medicines that contain alcohol -antidepressants, especially MAOIs like isocarboxazid, phenelzine, tranylcypromine, and selegiline -antihistamines -benzodiazepines -carbamazepine -isoniazid -medicines for pain like pentazocine, buprenorphine, butorphanol, nalbuphine, tramadol, and propoxyphene -muscle relaxants -naltrexone -phenobarbital, phenytoin, and fosphenytoin -phenothiazines like perphenazine, thioridazine, chlorpromazine, mesoridazine, fluphenazine, prochlorperazine, promazine, and trifluoperazine -voriconazole This list may not describe all possible interactions. Give your health care provider a list of all the medicines, herbs, non-prescription drugs, or dietary supplements you use. Also tell them if you smoke, drink alcohol, or use illegal drugs. Some items may interact with your medicine. What should I watch for  while using this medicine? Tell your doctor or health care professional if your pain does not go away, if it gets worse, or if you have new or a different type of pain. You may develop tolerance to the medicine. Tolerance means that you will need a higher dose of the medicine for pain relief. Tolerance is normal and is expected if you take the medicine for a long time. Do not suddenly stop taking your medicine because you may develop a severe reaction. Your body becomes used to the medicine. This does NOT mean you are addicted. Addiction is a behavior related to getting and using a drug for  a non-medical reason. If you have pain, you have a medical reason to take pain medicine. Your doctor will tell you how much medicine to take. If your doctor wants you to stop the medicine, the dose will be slowly lowered over time to avoid any side effects. You may get drowsy or dizzy when you first start taking the medicine or change doses. Do not drive, use machinery, or do anything that may be dangerous until you know how the medicine affects you. Stand or sit up slowly. Do not take other medicines that contain acetaminophen with this medicine. Always read labels carefully. If you have questions, ask your doctor or pharmacist. If you take too much acetaminophen get medical help right away. Too much acetaminophen can be very dangerous and cause liver damage. Even if you do not have symptoms, it is important to get help right away. What side effects may I notice from receiving this medicine? Side effects that you should report to your doctor or health care professional as soon as possible: -allergic reactions like skin rash, itching or hives, swelling of the face, lips, or tongue -breathing problems -confusion -feeling faint or lightheaded, falls -redness, blistering, peeling or loosening of the skin, including inside the mouth -seizure -stomach pain -yellowing of the eyes or skin Side effects that usually do not require medical attention (report to your doctor or health care professional if they continue or are bothersome): -constipation -nausea, vomiting This list may not describe all possible side effects. Call your doctor for medical advice about side effects. You may report side effects to FDA at 1-800-FDA-1088. Where should I keep my medicine? Keep out of the reach of children. This medicine can be abused. Keep your medicine in a safe place to protect it from theft. Do not share this medicine with anyone. Selling or giving away this medicine is dangerous and against the law. This medicine  may cause accidental overdose and death if it taken by other adults, children, or pets. Mix any unused medicine with a substance like cat litter or coffee grounds. Then throw the medicine away in a sealed container like a sealed bag or a coffee can with a lid. Do not use the medicine after the expiration date. Store at room temperature between 15 and 30 degrees C (59 and 86 degrees F). NOTE: This sheet is a summary. It may not cover all possible information. If you have questions about this medicine, talk to your doctor, pharmacist, or health care provider.  2018 Elsevier/Gold Standard (2013-07-28 15:00:25)

## 2017-12-08 NOTE — Progress Notes (Signed)
Assessment and Plan:  Alexis Weaver was seen today for urinary tract infection.  Diagnoses and all orders for this visit:  Dysuria Maintain adequate hydration, stop epsom salt baths, ? Dual UTI/fungal - adjust treatment pending results of UA/culture Follow up if symptoms not improving, and as needed. -     Urinalysis w microscopic + reflex cultur -     cefUROXime (CEFTIN) 250 MG tablet; Take 1 tablet (250 mg total) by mouth 2 (two) times daily. -     fluconazole (DIFLUCAN) 150 MG tablet; Take 1 tablet (150 mg total) by mouth once for 1 dose  Further disposition pending results of labs. Discussed med's effects and SE's.   Over 15 minutes of exam, counseling, chart review, and critical decision making was performed.   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 Does have some unusual perineal "irritation" without rash - denies vaginal discharge or pelvic pain. No new sexual partners (not currently active), no hx of STI. Denies fever. Doesn't douche but admits to frequent scented epsom salt baths recently.   Alexis Weaver does have hx of occasional UTIs - most recent in 03/2017 and was treated by septra DS  Alexis Weaver also endorses hx of yeast infections  Past Medical History:  Diagnosis Date  . Allergy   . Anemia 2016  . Anxiety   . Asthma   . Chronic headaches   . Chronic sinusitis   . Depression   . Fibromyalgia   . GERD (gastroesophageal reflux  disease)   . IBS (irritable bowel syndrome)   . Meckel diverticulum 1997  . Meckel's diverticulum   . Neuromuscular disorder (HCC)    fibromyagia  . Osteopenia      Allergies  Allergen Reactions  . Hydrocodone-Acetaminophen Shortness Of Breath  . Codeine Hives  . Morphine And Related Hives  . Nitrofurantoin Other (See Comments)    Makes hands and feet burn    Current Outpatient Medications on File Prior to Visit  Medication Sig  . ADVAIR DISKUS 250-50 MCG/DOSE AEPB inhale 1 puff by mouth twice a day if needed for shortness of breath  . almotriptan (AXERT) 12.5 MG tablet - Use stat for Migraine & may repeat 1 x / day in 2 hours if needed  . AMITIZA 24 MCG capsule take 1 capsule by mouth twice a day  . cycloSPORINE (RESTASIS) 0.05 % ophthalmic emulsion Place 1 drop into both eyes 2 (two) times daily.    Marland Kitchen dextroamphetamine (DEXEDRINE SPANSULE) 10 MG 24 hr capsule Take 10 mg by mouth daily.   Marland Kitchen docusate sodium (COLACE) 100 MG capsule Take 100 mg by mouth as needed for mild constipation.  Marland Kitchen estradiol (VIVELLE-DOT) 0.05 MG/24HR patch UNWRAP AND APPLY 1 PATCH TO TWICE A WEEK  . fluticasone (FLONASE) 50 MCG/ACT nasal spray instill 2 sprays into each nostril once daily  . LATISSE 0.03 % ophthalmic solution place 1 drop ON APPLICATOR AND APPLY EVENLY ALONG SKIN OF UPPER EYELIDS AT BASE OF EYELASHES  once daily at bedtime  . loratadine (CLARITIN) 10 MG tablet Take 10 mg by mouth daily.  . magnesium gluconate (MAGONATE) 500 MG tablet Take 500 mg by mouth daily.  . montelukast (SINGULAIR) 10 MG tablet TAKE 1 TABLET BY MOUTH ONCE DAILY  . ondansetron (ZOFRAN) 8 MG tablet take 1 tablet by mouth every 8 hours if needed for nausea and vomiting  . predniSONE (DELTASONE) 10 MG tablet 1 tablet daily for 5 days, once a month  . RELPAX 40 MG tablet take 1 tablet by mouth immediately if needed for headache .  Marland Kitchen. SAVELLA 100 MG TABS tablet take 1 to 2 tablets by mouth once daily as directed for MOOD and  depression  . Sennosides (SENOKOT PO) Take 1 tablet by mouth daily.  . traZODone (DESYREL) 50 MG tablet Take 25 mg by mouth at bedtime.  . triamterene-hydrochlorothiazide (MAXZIDE) 75-50 MG tablet take 1 tablet by mouth once daily for high blood pressure and fluid retention  . Cholecalciferol (VITAMIN D PO) Take 5,000 Units by mouth daily.   No current facility-administered medications on file prior to visit.     ROS: all negative except above.   Physical Exam:  BP 104/60   Pulse 86   Temp (!) 97.4 F (36.3 C)   Ht 5' 1.5" (1.562 m)   Wt 107 lb 6.4 oz (48.7 kg)   SpO2 96%   BMI 19.96 kg/m   General Appearance: Well nourished, in no apparent distress. Eyes: PERRLA,  conjunctiva no swelling or erythema ENT/Mouth:  Hearing normal.  Neck: Supple.  Respiratory: Respiratory effort normal, BS equal bilaterally without rales, rhonchi, wheezing or stridor.  Cardio: RRR with no MRGs. Brisk peripheral pulses without edema.  Abdomen: Soft, + BS.  Mild suprapubic tenderness without bladder distension, no guarding, rebound, hernias, masses. Lymphatics: Non tender without lymphadenopathy.  Musculoskeletal: Symmetrical strength, normal gait. No CVA tenderness  Skin: Warm, dry without rashes, lesions, ecchymosis.  Psych: Awake and oriented X 3, normal affect, Insight and Judgment appropriate.     Dan MakerAshley C Ahleah Simko, NP 4:16 PM [facility]

## 2017-12-09 LAB — URINALYSIS W MICROSCOPIC + REFLEX CULTURE
Bacteria, UA: NONE SEEN /HPF
Bilirubin Urine: NEGATIVE
Glucose, UA: NEGATIVE
HGB URINE DIPSTICK: NEGATIVE
HYALINE CAST: NONE SEEN /LPF
Ketones, ur: NEGATIVE
Leukocyte Esterase: NEGATIVE
Nitrites, Initial: NEGATIVE
PH: 6 (ref 5.0–8.0)
PROTEIN: NEGATIVE
RBC / HPF: NONE SEEN /HPF (ref 0–2)
SQUAMOUS EPITHELIAL / LPF: NONE SEEN /HPF (ref <=5)
Specific Gravity, Urine: 1.014 (ref 1.001–1.03)
WBC UA: NONE SEEN /HPF (ref 0–5)

## 2017-12-09 LAB — NO CULTURE INDICATED

## 2017-12-12 ENCOUNTER — Encounter: Payer: Self-pay | Admitting: Adult Health

## 2017-12-21 ENCOUNTER — Other Ambulatory Visit: Payer: Self-pay | Admitting: Internal Medicine

## 2017-12-22 ENCOUNTER — Encounter: Payer: Self-pay | Admitting: Internal Medicine

## 2017-12-22 ENCOUNTER — Other Ambulatory Visit: Payer: Self-pay | Admitting: Internal Medicine

## 2017-12-22 DIAGNOSIS — K581 Irritable bowel syndrome with constipation: Secondary | ICD-10-CM

## 2017-12-22 MED ORDER — LINACLOTIDE 72 MCG PO CAPS: ORAL_CAPSULE | ORAL | 3 refills | Status: DC

## 2017-12-31 ENCOUNTER — Other Ambulatory Visit: Payer: Self-pay | Admitting: Physician Assistant

## 2018-01-03 ENCOUNTER — Telehealth: Payer: Self-pay | Admitting: Adult Health

## 2018-01-03 NOTE — Telephone Encounter (Signed)
Patient called to request, [facility]. Scheduled for mammo and DEXA 01-07-18. [facility] and Patient aware.

## 2018-01-05 ENCOUNTER — Encounter (INDEPENDENT_AMBULATORY_CARE_PROVIDER_SITE_OTHER): Payer: Self-pay

## 2018-01-12 LAB — HM DEXA SCAN

## 2018-01-12 LAB — HM MAMMOGRAPHY

## 2018-01-19 ENCOUNTER — Encounter: Payer: Self-pay | Admitting: *Deleted

## 2018-01-20 ENCOUNTER — Encounter: Payer: Self-pay | Admitting: *Deleted

## 2018-01-25 ENCOUNTER — Other Ambulatory Visit: Payer: Self-pay | Admitting: Internal Medicine

## 2018-02-24 ENCOUNTER — Other Ambulatory Visit: Payer: Self-pay | Admitting: Adult Health

## 2018-02-24 NOTE — Progress Notes (Signed)
CPE  Assessment:   Prediabetes Discussed general issues about diabetes pathophysiology and management., Educational material distributed., Suggested low cholesterol diet., Encouraged aerobic exercise., Discussed foot care., Reminded to get yearly retinal exam.  Osteopenia Due 2 years, add on weight bearing exercises  Chronic maxillary sinusitis monitor   Vitamin D deficiency - Vit D  25 hydroxy (rtn osteoporosis monitoring)  Encounter for long-term (current) use of medications - CBC with Differential/Platelet - BASIC METABOLIC PANEL WITH GFR - Hepatic function panel - Magnesium  Major Depression in partial remission Follow up Dr. Lyanne Co, no SI/HI - TSH   Anxiety Follow up Dr. Lyanne Co   IBS (irritable bowel syndrome)-constipation type Continue linzess doing well  Asthma, chronic, unspecified asthma severity, uncomplicated Continue medications  Screening cholesterol level -     Lipid panel  Acute vaginitis Likely vaginal atrophy will rule out infection -     WET PREP BY MOLECULAR PROBE -     HSV(herpes simplex vrs) 1+2 ab-IgG  Urinary frequency -     Urine Culture   Anemia, unspecified anemia type Check CBC    Over 30 minutes of exam, counseling, chart review and critical decision making was performed Future Appointments  Date Time Provider Department Center  03/08/2019 10:00 AM Quentin Mulling, PA-C [facility] None      Subjective:  Alexis Weaver is a 67 y.o. female who presents for CPE and follow up  Has allergy to monstat, using OTC meds and diflucan, she was self treated for UTI but culture came back negative. Still having symtpoms. She has vaginal dryness.   She bought a new house, has increase stress with this and her dog is not doing well.   Her blood pressure has been controlled at home, today their BP is BP: 118/76 She does workout. She denies chest pain, shortness of breath, dizziness.   She is not on cholesterol medication and denies myalgias.  Her cholesterol is at goal. The cholesterol last visit was:   Lab Results  Component Value Date   CHOL 208 (H) 10/26/2017   HDL 100 10/26/2017   LDLCALC 94 10/26/2017   TRIG 62 10/26/2017   CHOLHDL 2.1 10/26/2017   Last Z6X  Lab Results  Component Value Date   HGBA1C 5.8 (H) 10/26/2017   Lab Results  Component Value Date   GFRNONAA 65 10/26/2017   Patient is on Vitamin D supplement.   Lab Results  Component Value Date   VD25OH 66 10/26/2017     She has had negative ANA, AntiDNA, RF. 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  She is on estrogen therapy and on bASA.  She sees a therapist and Dr. Evelene Croon. She states she is taking xanax just at night for sleep.  Sees Dr. Ezzard Standing and Prescott Callas for allergies/asthma. She is on prednisone very rarely for allergies.   BMI is Body mass index is 20.33 kg/m., she is working on diet and exercise. Wt Readings from Last 3 Encounters:  03/01/18 107 lb 9.6 oz (48.8 kg)  12/08/17 107 lb 6.4 oz (48.7 kg)  10/26/17 106 lb 9.6 oz (48.4 kg)     Medication Review: Current Outpatient Medications on File Prior to Visit  Medication Sig Dispense Refill  . ADVAIR DISKUS 250-50 MCG/DOSE AEPB inhale 1 puff by mouth twice a day if needed for shortness of breath 60 each 11  . almotriptan (AXERT) 12.5 MG tablet - Use stat for Migraine & may repeat 1 x /  day in 2 hours if needed 12 tablet 5  . butalbital-acetaminophen-caffeine (FIORICET, ESGIC) 50-325-40 MG tablet Take 1-2 tablets by mouth every 6 (six) hours as needed for headache. 20 tablet 0  . cycloSPORINE (RESTASIS) 0.05 % ophthalmic emulsion Place 1 drop into both eyes 2 (two) times daily.      Marland Kitchen dextroamphetamine (DEXEDRINE SPANSULE) 10 MG 24 hr capsule Take 10 mg by mouth daily.   0  . estradiol (VIVELLE-DOT) 0.05 MG/24HR patch UNWRAP AND APPLY 1 PATCH TO TWICE A WEEK 24 patch 3  . fluticasone (FLONASE) 50 MCG/ACT nasal spray INSTILL 2 SPRAYS INTO EACH NOSTRIL ONCE DAILY  48 g 0  . LATISSE 0.03 % ophthalmic solution place 1 drop ON APPLICATOR AND APPLY EVENLY ALONG SKIN OF UPPER EYELIDS AT BASE OF EYELASHES once daily at bedtime 3 mL 11  . linaclotide (LINZESS) 72 MCG capsule Take 1 capsule daily for IBS-C                                                                             m 90 capsule 3  . loratadine (CLARITIN) 10 MG tablet Take 10 mg by mouth daily.    . magnesium gluconate (MAGONATE) 500 MG tablet Take 500 mg by mouth daily.    . montelukast (SINGULAIR) 10 MG tablet TAKE 1 TABLET BY MOUTH ONCE DAILY 90 tablet 0  . ondansetron (ZOFRAN) 8 MG tablet take 1 tablet by mouth every 8 hours if needed for nausea and vomiting 100 tablet 3  . RELPAX 40 MG tablet take 1 tablet by mouth immediately if needed for headache . 6 tablet 5  . SAVELLA 100 MG TABS tablet TAKE 1 TO 2 TABLETS BY MOUTH ONCE DAILY FOR MOOD AND DEPRESSION AS DIRECTED 60 tablet 0  . Sennosides (SENOKOT PO) Take 1 tablet by mouth daily.    . traZODone (DESYREL) 50 MG tablet Take 25 mg by mouth at bedtime.    . triamterene-hydrochlorothiazide (MAXZIDE) 75-50 MG tablet TAKE 1 TABLET BY MOUTH ONCE DAILY FOR HIGH BLOOD PRESSURE AND FLUID RETENTION 90 tablet 0   No current facility-administered medications on file prior to visit.     Allergies  Allergen Reactions  . Hydrocodone-Acetaminophen Shortness Of Breath  . Codeine Hives  . Morphine And Related Hives  . Nitrofurantoin Other (See Comments)    Makes hands and feet burn    Current Problems (verified) Patient Active Problem List   Diagnosis Date Noted  . 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 Anemia 07/21/2015  . Prediabetes 06/26/2014  . Medication management 06/26/2014  . IBS (irritable bowel syndrome)-constipation type 06/26/2014  . Bilateral shoulder pain 06/26/2014  . Vitamin D deficiency 06/26/2014  . Asthma, chronic   . Chronic sinusitis   . Anxiety   . Major depression in partial remission (HCC)   . Osteopenia   . Palpitations  04/01/2010    Screening Tests Immunization History  Administered Date(s) Administered  . DT 11/06/2004  . Influenza-Unspecified 02/14/2012  . Pneumococcal Conjugate-13 10/26/2017  . Tdap 02/26/2011   Tetanus: 2012 Pneumovax: will get 1 year Prevnar 13: DUE Flu vaccine: declines Zostavax: N/A  Pap: 2016, declines another,never abnormal pap MGM: 12/2017 [facility] DEXA: 12/2017 -2.3 Colonoscopy:  2017  Dr. Russella DarStark EGD: 10/2015 MRI brain 2010 CXR 2015  Names of Other Physician/Practitioners you currently use: 1. [facility] here for primary care 2. [place], eye doctor, last visit 2018 3. How[place], dentist, last visit 2018 Patient Care Team: Lucky CowboyMcKeown, William, MD as PCP - General (Internal Medicine) Sidney AceSharma, Ranjan, MD as Referring Physician (Allergy) Marcene Corningalldorf, Peter, MD as Consulting Physician (Orthopedic Surgery)  SURGICAL HISTORY She  has a past surgical history that includes Abdominal hysterectomy; Cesarean section (1979, 1983); Breast enhancement surgery; Laparoscopic abdominal exploration; Hemorrhoid banding; and Appendectomy (1997). FAMILY HISTORY Her family history includes Asthma in her brother; COPD in her brother; Diabetes in her brother and father; Diverticulitis in her maternal grandmother; Hyperlipidemia in her brother; Hypertension in her father and mother; Rheum arthritis in her sister. SOCIAL HISTORY She  reports that she has never smoked. She has never used smokeless tobacco. She reports that she does not drink alcohol or use drugs.    Review of Systems  Constitutional: Positive for malaise/fatigue. Negative for chills, diaphoresis, fever and weight loss.  HENT: Negative for congestion, ear discharge, ear pain, hearing loss, nosebleeds, sore throat and tinnitus.   Eyes: Negative.   Respiratory: Negative for cough, hemoptysis, sputum production, shortness of breath, wheezing and stridor.   Cardiovascular: Negative.    Gastrointestinal: Positive for constipation (on amitiza) and heartburn (better on nexium). Negative for abdominal pain, blood in stool, diarrhea, melena, nausea and vomiting.  Genitourinary: Positive for frequency. Negative for dysuria, flank pain, hematuria and urgency.  Musculoskeletal: Positive for myalgias. Negative for back pain, falls, joint pain and neck pain.  Skin: Negative.   Neurological: Negative.  Negative for weakness and headaches.  Psychiatric/Behavioral: Positive for depression. Negative for hallucinations, memory loss, substance abuse and suicidal ideas. The patient is not nervous/anxious.      Objective:     Today's Vitals   03/01/18 0957  BP: 118/76  Pulse: 82  Resp: 16  Temp: 98 F (36.7 C)  SpO2: 99%  Weight: 107 lb 9.6 oz (48.8 kg)  Height: 5\' 1"  (1.549 m)   Body mass index is 20.33 kg/m.  General appearance: alert, no distress, WD/WN, female HEENT: normocephalic, sclerae anicteric, TMs pearly, nares patent, no discharge or erythema, pharynx normal Oral cavity: MMM, no lesions Neck: supple, no lymphadenopathy, no thyromegaly, no masses Heart: RRR, normal S1, S2, no murmurs Lungs: CTA bilaterally, no wheezes, rhonchi, or rales Abdomen: +bs, soft, non tender, non distended, no masses, no hepatomegaly, no splenomegaly Musculoskeletal: nontender, no swelling, no obvious deformity. Extremities: no edema, no cyanosis, no clubbing Pulses: 2+ symmetric, upper and lower extremities, normal cap refill Neurological: alert, oriented x 3, CN2-12 intact, strength normal upper extremities and lower extremities, sensation normal throughout, DTRs 2+ throughout, no cerebellar signs, gait normal Psychiatric: normal affect, behavior normal, pleasant   GYN VULVA: vulvar edema , vulvar erythema and atrophy.    Quentin Mullingmanda Marvelyn Bouchillon, PA-C   03/01/2018

## 2018-03-01 ENCOUNTER — Encounter: Payer: Self-pay | Admitting: Physician Assistant

## 2018-03-01 ENCOUNTER — Ambulatory Visit (INDEPENDENT_AMBULATORY_CARE_PROVIDER_SITE_OTHER): Payer: Medicare Other | Admitting: Physician Assistant

## 2018-03-01 VITALS — BP 118/76 | HR 82 | Temp 98.0°F | Resp 16 | Ht 61.0 in | Wt 107.6 lb

## 2018-03-01 DIAGNOSIS — J45909 Unspecified asthma, uncomplicated: Secondary | ICD-10-CM

## 2018-03-01 DIAGNOSIS — R0989 Other specified symptoms and signs involving the circulatory and respiratory systems: Secondary | ICD-10-CM

## 2018-03-01 DIAGNOSIS — Z0000 Encounter for general adult medical examination without abnormal findings: Secondary | ICD-10-CM

## 2018-03-01 DIAGNOSIS — Z136 Encounter for screening for cardiovascular disorders: Secondary | ICD-10-CM

## 2018-03-01 DIAGNOSIS — F419 Anxiety disorder, unspecified: Secondary | ICD-10-CM

## 2018-03-01 DIAGNOSIS — M8580 Other specified disorders of bone density and structure, unspecified site: Secondary | ICD-10-CM

## 2018-03-01 DIAGNOSIS — R350 Frequency of micturition: Secondary | ICD-10-CM

## 2018-03-01 DIAGNOSIS — Z79899 Other long term (current) drug therapy: Secondary | ICD-10-CM

## 2018-03-01 DIAGNOSIS — E559 Vitamin D deficiency, unspecified: Secondary | ICD-10-CM

## 2018-03-01 DIAGNOSIS — R002 Palpitations: Secondary | ICD-10-CM

## 2018-03-01 DIAGNOSIS — I10 Essential (primary) hypertension: Secondary | ICD-10-CM | POA: Diagnosis not present

## 2018-03-01 DIAGNOSIS — F3341 Major depressive disorder, recurrent, in partial remission: Secondary | ICD-10-CM

## 2018-03-01 DIAGNOSIS — Z13220 Encounter for screening for lipoid disorders: Secondary | ICD-10-CM

## 2018-03-01 DIAGNOSIS — M25512 Pain in left shoulder: Secondary | ICD-10-CM

## 2018-03-01 DIAGNOSIS — J320 Chronic maxillary sinusitis: Secondary | ICD-10-CM

## 2018-03-01 DIAGNOSIS — D649 Anemia, unspecified: Secondary | ICD-10-CM

## 2018-03-01 DIAGNOSIS — K589 Irritable bowel syndrome without diarrhea: Secondary | ICD-10-CM

## 2018-03-01 DIAGNOSIS — M25511 Pain in right shoulder: Secondary | ICD-10-CM

## 2018-03-01 DIAGNOSIS — R7303 Prediabetes: Secondary | ICD-10-CM

## 2018-03-01 DIAGNOSIS — N760 Acute vaginitis: Secondary | ICD-10-CM

## 2018-03-02 ENCOUNTER — Other Ambulatory Visit: Payer: Self-pay | Admitting: Physician Assistant

## 2018-03-02 LAB — COMPLETE METABOLIC PANEL WITH GFR
AG RATIO: 2 (calc) (ref 1.0–2.5)
ALKALINE PHOSPHATASE (APISO): 53 U/L (ref 33–130)
ALT: 19 U/L (ref 6–29)
AST: 25 U/L (ref 10–35)
Albumin: 4.3 g/dL (ref 3.6–5.1)
BUN: 20 mg/dL (ref 7–25)
CO2: 31 mmol/L (ref 20–32)
Calcium: 9.1 mg/dL (ref 8.6–10.4)
Chloride: 99 mmol/L (ref 98–110)
Creat: 0.73 mg/dL (ref 0.50–0.99)
GFR, Est African American: 99 mL/min/{1.73_m2} (ref >=60)
GFR, Est Non African American: 86 mL/min/{1.73_m2} (ref >=60)
GLOBULIN: 2.1 g/dL (ref 1.9–3.7)
GLUCOSE: 84 mg/dL (ref 65–99)
POTASSIUM: 3.6 mmol/L (ref 3.5–5.3)
SODIUM: 137 mmol/L (ref 135–146)
Total Bilirubin: 0.6 mg/dL (ref 0.2–1.2)
Total Protein: 6.4 g/dL (ref 6.1–8.1)

## 2018-03-02 LAB — CBC WITH DIFFERENTIAL/PLATELET
BASOS ABS: 68 {cells}/uL (ref 0–200)
BASOS PCT: 1.5 %
EOS ABS: 81 {cells}/uL (ref 15–500)
Eosinophils Relative: 1.8 %
HCT: 37.7 % (ref 35.0–45.0)
Hemoglobin: 12 g/dL (ref 11.7–15.5)
Lymphs Abs: 1647 cells/uL (ref 850–3900)
MCH: 27.6 pg (ref 27.0–33.0)
MCHC: 31.8 g/dL — AB (ref 32.0–36.0)
MCV: 86.7 fL (ref 80.0–100.0)
MONOS PCT: 8.6 %
MPV: 10.7 fL (ref 7.5–12.5)
NEUTROS PCT: 51.5 %
Neutro Abs: 2318 cells/uL (ref 1500–7800)
PLATELETS: 271 10*3/uL (ref 140–400)
RBC: 4.35 10*6/uL (ref 3.80–5.10)
RDW: 14.2 % (ref 11.0–15.0)
TOTAL LYMPHOCYTE: 36.6 %
WBC mixed population: 387 cells/uL (ref 200–950)
WBC: 4.5 10*3/uL (ref 3.8–10.8)

## 2018-03-02 LAB — WET PREP BY MOLECULAR PROBE
Candida species: NOT DETECTED
Gardnerella vaginalis: NOT DETECTED
MICRO NUMBER:: 91115624
SOURCE: 0
SPECIMEN QUALITY:: ADEQUATE
Trichomonas vaginosis: NOT DETECTED

## 2018-03-02 LAB — URINALYSIS, ROUTINE W REFLEX MICROSCOPIC
Bilirubin Urine: NEGATIVE
Glucose, UA: NEGATIVE
Hgb urine dipstick: NEGATIVE
KETONES UR: NEGATIVE
LEUKOCYTES UA: NEGATIVE
NITRITE: NEGATIVE
PH: 6.5 (ref 5.0–8.0)
Protein, ur: NEGATIVE
Specific Gravity, Urine: 1.023 (ref 1.001–1.03)

## 2018-03-02 LAB — LIPID PANEL
CHOLESTEROL: 203 mg/dL — AB (ref <200)
HDL: 95 mg/dL (ref >50)
LDL Cholesterol (Calc): 96 mg/dL (calc)
Non-HDL Cholesterol (Calc): 108 mg/dL (calc) (ref <130)
Total CHOL/HDL Ratio: 2.1 (calc) (ref <5.0)
Triglycerides: 42 mg/dL (ref <150)

## 2018-03-02 LAB — HSV(HERPES SIMPLEX VRS) I + II AB-IGG
HAV 1 IGG,TYPE SPECIFIC AB: <0.9 index
HSV 2 IGG,TYPE SPECIFIC AB: <0.9 index

## 2018-03-02 LAB — URINE CULTURE
MICRO NUMBER: 91115625
SPECIMEN QUALITY:: ADEQUATE

## 2018-03-02 LAB — HEMOGLOBIN A1C
Hgb A1c MFr Bld: 5.7 % of total Hgb — ABNORMAL HIGH (ref <5.7)
MEAN PLASMA GLUCOSE: 117 (calc)
eAG (mmol/L): 6.5 (calc)

## 2018-03-02 LAB — MAGNESIUM: Magnesium: 1.7 mg/dL (ref 1.5–2.5)

## 2018-03-02 LAB — MICROALBUMIN / CREATININE URINE RATIO
CREATININE, URINE: 120 mg/dL (ref 20–275)
MICROALB UR: 0.2 mg/dL
Microalb Creat Ratio: 2 mcg/mg creat (ref <30)

## 2018-03-02 LAB — IRON, TOTAL/TOTAL IRON BINDING CAP
%SAT: 21 % (ref 16–45)
Iron: 87 ug/dL (ref 45–160)
TIBC: 418 mcg/dL (calc) (ref 250–450)

## 2018-03-02 LAB — TSH: TSH: 1.6 m[IU]/L (ref 0.40–4.50)

## 2018-03-02 LAB — VITAMIN D 25 HYDROXY (VIT D DEFICIENCY, FRACTURES): VIT D 25 HYDROXY: 90 ng/mL (ref 30–100)

## 2018-03-02 LAB — INSULIN, RANDOM: Insulin: <1 u[IU]/mL — ABNORMAL LOW (ref 2.0–19.6)

## 2018-03-02 MED ORDER — TRIAMCINOLONE ACETONIDE 0.1 % EX OINT: 1.0000 "application " | TOPICAL_OINTMENT | Freq: Two times a day (BID) | CUTANEOUS | 1 refills | Status: DC

## 2018-03-02 MED ORDER — ESTROGENS, CONJUGATED 0.625 MG/GM VA CREA: 1.0000 | TOPICAL_CREAM | Freq: Every day | VAGINAL | 12 refills | Status: DC

## 2018-03-02 MED ORDER — METRONIDAZOLE 0.75 % VA GEL: 1.0000 | Freq: Every day | VAGINAL | 0 refills | Status: DC

## 2018-03-02 NOTE — Addendum Note (Signed)
Addended by: Quentin MullingOLLIER, Mariamawit Depaoli R on: 03/02/2018 06:00 AM   Modules accepted: Orders

## 2018-03-23 ENCOUNTER — Other Ambulatory Visit: Payer: Self-pay | Admitting: Physician Assistant

## 2018-04-18 ENCOUNTER — Other Ambulatory Visit: Payer: Self-pay | Admitting: Internal Medicine

## 2018-04-24 ENCOUNTER — Other Ambulatory Visit: Payer: Self-pay | Admitting: Adult Health

## 2018-07-10 ENCOUNTER — Other Ambulatory Visit: Payer: Self-pay | Admitting: Internal Medicine

## 2018-07-10 DIAGNOSIS — E348 Other specified endocrine disorders: Secondary | ICD-10-CM

## 2018-07-10 MED ORDER — ESTRADIOL 0.05 MG/24HR TD PTTW: MEDICATED_PATCH | TRANSDERMAL | 3 refills | Status: DC

## 2018-07-25 ENCOUNTER — Other Ambulatory Visit: Payer: Self-pay | Admitting: Internal Medicine

## 2018-07-25 MED ORDER — ONDANSETRON HCL 8 MG PO TABS: ORAL_TABLET | ORAL | 0 refills | Status: DC

## 2018-08-03 ENCOUNTER — Ambulatory Visit (INDEPENDENT_AMBULATORY_CARE_PROVIDER_SITE_OTHER): Payer: Medicare Other | Admitting: Adult Health

## 2018-08-03 ENCOUNTER — Encounter: Payer: Self-pay | Admitting: Adult Health

## 2018-08-03 VITALS — BP 110/76 | HR 72 | Temp 97.7°F | Ht 61.0 in | Wt 108.0 lb

## 2018-08-03 DIAGNOSIS — R238 Other skin changes: Secondary | ICD-10-CM | POA: Diagnosis not present

## 2018-08-03 DIAGNOSIS — R0989 Other specified symptoms and signs involving the circulatory and respiratory systems: Secondary | ICD-10-CM | POA: Diagnosis not present

## 2018-08-03 NOTE — Progress Notes (Signed)
Assessment and Plan:  Hadar was seen today for numbness and skin discoloration.  Diagnoses and all orders for this visit:  Diminished pulses in lower extremity/Dusky feet Low risk, no sclerodactyly, non-smoker, low BP, low cholesterol Likely raynaud's, discussed, she is very anxious so will proceed with ABI Defer lab workup for now but consider at future OV; has had neg ANA in past Follow up as needed  -     VAS [place] ABI WITH/WO TBI; Future  Further disposition pending results of labs. Discussed med's effects and SE's.   Over 15 minutes of exam, counseling, chart review, and critical decision making was performed.   Future Appointments  Date Time Provider Department Center  08/30/2018 10:30 AM [name], MD [facility] None  03/08/2019 10:00 AM Quentin Mulling, PA-C [facility] None  03/14/2019 11:15 AM Quentin Mulling, PA-C [facility] None    ------------------------------------------------------------------------------------------------------------------   HPI BP 110/76   Pulse 72   Temp 97.7 F (36.5 C)   Ht 5\' 1"  (1.549 m)   Wt 108 lb (49 kg)   SpO2 97%   BMI 20.41 kg/m   68 y.o.female with hx of migraines, anxiety, and fibromyalgia presents for evaluation of intermittently cold and blue/white/purple fingers and toes. She reports this has been intermittently ongoing for the past 6 months. She is concerned due to intermittent numbness of L 3rd toe. She does report she has been under an unusual amount of stress recently. She does not smoke. She has not noted any wounds of feet or toes. She presents with photo demonstrating fingers that are red/white/blue. She has no personal or family history of Raynaud's.    Past Medical History:  Diagnosis Date  . Allergy   . Anemia 2016  . Anxiety   . Asthma   . Chronic headaches   . Chronic sinusitis   . Depression   . Fibromyalgia   . GERD (gastroesophageal reflux disease)   . IBS (irritable bowel syndrome)   . Meckel  diverticulum 1997  . Meckel's diverticulum   . Neuromuscular disorder (HCC)    fibromyagia  . Osteopenia      Allergies  Allergen Reactions  . Hydrocodone-Acetaminophen Shortness Of Breath  . Codeine Hives  . Morphine And Related Hives  . Nitrofurantoin Other (See Comments)    Makes hands and feet burn    Current Outpatient Medications on File Prior to Visit  Medication Sig  . ADVAIR DISKUS 250-50 MCG/DOSE AEPB inhale 1 puff by mouth twice a day if needed for shortness of breath  . almotriptan (AXERT) 12.5 MG tablet - Use stat for Migraine & may repeat 1 x / day in 2 hours if needed  . butalbital-acetaminophen-caffeine (FIORICET, ESGIC) 50-325-40 MG tablet Take 1-2 tablets by mouth every 6 (six) hours as needed for headache.  . Cholecalciferol (VITAMIN D3) 125 MCG (5000 UT) CAPS Take by mouth daily.  Marland Kitchen conjugated estrogens (PREMARIN) vaginal cream Place 1 Applicatorful vaginally daily.  . cycloSPORINE (RESTASIS) 0.05 % ophthalmic emulsion Place 1 drop into both eyes 2 (two) times daily.    Marland Kitchen dextroamphetamine (DEXEDRINE SPANSULE) 10 MG 24 hr capsule Take 10 mg by mouth daily.   Marland Kitchen eletriptan (RELPAX) 40 MG tablet TAKE 1 TABLET BY MOUTH IMMEDIATELY IF NEEDED FOR HEADACHE  . estradiol (VIVELLE-DOT) 0.05 MG/24HR patch Apply 1 patch 2 x /week  . fluticasone (FLONASE) 50 MCG/ACT nasal spray SHAKE LIQUID AND USE 2 SPRAYS IN EACH NOSTRIL EVERY DAY  . LATISSE 0.03 % ophthalmic solution place 1 drop  ON APPLICATOR AND APPLY EVENLY ALONG SKIN OF UPPER EYELIDS AT BASE OF EYELASHES once daily at bedtime  . linaclotide (LINZESS) 72 MCG capsule Take 1 capsule daily for IBS-C                                                                             m  . loratadine (CLARITIN) 10 MG tablet Take 10 mg by mouth daily.  . magnesium gluconate (MAGONATE) 500 MG tablet Take 500 mg by mouth daily.  . ondansetron (ZOFRAN) 8 MG tablet Take 1/2 to 1 tablet 2 to 3 x /day as needed for Nausea / Emesis  .  predniSONE (DELTASONE) 10 MG tablet TAKE 1 TABLET BY MOUTH FOR 5 DAYS ONCE A MONTH  . SAVELLA 100 MG TABS tablet TAKE 1 TO 2 TABLETS BY MOUTH ONCE DAILY FOR MOOD AND DEPRESSION AS DIRECTED  . Sennosides (SENOKOT PO) Take 1 tablet by mouth as needed.   . traZODone (DESYREL) 50 MG tablet Take 25 mg by mouth at bedtime.  . triamterene-hydrochlorothiazide (MAXZIDE) 75-50 MG tablet TAKE 1 TABLET BY MOUTH ONCE DAILY FOR HIGH BLOOD PRESSURE AND FLUID RETENTION (Patient taking differently: as needed. )  . metroNIDAZOLE (METROGEL) 0.75 % vaginal gel Place 1 Applicatorful vaginally at bedtime. 1 applicator at night for 5 days (Patient not taking: Reported on 08/03/2018)  . montelukast (SINGULAIR) 10 MG tablet TAKE 1 TABLET BY MOUTH ONCE DAILY (Patient not taking: Reported on 08/03/2018)  . triamcinolone ointment (KENALOG) 0.1 % Apply 1 application topically 2 (two) times daily.   No current facility-administered medications on file prior to visit.     ROS: all negative except above.   Physical Exam:  BP 110/76   Pulse 72   Temp 97.7 F (36.5 C)   Ht 5\' 1"  (1.549 m)   Wt 108 lb (49 kg)   SpO2 97%   BMI 20.41 kg/m   General Appearance: Well nourished, in no apparent distress. Eyes: PERRLA, EOMs, conjunctiva no swelling or erythema Sinuses: No Frontal/maxillary tenderness ENT/Mouth: Ext aud canals clear, TMs without erythema, bulging. No erythema, swelling, or exudate on post pharynx.  Tonsils not swollen or erythematous. Hearing normal.  Neck: Supple, thyroid normal.  Respiratory: Respiratory effort normal, BS equal bilaterally without rales, rhonchi, wheezing or stridor.  Cardio: RRR with no MRGs. Thready peripheral pulses without edema, bilateral feet cool to cough, dusky Abdomen: Soft, + BS.  Non tender, no masses.  Lymphatics: Non tender without lymphadenopathy.  Musculoskeletal: Full ROM, 5/5 strength, normal gait.  Skin: Warm, dry without rashes, lesions, ecchymosis.  Neuro: Sensation  intact.  Psych: Awake and oriented X 3, anxious affect, Insight and Judgment appropriate.     Dan Maker, NP 4:17 PM [facility]

## 2018-08-03 NOTE — Patient Instructions (Signed)
Raynaud Phenomenon    Raynaud phenomenon is a condition that affects the blood vessels (arteries) that carry blood to your fingers and toes. The arteries that supply blood to your ears, lips, nipples, or the tip of your nose might also be affected. Raynaud phenomenon causes the arteries to become narrow temporarily (spasm). As a result, the flow of blood to the affected areas is temporarily decreased. This usually occurs in response to cold temperatures or stress. During an attack, the skin in the affected areas turns white, then blue, and finally red. You may also feel tingling or numbness in those areas.  Attacks usually last for only a brief period, and then the blood flow to the area returns to normal. In most cases, Raynaud phenomenon does not cause serious health problems.  What are the causes?  In many cases, the cause of this condition is not known. The condition may occur on its own (primary Raynaud phenomenon) or may be associated with other diseases or factors (secondary Raynaud phenomenon).  Possible causes may include:  · Diseases or medical conditions that damage the arteries.  · Injuries and repetitive actions that hurt the hands or feet.  · Being exposed to certain chemicals.  · Taking medicines that narrow the arteries.  · Other medical conditions, such as lupus, scleroderma, rheumatoid arthritis, thyroid problems, blood disorders, Sjogren syndrome, or atherosclerosis.  What increases the risk?  The following factors may make you more likely to develop this condition:  · Being 20-40 years old.  · Being female.  · Having a family history of Raynaud phenomenon.  · Living in a cold climate.  · Smoking.  What are the signs or symptoms?  Symptoms of this condition usually occur when you are exposed to cold temperatures or when you have emotional stress. The symptoms may last for a few minutes or up to several hours. They usually affect your fingers but may also affect your toes, nipples, lips, ears, or  the tip of your nose. Symptoms may include:  · Changes in skin color. The skin in the affected areas will turn pale or white. The skin may then change from white to bluish to red as normal blood flow returns to the area.  · Numbness, tingling, or pain in the affected areas.  In severe cases, symptoms may include:  · Skin sores.  · Tissues decaying and dying (gangrene).  How is this diagnosed?  This condition may be diagnosed based on:  · Your symptoms and medical history.  · A physical exam. During the exam, you may be asked to put your hands in cold water to check for a reaction to cold temperature.  · Tests, such as:  ? Blood tests to check for other diseases or conditions.  ? A test to check the movement of blood through your arteries and veins (vascular ultrasound).  ? A test in which the skin at the base of your fingernail is examined under a microscope (nailfold capillaroscopy).  How is this treated?  Treatment for this condition often involves making lifestyle changes and taking steps to control your exposure to cold temperatures. For more severe cases, medicine (calcium channel blockers) may be used to improve blood flow. Surgery is sometimes done to block the nerves that control the affected arteries, but this is rare.  Follow these instructions at home:  Avoiding cold temperatures  Take these steps to avoid exposure to cold:  · If possible, stay indoors during cold weather.  · When you   go outside during cold weather, dress in layers and wear mittens, a hat, a scarf, and warm footwear.  · Wear mittens or gloves when handling ice or frozen food.  · Use holders for glasses or cans containing cold drinks.  · Let warm water run for a while before taking a shower or bath.  · Warm up the car before driving in cold weather.  Lifestyle    · If possible, avoid stressful and emotional situations. Try to find ways to manage your stress, such as:  ? Exercise.  ? Yoga.  ? Meditation.  ? Biofeedback.  · Do not use any  products that contain nicotine or tobacco, such as cigarettes and e-cigarettes. If you need help quitting, ask your health care provider.  · Avoid secondhand smoke.  · Limit your use of caffeine.  ? Switch to decaffeinated coffee, tea, and soda.  ? Avoid chocolate.  · Avoid vibrating tools and machinery.  General instructions  · Protect your hands and feet from injuries, cuts, or bruises.  · Avoid wearing tight rings or wristbands.  · Wear loose fitting socks and comfortable, roomy shoes.  · Take over-the-counter and prescription medicines only as told by your health care provider.  Contact a health care provider if:  · Your discomfort becomes worse despite lifestyle changes.  · You develop sores on your fingers or toes that do not heal.  · Your fingers or toes turn black.  · You have breaks in the skin on your fingers or toes.  · You have a fever.  · You have pain or swelling in your joints.  · You have a rash.  · Your symptoms occur on only one side of your body.  Summary  · Raynaud phenomenon is a condition that affects the arteries that carry blood to your fingers, toes, ears, lips, nipples, or the tip of your nose.  · In many cases, the cause of this condition is not known.  · Symptoms of this condition include changes in skin color, and numbness and tingling of the affected area.  · Treatment for this condition includes lifestyle changes, reducing exposure to cold temperatures, and using medicines for severe cases of the condition.  · Contact your health care provider if your condition worsens despite treatment.  This information is not intended to replace advice given to you by your health care provider. Make sure you discuss any questions you have with your health care provider.  Document Released: 05/29/2000 Document Revised: 12/15/2016 Document Reviewed: 07/13/2016  [employer] Interactive Patient Education © 2019 [employer]

## 2018-08-04 ENCOUNTER — Other Ambulatory Visit: Payer: Self-pay | Admitting: Internal Medicine

## 2018-08-23 ENCOUNTER — Ambulatory Visit (HOSPITAL_COMMUNITY)
Admission: RE | Admit: 2018-08-23 | Discharge: 2018-08-23 | Disposition: A | Discharge status: Home / Self Care | Payer: Medicare Other | Source: Ambulatory Visit | Attending: Cardiology | Admitting: Cardiology

## 2018-08-23 DIAGNOSIS — R0989 Other specified symptoms and signs involving the circulatory and respiratory systems: Secondary | ICD-10-CM

## 2018-08-23 DIAGNOSIS — R238 Other skin changes: Secondary | ICD-10-CM | POA: Diagnosis not present

## 2018-08-24 ENCOUNTER — Other Ambulatory Visit: Payer: Self-pay | Admitting: Adult Health

## 2018-08-24 DIAGNOSIS — R200 Anesthesia of skin: Secondary | ICD-10-CM

## 2018-08-24 DIAGNOSIS — R6889 Other general symptoms and signs: Secondary | ICD-10-CM

## 2018-08-24 DIAGNOSIS — R209 Unspecified disturbances of skin sensation: Secondary | ICD-10-CM

## 2018-08-27 ENCOUNTER — Other Ambulatory Visit: Payer: Self-pay | Admitting: Internal Medicine

## 2018-08-30 ENCOUNTER — Encounter: Payer: Self-pay | Admitting: Internal Medicine

## 2018-08-30 ENCOUNTER — Ambulatory Visit (INDEPENDENT_AMBULATORY_CARE_PROVIDER_SITE_OTHER): Payer: Medicare Other | Admitting: Internal Medicine

## 2018-08-30 ENCOUNTER — Other Ambulatory Visit: Payer: Self-pay

## 2018-08-30 VITALS — BP 134/84 | HR 76 | Temp 97.8°F | Resp 16 | Ht 61.5 in | Wt 108.0 lb

## 2018-08-30 DIAGNOSIS — E782 Mixed hyperlipidemia: Secondary | ICD-10-CM | POA: Diagnosis not present

## 2018-08-30 DIAGNOSIS — R7309 Other abnormal glucose: Secondary | ICD-10-CM | POA: Diagnosis not present

## 2018-08-30 DIAGNOSIS — R7303 Prediabetes: Secondary | ICD-10-CM

## 2018-08-30 DIAGNOSIS — R0989 Other specified symptoms and signs involving the circulatory and respiratory systems: Secondary | ICD-10-CM

## 2018-08-30 DIAGNOSIS — E559 Vitamin D deficiency, unspecified: Secondary | ICD-10-CM | POA: Diagnosis not present

## 2018-08-30 DIAGNOSIS — I7300 Raynaud's syndrome without gangrene: Secondary | ICD-10-CM

## 2018-08-30 DIAGNOSIS — Z79899 Other long term (current) drug therapy: Secondary | ICD-10-CM

## 2018-08-30 DIAGNOSIS — M2550 Pain in unspecified joint: Secondary | ICD-10-CM

## 2018-08-30 MED ORDER — NIFEDIPINE ER OSMOTIC RELEASE 30 MG PO TB24: ORAL_TABLET | ORAL | 3 refills | Status: DC

## 2018-08-30 NOTE — Patient Instructions (Signed)

## 2018-08-30 NOTE — Progress Notes (Signed)
This very nice 68 y.o. MWF  presents for 6 month follow up with HTN, HLD, Pre-Diabetes and Vitamin D Deficiency. Patient was seen recently with c/o numbness and purplish color of her toes & occasionally her fingers for 6 months. She had ABI's done and left toe brachial index was decreased & she has been referred to Dr Cari Caraway for consultation. Today she presents pictures showing pale white fingers & toes followed by pictures showing purplish dusky digits and then ruberous appearance consistent with Raynaud's phemonema. She denies any significant sx's of myalgias or arthralgias or FHx of connective Tissue Dz or Raynaud's. .      Patient is followed expectantly for labile HTN & BP has been controlled at home with her taking Maxide on an as needed basis. Today's BP is at goal - 134/84. Patient has had no complaints of any cardiac type chest pain, palpitations, dyspnea / orthopnea / PND, dizziness, claudication, or dependent edema.     Hyperlipidemia is controlled with diet & meds. Patient denies myalgias or other med SE's. Last Lipids were  Lab Results  Component Value Date   CHOL 203 (H) 03/01/2018   HDL 95 03/01/2018   LDLCALC 96 03/01/2018   TRIG 42 03/01/2018   CHOLHDL 2.1 03/01/2018      Also, the patient has history of  PreDiabetes (A1c 5.9% / 2015 and 6.0% / 2016)  and has had no symptoms of reactive hypoglycemia, diabetic polys, paresthesias or visual blurring.  Last A1c was  Lab Results  Component Value Date   HGBA1C 5.7 (H) 03/01/2018      Further, the patient also has history of Vitamin D Deficiency ("40" / 2016) and supplements vitamin D without any suspected side-effects. Last vitamin D was   Lab Results  Component Value Date   VD25OH 90 03/01/2018   Current Outpatient Medications on File Prior to Visit  Medication Sig  . ADVAIR DISKUS 250-50 MCG/DOSE AEPB inhale 1 puff by mouth twice a day if needed for shortness of breath  . almotriptan (AXERT) 12.5 MG tablet - Use  stat for Migraine & may repeat 1 x / day in 2 hours if needed  . aspirin EC 81 MG tablet Take 81 mg by mouth daily.  . butalbital-acetaminophen-caffeine (FIORICET, ESGIC) 50-325-40 MG tablet Take 1-2 tablets by mouth every 6 (six) hours as needed for headache.  . Cholecalciferol (VITAMIN D3) 125 MCG (5000 UT) CAPS Take by mouth daily.  Marland Kitchen conjugated estrogens (PREMARIN) vaginal cream Place 1 Applicatorful vaginally daily.  . cycloSPORINE (RESTASIS) 0.05 % ophthalmic emulsion Place 1 drop into both eyes 2 (two) times daily.    Marland Kitchen dextroamphetamine (DEXEDRINE SPANSULE) 10 MG 24 hr capsule Take 10 mg by mouth daily.   Marland Kitchen eletriptan (RELPAX) 40 MG tablet TAKE 1 TABLET BY MOUTH IMMEDIATELY IF NEEDED FOR HEADACHE  . estradiol (VIVELLE-DOT) 0.05 MG/24HR patch Apply 1 patch 2 x /week  . fluticasone (FLONASE) 50 MCG/ACT nasal spray SHAKE LIQUID AND USE 2 SPRAYS IN EACH NOSTRIL EVERY DAY  . LATISSE 0.03 % ophthalmic solution PLACE 1 DROP ON APPLICATOR AND APPLY EVENLY ALONG SKIN OF UPPER EYELIDS AT BASE OF EYELASHES ONCE DAILY AT BEDTIME  . linaclotide (LINZESS) 72 MCG capsule Take 1 capsule daily for IBS-C  m  . loratadine (CLARITIN) 10 MG tablet Take 10 mg by mouth daily.  . magnesium gluconate (MAGONATE) 500 MG tablet Take 500 mg by mouth daily.  . montelukast (SINGULAIR) 10 MG tablet TAKE 1 TABLET BY MOUTH ONCE DAILY  . ondansetron (ZOFRAN) 8 MG tablet Take 1/2 to 1 tablet 2 to 3 x /day as needed for Nausea / Emesis  . predniSONE (DELTASONE) 10 MG tablet TAKE 1 TABLET BY MOUTH DAILY FOR 5 DAYS ONCE A MONTH  . SAVELLA 100 MG TABS tablet TAKE 1 TO 2 TABLETS BY MOUTH ONCE DAILY FOR MOOD AND DEPRESSION AS DIRECTED  . Sennosides (SENOKOT PO) Take 1 tablet by mouth as needed.   . traZODone (DESYREL) 50 MG tablet Take 25 mg by mouth at bedtime.  . triamterene-hydrochlorothiazide (MAXZIDE) 75-50 MG tablet TAKE 1 TABLET BY MOUTH ONCE DAILY  FOR HIGH BLOOD PRESSURE AND FLUID RETENTION (Patient taking differently: as needed. )   No current facility-administered medications on file prior to visit.    Allergies  Allergen Reactions  . Hydrocodone-Acetaminophen Shortness Of Breath  . Codeine Hives  . Morphine And Related Hives  . Nitrofurantoin Other (See Comments)    Makes hands and feet burn   PMHx:   Past Medical History:  Diagnosis Date  . Allergy   . Anemia 2016  . Anxiety   . Asthma   . Chronic headaches   . Chronic sinusitis   . Depression   . Fibromyalgia   . GERD (gastroesophageal reflux disease)   . IBS (irritable bowel syndrome)   . Meckel diverticulum 1997  . Meckel's diverticulum   . Neuromuscular disorder (HCC)    fibromyagia  . Osteopenia    Immunization History  Administered Date(s) Administered  . DT 11/06/2004  . Influenza-Unspecified 02/14/2012  . Pneumococcal Conjugate-13 10/26/2017  . Tdap 02/26/2011   Past Surgical History:  Procedure Laterality Date  . ABDOMINAL HYSTERECTOMY    . APPENDECTOMY  1997  . BREAST ENHANCEMENT SURGERY    . CESAREAN SECTION  1979, 1983   x 2  . HEMORRHOID BANDING    . LAPAROSCOPIC ABDOMINAL EXPLORATION     peritonitis, appendectomy   FHx:    Reviewed / unchanged  SHx:    Reviewed / unchanged   Systems Review:  Constitutional: Denies fever, chills, wt changes, headaches, insomnia, fatigue, night sweats, change in appetite. Eyes: Denies redness, blurred vision, diplopia, discharge, itchy, watery eyes.  ENT: Denies discharge, congestion, post nasal drip, epistaxis, sore throat, earache, hearing loss, dental pain, tinnitus, vertigo, sinus pain, snoring.  CV: Denies chest pain, palpitations, irregular heartbeat, syncope, dyspnea, diaphoresis, orthopnea, PND, claudication or edema. Respiratory: denies cough, dyspnea, DOE, pleurisy, hoarseness, laryngitis, wheezing.  Gastrointestinal: Denies dysphagia, odynophagia, heartburn, reflux, water brash, abdominal  pain or cramps, nausea, vomiting, bloating, diarrhea, constipation, hematemesis, melena, hematochezia  or hemorrhoids. Genitourinary: Denies dysuria, frequency, urgency, nocturia, hesitancy, discharge, hematuria or flank pain. Musculoskeletal: Denies arthralgias, myalgias, stiffness, jt. swelling, pain, limping or strain/sprain.  Skin: Denies pruritus, rash, hives, warts, acne, eczema or change in skin lesion(s). Neuro: No weakness, tremor, incoordination, spasms, paresthesia or pain. Psychiatric: Denies confusion, memory loss or sensory loss. Endo: Denies change in weight, skin or hair change.  Heme/Lymph: No excessive bleeding, bruising or enlarged lymph nodes.  Physical Exam  BP 134/84   Pulse 76   Temp 97.8 F (36.6 C)   Resp 16   Ht 5' 1.5" (1.562 m)   Wt 108 lb (49 kg)   BMI 20.08  kg/m   Appears  well nourished, well groomed  and in no distress.  Eyes: PERRLA, EOMs, conjunctiva no swelling or erythema. Sinuses: No frontal/maxillary tenderness ENT/Mouth: EAC's clear, TM's nl w/o erythema, bulging. Nares clear w/o erythema, swelling, exudates. Oropharynx clear without erythema or exudates. Oral hygiene is good. Tongue normal, non obstructing. Hearing intact.  Neck: Supple. Thyroid not palpable. Car 2+/2+ without bruits, nodes or JVD. Chest: Respirations nl with BS clear & equal w/o rales, rhonchi, wheezing or stridor.  Cor: Heart sounds normal w/ regular rate and rhythm without sig. murmurs, gallops, clicks or rubs. Peripheral pulses normal except decreased Lt pedal pulses without edema.  Abdomen: Soft & bowel sounds normal. Non-tender w/o guarding, rebound, hernias, masses or organomegaly.  Lymphatics: Unremarkable.  Musculoskeletal: Full ROM all peripheral extremities, joint stability, 5/5 strength and normal gait.  Skin: Warm, dry without exposed rashes, lesions or ecchymosis apparent.  Neuro: Cranial nerves intact, reflexes equal bilaterally. Sensory-motor testing grossly  intact. Tendon reflexes grossly intact.  Pysch: Alert & oriented x 3.  Insight and judgement nl & appropriate. No ideations.  Assessment and Plan:  1. Labile hypertension  - CBC with Differential/Platelet - COMPLETE METABOLIC PANEL WITH GFR - Magnesium - TSH  2. Hyperlipidemia, mixed  - Lipid panel - TSH  3. Abnormal glucose  - Insulin, random - Hemoglobin A1c  4. Vitamin D deficiency  - VITAMIN D 25 Hydroxyl  5. Prediabetes  - Insulin, random - Hemoglobin A1c  6. Raynaud's phenomenon  - Sedimentation rate - C3 and C4 - Anti-DNA antibody, double-stranded - Cyclic citrul peptide antibody, IgG - C-reactive protein - Complement, total - ANCA screen with reflex titer - Anti-Smith antibody - NIFEdipine-XL 30 MG 24 hr tablet; Take 1 tablet daily for Raynauds  Dispense: 90 tablet; Refill: 3  7. Medication management  - CBC with Differential/Platelet - COMPLETE METABOLIC PANEL WITH GFR - Magnesium - Lipid panel - TSH - Insulin, random - VITAMIN D 25 Hydroxyl - Hemoglobin A1c  8. Arthralgia  - Sedimentation rate - C3 and C4 - Anti-DNA antibody, double-stranded - Cyclic citrul peptide antibody, IgG - C-reactive protein - Complement, total - ANCA screen with reflex titer - Anti-Smith antibody        Discussed  regular exercise, BP monitoring, weight control to achieve/maintain BMI less than 25 and discussed med and SE's. Recommended labs to assess and monitor clinical status with further disposition pending results of labs. Over 30 minutes of exam, counseling, chart review was performed.

## 2018-08-31 LAB — COMPLETE METABOLIC PANEL WITH GFR
AG Ratio: 2 (calc) (ref 1.0–2.5)
ALT: 16 U/L (ref 6–29)
AST: 21 U/L (ref 10–35)
Albumin: 4.2 g/dL (ref 3.6–5.1)
Alkaline phosphatase (APISO): 52 U/L (ref 37–153)
BUN: 18 mg/dL (ref 7–25)
CO2: 29 mmol/L (ref 20–32)
Calcium: 9.3 mg/dL (ref 8.6–10.4)
Chloride: 101 mmol/L (ref 98–110)
Creat: 0.79 mg/dL (ref 0.50–0.99)
GFR, Est African American: 90 mL/min/{1.73_m2} (ref >=60)
GFR, Est Non African American: 77 mL/min/{1.73_m2} (ref >=60)
GLOBULIN: 2.1 g/dL (ref 1.9–3.7)
Glucose, Bld: 97 mg/dL (ref 65–99)
Potassium: 4.4 mmol/L (ref 3.5–5.3)
Sodium: 137 mmol/L (ref 135–146)
Total Bilirubin: 0.4 mg/dL (ref 0.2–1.2)
Total Protein: 6.3 g/dL (ref 6.1–8.1)

## 2018-08-31 LAB — CBC WITH DIFFERENTIAL/PLATELET
Absolute Monocytes: 395 cells/uL (ref 200–950)
Basophils Absolute: 59 cells/uL (ref 0–200)
Basophils Relative: 1.4 %
EOS PCT: 3.5 %
Eosinophils Absolute: 147 cells/uL (ref 15–500)
HCT: 39.5 % (ref 35.0–45.0)
Hemoglobin: 12.9 g/dL (ref 11.7–15.5)
Lymphs Abs: 1583 cells/uL (ref 850–3900)
MCH: 28.9 pg (ref 27.0–33.0)
MCHC: 32.7 g/dL (ref 32.0–36.0)
MCV: 88.4 fL (ref 80.0–100.0)
MONOS PCT: 9.4 %
MPV: 10.9 fL (ref 7.5–12.5)
Neutro Abs: 2016 cells/uL (ref 1500–7800)
Neutrophils Relative %: 48 %
Platelets: 292 10*3/uL (ref 140–400)
RBC: 4.47 10*6/uL (ref 3.80–5.10)
RDW: 14.6 % (ref 11.0–15.0)
Total Lymphocyte: 37.7 %
WBC: 4.2 10*3/uL (ref 3.8–10.8)

## 2018-08-31 LAB — ANCA SCREEN W REFLEX TITER
ANCA Screen: POSITIVE — AB
Atypical P-ANCA titer: 1:40 {titer} — ABNORMAL HIGH

## 2018-08-31 LAB — C3 AND C4
C3 Complement: 119 mg/dL (ref 83–193)
C4 Complement: 22 mg/dL (ref 15–57)

## 2018-08-31 LAB — VITAMIN D 25 HYDROXY (VIT D DEFICIENCY, FRACTURES): VIT D 25 HYDROXY: 87 ng/mL (ref 30–100)

## 2018-08-31 LAB — HEMOGLOBIN A1C
Hgb A1c MFr Bld: 5.8 % of total Hgb — ABNORMAL HIGH (ref <5.7)
MEAN PLASMA GLUCOSE: 120 (calc)
eAG (mmol/L): 6.6 (calc)

## 2018-08-31 LAB — LIPID PANEL
Cholesterol: 203 mg/dL — ABNORMAL HIGH (ref <200)
HDL: 97 mg/dL (ref >=50)
LDL CHOLESTEROL (CALC): 92 mg/dL
Non-HDL Cholesterol (Calc): 106 mg/dL (calc) (ref <130)
Total CHOL/HDL Ratio: 2.1 (calc) (ref <5.0)
Triglycerides: 53 mg/dL (ref <150)

## 2018-08-31 LAB — C-REACTIVE PROTEIN: CRP: 0.3 mg/L (ref <8.0)

## 2018-08-31 LAB — ANTI-SMITH ANTIBODY: ENA SM Ab Ser-aCnc: <1 AI

## 2018-08-31 LAB — MAGNESIUM: Magnesium: 2 mg/dL (ref 1.5–2.5)

## 2018-08-31 LAB — COMPLEMENT, TOTAL: Compl, Total (CH50): >60 U/mL — ABNORMAL HIGH (ref 31–60)

## 2018-08-31 LAB — INSULIN, RANDOM: Insulin: 3.8 u[IU]/mL

## 2018-08-31 LAB — TSH: TSH: 1.35 mIU/L (ref 0.40–4.50)

## 2018-08-31 LAB — SEDIMENTATION RATE: SED RATE: 6 mm/h (ref 0–30)

## 2018-08-31 LAB — ANTI-DNA ANTIBODY, DOUBLE-STRANDED: ds DNA Ab: <1 IU/mL

## 2018-08-31 LAB — CYCLIC CITRUL PEPTIDE ANTIBODY, IGG: Cyclic Citrullin Peptide Ab: <16 UNITS

## 2018-09-03 ENCOUNTER — Encounter: Payer: Self-pay | Admitting: Internal Medicine

## 2018-10-01 ENCOUNTER — Other Ambulatory Visit: Payer: Self-pay | Admitting: Internal Medicine

## 2018-10-24 NOTE — Progress Notes (Signed)
Virtual Visit via Telephone Note  I connected with Alexis Weaver on 10/25/18 at  2:00 PM EDT by telephone and verified that I am speaking with the correct person using two identifiers.   I discussed the limitations, risks, security and privacy concerns of performing an evaluation and management service by telephone and the availability of in person appointments. I also discussed with the patient that there may be a patient responsible charge related to this service. The patient expressed understanding and agreed to proceed.   MEDICARE ANNUAL WELLNESS VISIT AND FOLLOW UP Assessment:    Alexis Weaver was seen today for medicare wellness.  Diagnoses and all orders for this visit:  Medicare annual wellness visit, subsequent Yearly  Labile hypertension Checks blood pressure when out Has manual husband unable to check for her Continue to monitor Continue medications  Hyperlipidemia, mixed Discussed dietary and exercise modifications. Will check labs next office visit  Recurrent major depressive disorder, in partial remission (HCC) Doing well on current regiment Continue with benefit  Attention deficit disorder (ADD) without hyperactivity Continue current regiment continue with benefit  Anxiety Doing well Discussed coping mechanisms in addition to medications  Insomnia, unspecified type Using trazadone Doing well at this time Continue with benefit  Osteopenia, unspecified location Taking calcium and Vitamin D DEXA, UTD  Anemia, unspecified type Not taking supplementation at this time Will check labs next office visit  Vitamin D deficiency Taking supplementation To goal last check  Abnormal glucose Discussed dietary and exercise modifications Will check labs next office visit  Irritable bowel syndrome with constipation Discussed dietary modifications Increase water intake  Taking linzess   Vaginal atrophy Likely from estrogen deficency Has estrogen patch but has  not been wearing Also has premarin cream but has not used this Discussed medications with patient Discussed OTC Replens   Follow Up Instructions:    I discussed the assessment and treatment plan with the patient. The patient was provided an opportunity to ask questions and all were answered. The patient agreed with the plan and demonstrated an understanding of the instructions.   The patient was advised to call back or seek an in-person evaluation if the symptoms worsen or if the condition fails to improve as anticipated.  I provided 30 minutes of non-face-to-face time during this encounter including counseling, chart review, and critical decision making was preformed.   Future Appointments  Date Time Provider Department Center  03/08/2019 10:00 AM Quentin Mullingollier, Amanda, PA-C [facility] None  03/14/2019 11:15 AM Quentin Mullingollier, Amanda, PA-C [facility] None      Plan:   During the course of the visit the patient was educated and counseled about appropriate screening and preventive services including:    Pneumococcal vaccine   Influenza vaccine  Prevnar 13  Td vaccine  Screening electrocardiogram, deferred, telephone visit COVID19.  Colorectal cancer screening  Diabetes screening  Glaucoma screening  Nutrition counseling    Subjective:  Alexis Weaver is a 68 y.o. female who presents for Medicare Annual Wellness Visit and 3 month follow up for HTN, hyperlipidemia diet controlled, prediabetes diet controlled,fibromyalgia, estrogen deficiency and vitamin D Def.  She reports she is having any increase in allergy symptoms, with some drainage that is intermittent and worse in the morning.  She denies any fevers, cough, shortness of breath,nasal congestion,  otalgia but endorses some ear fullness on one side.  Denies any dizziness, change in vision or chest pains. Her blood pressure has been controlled but does not check her blood pressure at home.  She does workout. She denies chest pain,  shortness of breath, dizziness.  She is not on cholesterol medication and denies myalgias. Her cholesterol is not at goal. The cholesterol last visit was:   Lab Results  Component Value Date   CHOL 203 (H) 08/30/2018   HDL 97 08/30/2018   LDLCALC 92 08/30/2018   TRIG 53 08/30/2018   CHOLHDL 2.1 08/30/2018   She has been working on diet and exercise for prediabetes, and denies increased appetite, nausea, paresthesia of the feet, polydipsia, polyuria, visual disturbances, vomiting and weight loss. Last A1C in the office was:  Lab Results  Component Value Date   HGBA1C 5.8 (H) 08/30/2018   Last GFR Lab Results  Component Value Date   GFRNONAA 77 08/30/2018     Lab Results  Component Value Date   GFRAA 90 08/30/2018   Patient is on Vitamin D supplement.   Lab Results  Component Value Date   VD25OH 87 08/30/2018      Medication Review:  Current Outpatient Medications (Endocrine & Metabolic):  .  estradiol (VIVELLE-DOT) 0.05 MG/24HR patch, Apply 1 patch 2 x /week .  predniSONE (DELTASONE) 10 MG tablet, TAKE 1 TABLET BY MOUTH DAILY FOR 5 DAYS ONCE A MONTH  Current Outpatient Medications (Cardiovascular):  .  triamterene-hydrochlorothiazide (MAXZIDE) 75-50 MG tablet, TAKE 1 TABLET BY MOUTH ONCE DAILY FOR HIGH BLOOD PRESSURE AND FLUID RETENTION (Patient taking differently: as needed. ) .  NIFEdipine (PROCARDIA-XL/NIFEDICAL-XL) 30 MG 24 hr tablet, Take 1 tablet daily for Raynauds (Patient not taking: Reported on 10/25/2018)  Current Outpatient Medications (Respiratory):  Marland Kitchen  ADVAIR DISKUS 250-50 MCG/DOSE AEPB, inhale 1 puff by mouth twice a day if needed for shortness of breath .  loratadine (CLARITIN) 10 MG tablet, Take 10 mg by mouth daily.  Current Outpatient Medications (Analgesics):  .  almotriptan (AXERT) 12.5 MG tablet, - Use stat for Migraine & may repeat 1 x / day in 2 hours if needed .  aspirin EC 81 MG tablet, Take 81 mg by mouth daily. .   butalbital-acetaminophen-caffeine (FIORICET, ESGIC) 50-325-40 MG tablet, Take 1-2 tablets by mouth every 6 (six) hours as needed for headache. .  eletriptan (RELPAX) 40 MG tablet, TAKE 1 TABLET BY MOUTH IMMEDIATELY IF NEEDED FOR HEADACHE   Current Outpatient Medications (Other):  Marland Kitchen  Cholecalciferol (VITAMIN D3) 125 MCG (5000 UT) CAPS, Take by mouth daily. Marland Kitchen  conjugated estrogens (PREMARIN) vaginal cream, Place 1 Applicatorful vaginally daily. .  cycloSPORINE (RESTASIS) 0.05 % ophthalmic emulsion, Place 1 drop into both eyes 2 (two) times daily.   Marland Kitchen  dextroamphetamine (DEXEDRINE SPANSULE) 10 MG 24 hr capsule, Take 10 mg by mouth daily.  Marland Kitchen  LATISSE 0.03 % ophthalmic solution, PLACE 1 DROP ON APPLICATOR AND APPLY EVENLY ALONG SKIN OF UPPER EYELIDS AT BASE OF EYELASHES ONCE DAILY AT BEDTIME .  linaclotide (LINZESS) 72 MCG capsule, Take 1 capsule daily for IBS-C                                                                             m .  magnesium gluconate (MAGONATE) 500 MG tablet, Take 500 mg by mouth daily. .  Milnacipran HCl (SAVELLA) 100 MG TABS tablet,  Take 2 tablets  /Daily for Mood .  ondansetron (ZOFRAN) 8 MG tablet, Take 1/2 to 1 tablet 2 to 3 x /day as needed for Nausea / Emesis .  Sennosides (SENOKOT PO), Take 1 tablet by mouth as needed.  .  traZODone (DESYREL) 50 MG tablet, Take 25 mg by mouth at bedtime.  Allergies: Allergies  Allergen Reactions  . Hydrocodone-Acetaminophen Shortness Of Breath  . Codeine Hives  . Morphine And Related Hives  . Nitrofurantoin Other (See Comments)    Makes hands and feet burn    Current Problems (verified) has Palpitations; Asthma, chronic; Chronic sinusitis; Anxiety; Major depression in partial remission (HCC); Osteopenia; Prediabetes; Medication management; IBS (irritable bowel syndrome)-constipation type; Bilateral shoulder pain; Vitamin D deficiency; Anemia; and Labile hypertension on their problem list.  Screening Tests Immunization  History  Administered Date(s) Administered  . DT 11/06/2004  . Influenza-Unspecified 02/14/2012  . Pneumococcal Conjugate-13 10/26/2017  . Tdap 02/26/2011    Preventative care: Last colonoscopy: 2017 mammogram: 2019, Due 2020 DEXA: 2019  Prior vaccinations: TD or Tdap: 2012  Influenza: 2019, due for 2020 Pneumococcal: DUE Prevnar13: 2019 Shingles: Shingrix: Due    Names of Other Physician/Practitioners you currently use: 1. [facility] here for primary care 2. Eye Exam, Dr Emily Filbert 2018 3. Dental Exam, Dr Fara Olden, 2018  Patient Care Team: [name], MD as PCP - General (Internal Medicine) Sidney Ace, MD as Referring Physician (Allergy) Marcene Corning, MD as Consulting Physician (Orthopedic Surgery)  Surgical: She  has a past surgical history that includes Abdominal hysterectomy; Cesarean section (1979, 1983); Breast enhancement surgery; Laparoscopic abdominal exploration; Hemorrhoid banding; and Appendectomy (1997). Family Her family history includes Asthma in her brother; COPD in her brother; Diabetes in her brother and father; Diverticulitis in her maternal grandmother; Hyperlipidemia in her brother; Hypertension in her father and mother; Rheum arthritis in her sister. Social history  She reports that she has never smoked. She has never used smokeless tobacco. She reports that she does not drink alcohol or use drugs.  MEDICARE WELLNESS OBJECTIVES: Physical activity: Current Exercise Habits: Home exercise routine, Type of exercise: walking(gardening, housework), Time (Minutes): 30, Frequency (Times/Week): 7, Weekly Exercise (Minutes/Week): 210, Intensity: Moderate, Exercise limited by: neurologic condition(s)(numbess intermittent) Cardiac risk factors:   Depression/mood screen:   Depression screen [facility] 2/9 10/25/2018  Decreased Interest 0  Down, Depressed, Hopeless 0  PHQ - 2 Score 0  Altered sleeping -  Tired, decreased  energy -  Change in appetite -  Feeling bad or failure about yourself  -  Moving slowly or fidgety/restless -  Suicidal thoughts -  PHQ-9 Score -  Difficult doing work/chores -    ADLs:  In your present state of health, do you have any difficulty performing the following activities: 10/25/2018 09/03/2018  Hearing? N N  Vision? N N  Difficulty concentrating or making decisions? N N  Walking or climbing stairs? N N  Dressing or bathing? N N  Doing errands, shopping? N N  Preparing Food and eating ? N -  Using the Toilet? N -  In the past six months, have you accidently leaked urine? N -  Do you have problems with loss of bowel control? N -  Managing your Medications? N -  Managing your Finances? N -  Housekeeping or managing your Housekeeping? N -  Some recent data might be hidden     Cognitive Testing  Alert? Yes  Normal Appearance?Yes  Oriented to person? Yes  Place?  Yes   Time? Yes  Recall of three objects?  Yes  Can perform simple calculations? Yes  Displays appropriate judgment?Yes  Can read the correct time from a watch face?Yes  EOL planning: Does Patient Have a Medical Advance Directive?: Yes Type of Advance Directive: Healthcare Power of Attorney, Living will Does patient want to make changes to medical advance directive?: Yes (Inpatient - patient defers changing a medical advance directive at this time - Information given) Copy of Healthcare Power of Attorney in Chart?: No - copy requested   Objective:   Today's Vitals   10/25/18 1340  Height: 5' 1.5" (1.562 m)   Body mass index is 20.08 kg/m.  General : Well sounding patient in no apparent distress HEENT: no hoarseness, no cough for duration of visit Lungs: speaks in complete sentences, no audible wheezing, no apparent distress Neurological: alert, oriented x 3 Psychiatric: pleasant, judgement appropriate   Medicare Attestation I have personally reviewed: The patient's medical and social history Their  use of alcohol, tobacco or illicit drugs Their current medications and supplements The patient's functional ability including ADLs,fall risks, home safety risks, cognitive, and hearing and visual impairment Diet and physical activities Evidence for depression or mood disorders  The patient's weight, height, BMI, and visual acuity have been recorded in the chart.  I have made referrals, counseling, and provided education to the patient based on review of the above and I have provided the patient with a written personalized care plan for preventive services.     Elder Negus, NP [facility] 10/25/2018  2:35 PM

## 2018-10-25 ENCOUNTER — Other Ambulatory Visit: Payer: Self-pay

## 2018-10-25 ENCOUNTER — Encounter: Payer: Self-pay | Admitting: Adult Health Nurse Practitioner

## 2018-10-25 ENCOUNTER — Ambulatory Visit: Payer: Medicare Other | Admitting: Adult Health Nurse Practitioner

## 2018-10-25 VITALS — Ht 61.5 in

## 2018-10-25 DIAGNOSIS — K581 Irritable bowel syndrome with constipation: Secondary | ICD-10-CM

## 2018-10-25 DIAGNOSIS — F3341 Major depressive disorder, recurrent, in partial remission: Secondary | ICD-10-CM | POA: Diagnosis not present

## 2018-10-25 DIAGNOSIS — G4700 Insomnia, unspecified: Secondary | ICD-10-CM

## 2018-10-25 DIAGNOSIS — E782 Mixed hyperlipidemia: Secondary | ICD-10-CM | POA: Diagnosis not present

## 2018-10-25 DIAGNOSIS — D649 Anemia, unspecified: Secondary | ICD-10-CM

## 2018-10-25 DIAGNOSIS — Z0000 Encounter for general adult medical examination without abnormal findings: Secondary | ICD-10-CM

## 2018-10-25 DIAGNOSIS — F988 Other specified behavioral and emotional disorders with onset usually occurring in childhood and adolescence: Secondary | ICD-10-CM | POA: Diagnosis not present

## 2018-10-25 DIAGNOSIS — F419 Anxiety disorder, unspecified: Secondary | ICD-10-CM

## 2018-10-25 DIAGNOSIS — R6889 Other general symptoms and signs: Secondary | ICD-10-CM | POA: Diagnosis not present

## 2018-10-25 DIAGNOSIS — Z0001 Encounter for general adult medical examination with abnormal findings: Secondary | ICD-10-CM

## 2018-10-25 DIAGNOSIS — E559 Vitamin D deficiency, unspecified: Secondary | ICD-10-CM

## 2018-10-25 DIAGNOSIS — R7309 Other abnormal glucose: Secondary | ICD-10-CM

## 2018-10-25 DIAGNOSIS — M8580 Other specified disorders of bone density and structure, unspecified site: Secondary | ICD-10-CM

## 2018-10-25 DIAGNOSIS — R0989 Other specified symptoms and signs involving the circulatory and respiratory systems: Secondary | ICD-10-CM

## 2018-10-25 DIAGNOSIS — N952 Postmenopausal atrophic vaginitis: Secondary | ICD-10-CM

## 2018-11-04 ENCOUNTER — Encounter: Payer: Self-pay | Admitting: Adult Health Nurse Practitioner

## 2018-12-06 ENCOUNTER — Other Ambulatory Visit: Payer: Self-pay | Admitting: Internal Medicine

## 2018-12-06 MED ORDER — FLUCONAZOLE 150 MG PO TABS: ORAL_TABLET | ORAL | 3 refills | Status: DC

## 2018-12-15 ENCOUNTER — Other Ambulatory Visit: Payer: Self-pay | Admitting: Internal Medicine

## 2018-12-15 MED ORDER — PREDNISONE 10 MG PO TABS: ORAL_TABLET | ORAL | 0 refills | Status: DC

## 2018-12-27 ENCOUNTER — Other Ambulatory Visit: Payer: Self-pay | Admitting: Internal Medicine

## 2018-12-27 DIAGNOSIS — K581 Irritable bowel syndrome with constipation: Secondary | ICD-10-CM

## 2019-01-02 ENCOUNTER — Other Ambulatory Visit: Payer: Self-pay | Admitting: Adult Health

## 2019-01-11 ENCOUNTER — Other Ambulatory Visit: Payer: Self-pay | Admitting: Internal Medicine

## 2019-01-11 DIAGNOSIS — E348 Other specified endocrine disorders: Secondary | ICD-10-CM

## 2019-01-11 MED ORDER — ESTRADIOL 0.05 MG/24HR TD PTTW: MEDICATED_PATCH | TRANSDERMAL | 3 refills | Status: DC

## 2019-03-08 ENCOUNTER — Encounter: Payer: Self-pay | Admitting: Physician Assistant

## 2019-03-14 ENCOUNTER — Ambulatory Visit: Payer: Self-pay | Admitting: Physician Assistant

## 2019-03-21 NOTE — Progress Notes (Signed)
CPE  Assessment:   Prediabetes Discussed general issues about diabetes pathophysiology and management., Educational material distributed., Suggested low cholesterol diet., Encouraged aerobic exercise., Discussed foot care., Reminded to get yearly retinal exam.  Osteopenia Due 12/2019, continue vit D and calcium, add on weight bearing exercises  Chronic maxillary sinusitis Monitor; continue daily allergy pill, saline nasal irrigations, nasal steroids   Vitamin D deficiency - Vit D  25 hydroxy   Encounter for long-term (current) use of medications - CBC with Differential/Platelet - CMP/GFR - Magnesium  Major Depression in partial remission Follow up Dr. Lyanne CoKeur, no SI/HI - TSH   Anxiety Follow up Dr. Lyanne CoKeur, stable   IBS (irritable bowel syndrome)-constipation type Continue linzess doing well  Asthma, chronic, unspecified asthma severity, uncomplicated Continue medications  Migraines Recently improved; has seen neuro  Screening cholesterol level -     Lipid panel    Over 30 minutes of exam, counseling, chart review and critical decision making was performed Future Appointments  Date Time Provider Department Center  09/26/2019 10:30 AM Lucky CowboyMcKeown, William, MD [facility] None  03/22/2020  9:30 AM Quentin Mullingollier, Amanda, PA-C [facility] None      Subjective:  Alexis Weaver is a 68 y.o. female who presents for CPE and follow up. She has Palpitations; Asthma, chronic; Chronic sinusitis; Anxiety; Major depression in partial remission (HCC); Osteopenia; Prediabetes; Medication management; IBS (irritable bowel syndrome)-constipation type; Bilateral shoulder pain; Vitamin D deficiency; and Labile hypertension on their problem list.   She is married, 2 children; retired Engineer, civil (consulting)nurse.   She has had negative ANA, AntiDNA, RF. She has FM and is on savella.   She reports long history of migraines; has seen neuro; much improved in recent years since retiring; she does have abortive relpax which  works well.   She has had abdominal hysterectomy and is on estrogen therapy (vivielle dot and premarin vaginal) and on bASA.   She sees a therapist and Dr. Evelene CroonKaur. She states she is taking xanax just at night for sleep.   Sees Dr. Ezzard StandingNewman and [place] CallasSharma for allergies/asthma. She is on prednisone very rarely for allergies.   On linzess for IBS-C and manages fairly.   She had normal colonoscopy by Dr. Russella DarStark in 2017.   She has reported episodic red/blue/white discoloration and coolness of extremities and underwent LE vascular US which was unremarkable; felt to be Raynaud's, was prescribed procardia but was unable to tolerate with low BP. She uses extra gloves/socks and lifestyle modification and has been able to tolerate.   BMI is Body mass index is 20.22 kg/m., she has been working on diet and exercise, exercises 60+ min at least 5 days a week.  Wt Readings from Last 3 Encounters:  03/23/19 107 lb (48.5 kg)  08/30/18 108 lb (49 kg)  08/03/18 108 lb (49 kg)    Her blood pressure has been controlled at home, today their BP is BP: 118/70 She does workout. She denies chest pain, shortness of breath, dizziness.    She is not on cholesterol medication and denies myalgias. Her cholesterol is at goal. The cholesterol last visit was:   Lab Results  Component Value Date   CHOL 203 (H) 08/30/2018   HDL 97 08/30/2018   LDLCALC 92 08/30/2018   TRIG 53 08/30/2018   CHOLHDL 2.1 08/30/2018   Last X9JA1C  Lab Results  Component Value Date   HGBA1C 5.8 (H) 08/30/2018   Lab Results  Component Value Date   GFRNONAA 77 08/30/2018   Patient is on Vitamin  D supplement.   Lab Results  Component Value Date   VD25OH 87 08/30/2018       Medication Review: Current Outpatient Medications on File Prior to Visit  Medication Sig Dispense Refill  . ADVAIR DISKUS 250-50 MCG/DOSE AEPB inhale 1 puff by mouth twice a day if needed for shortness of breath 60 each 11  . almotriptan (AXERT) 12.5 MG tablet - Use stat  for Migraine & may repeat 1 x / day in 2 hours if needed 12 tablet 5  . aspirin EC 81 MG tablet Take 81 mg by mouth daily.    . Cholecalciferol (VITAMIN D3) 125 MCG (5000 UT) CAPS Take by mouth daily.    . cycloSPORINE (RESTASIS) 0.05 % ophthalmic emulsion Place 1 drop into both eyes 2 (two) times daily.      Marland Kitchen dextroamphetamine (DEXEDRINE SPANSULE) 10 MG 24 hr capsule Take 10 mg by mouth daily.   0  . eletriptan (RELPAX) 40 MG tablet TAKE 1 TABLET BY MOUTH IMMEDIATELY IF NEEDED FOR HEADACHE 6 tablet 5  . estradiol (VIVELLE-DOT) 0.05 MG/24HR patch Apply 1 patch 2 x /week 24 patch 3  . LATISSE 0.03 % ophthalmic solution PLACE 1 DROP ON APPLICATOR AND APPLY EVENLY ALONG SKIN OF UPPER EYELIDS AT BASE OF EYELASHES ONCE DAILY AT BEDTIME 5 mL 11  . linaclotide (LINZESS) 72 MCG capsule Take 1 capsule Daily for IBS-C 90 capsule 3  . loratadine (CLARITIN) 10 MG tablet Take 10 mg by mouth daily.    . magnesium gluconate (MAGONATE) 500 MG tablet Take 500 mg by mouth daily.    . Milnacipran HCl (SAVELLA) 100 MG TABS tablet Take 2 tablets  /Daily for Mood 180 tablet 1  . ondansetron (ZOFRAN) 8 MG tablet Take 1/2 to 1 tablet 2 to 3 x /day as needed for Nausea / Emesis 100 tablet 0  . predniSONE (DELTASONE) 10 MG tablet TAKE 1 TABLET BY MOUTH DAILY FOR 5 DAYS ONCE A MONTH 30 tablet 0  . Sennosides (SENOKOT PO) Take 1 tablet by mouth as needed.     . traZODone (DESYREL) 50 MG tablet Take 25 mg by mouth at bedtime.    . triamterene-hydrochlorothiazide (MAXZIDE) 75-50 MG tablet Take 1 tablet Daily for BP & Fluid Retention 90 tablet 3  . conjugated estrogens (PREMARIN) vaginal cream Place 1 Applicatorful vaginally daily. (Patient not taking: Reported on 03/23/2019) 42.5 g 12   No current facility-administered medications on file prior to visit.     Allergies  Allergen Reactions  . Hydrocodone-Acetaminophen Shortness Of Breath  . Codeine Hives  . Morphine And Related Hives  . Nitrofurantoin Other (See  Comments)    Makes hands and feet burn    Current Problems (verified) Patient Active Problem List   Diagnosis Date Noted  . 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 Prediabetes 06/26/2014  . Medication management 06/26/2014  . IBS (irritable bowel syndrome)-constipation type 06/26/2014  . Bilateral shoulder pain 06/26/2014  . Vitamin D deficiency 06/26/2014  . Asthma, chronic   . Chronic sinusitis   . Anxiety   . Major depression in partial remission ([place])   . Osteopenia   . Palpitations 04/01/2010    Screening Tests Immunization History  Administered Date(s) Administered  . DT 11/06/2004  . Influenza, High Dose Seasonal PF 03/23/2019  . Influenza-Unspecified 02/14/2012  . Pneumococcal Conjugate-13 10/26/2017  . Pneumococcal Polysaccharide-23 03/23/2019  . Tdap 02/26/2011     Tetanus: 2012 Pneumovax: DUE Prevnar 13: 10/2017 Flu vaccine:  Zostavax: N/A  Pap: 2016, declines another,never abnormal pap MGM: 12/2017 [facility] - will schedule, delayed due to covid DEXA: 12/2017 R fem T -2.3 Colonoscopy: 2017  Dr. Russella Dar EGD: 10/2015  MRI brain 2010 CXR 2015  Names of Other Physician/Practitioners you currently use: 1. [facility] here for primary care 2. Dr. Emily Filbert, eye doctor, last visit 07/2017 3. Dr. Fara Olden, dentist, last visit 2020, goes q68m  4. Dr. Terri Piedra, derm, last several years  Patient Care Team: [name], MD as PCP - General (Internal Medicine) Sidney Ace, MD as Referring Physician (Allergy) Marcene Corning, MD as Consulting Physician (Orthopedic Surgery)  SURGICAL HISTORY She  has a past surgical history that includes Abdominal hysterectomy; Cesarean section (1979, 1983); Breast enhancement surgery; Laparoscopic abdominal exploration; Hemorrhoid banding; and Appendectomy (1997). FAMILY HISTORY Her family history includes Asthma in her brother; COPD in her brother; Diabetes in her brother and father; Diverticulitis in  her maternal grandmother; Hyperlipidemia in her brother; Hypertension in her father and mother; Rheum arthritis in her sister. SOCIAL HISTORY She  reports that she has never smoked. She has never used smokeless tobacco. She reports that she does not drink alcohol or use drugs.   Review of Systems  Constitutional: Negative for chills, diaphoresis, fever, malaise/fatigue and weight loss.  HENT: Negative for congestion, ear discharge, ear pain, hearing loss, nosebleeds, sore throat and tinnitus.   Eyes: Negative.   Respiratory: Negative for cough, hemoptysis, sputum production, shortness of breath, wheezing and stridor.   Cardiovascular: Negative.   Gastrointestinal: Positive for constipation (on amitiza). Negative for abdominal pain, blood in stool, diarrhea, heartburn, melena, nausea and vomiting.  Genitourinary: Negative for dysuria, flank pain, frequency, hematuria and urgency.  Musculoskeletal: Negative for back pain, falls, joint pain, myalgias and neck pain.  Skin: Negative.   Neurological: Negative.  Negative for weakness and headaches.  Psychiatric/Behavioral: Positive for depression. Negative for hallucinations, memory loss, substance abuse and suicidal ideas. The patient is not nervous/anxious.      Objective:     Today's Vitals   03/23/19 1011  BP: 118/70  Pulse: 66  Temp: (!) 97.2 F (36.2 C)  SpO2: 98%  Weight: 107 lb (48.5 kg)  Height: 5\' 1"  (1.549 m)   Body mass index is 20.22 kg/m.  General appearance: alert, no distress, WD/WN, female HEENT: normocephalic, sclerae anicteric, TMs pearly, nares patent, no discharge or erythema, pharynx normal Oral cavity: MMM, no lesions Neck: supple, no lymphadenopathy, no thyromegaly, no masses Heart: RRR, normal S1, S2, no murmurs Lungs: CTA bilaterally, no wheezes, rhonchi, or rales Breasts: breasts appear normal, no suspicious masses, no skin or nipple changes or axillary nodes, bilateral implants, no palpable abnormalities  otherwise. Abdomen: +bs, soft, non tender, non distended, no masses, no hepatomegaly, no splenomegaly Musculoskeletal: nontender, no swelling, no obvious deformity. Extremities: no edema, no cyanosis, no clubbing Pulses: 2+ symmetric, upper and lower extremities, normal cap refill Neurological: alert, oriented x 3, CN2-12 intact, strength normal upper extremities and lower extremities, sensation normal throughout, DTRs 2+ throughout, no cerebellar signs, gait normal Psychiatric: normal affect, behavior normal, pleasant   EKG: sinus arrhythmia; No st changes; WNL  , NP   03/23/2019

## 2019-03-23 ENCOUNTER — Encounter: Payer: Self-pay | Admitting: Adult Health

## 2019-03-23 ENCOUNTER — Ambulatory Visit (INDEPENDENT_AMBULATORY_CARE_PROVIDER_SITE_OTHER): Payer: Medicare Other | Admitting: Adult Health

## 2019-03-23 ENCOUNTER — Other Ambulatory Visit: Payer: Self-pay

## 2019-03-23 VITALS — BP 118/70 | HR 66 | Temp 97.2°F | Ht 61.0 in | Wt 107.0 lb

## 2019-03-23 DIAGNOSIS — G43919 Migraine, unspecified, intractable, without status migrainosus: Secondary | ICD-10-CM

## 2019-03-23 DIAGNOSIS — Z6820 Body mass index (BMI) 20.0-20.9, adult: Secondary | ICD-10-CM

## 2019-03-23 DIAGNOSIS — K589 Irritable bowel syndrome without diarrhea: Secondary | ICD-10-CM

## 2019-03-23 DIAGNOSIS — Z0000 Encounter for general adult medical examination without abnormal findings: Secondary | ICD-10-CM

## 2019-03-23 DIAGNOSIS — R0989 Other specified symptoms and signs involving the circulatory and respiratory systems: Secondary | ICD-10-CM

## 2019-03-23 DIAGNOSIS — F419 Anxiety disorder, unspecified: Secondary | ICD-10-CM

## 2019-03-23 DIAGNOSIS — K581 Irritable bowel syndrome with constipation: Secondary | ICD-10-CM

## 2019-03-23 DIAGNOSIS — Z23 Encounter for immunization: Secondary | ICD-10-CM

## 2019-03-23 DIAGNOSIS — F3341 Major depressive disorder, recurrent, in partial remission: Secondary | ICD-10-CM

## 2019-03-23 DIAGNOSIS — E559 Vitamin D deficiency, unspecified: Secondary | ICD-10-CM

## 2019-03-23 DIAGNOSIS — J45909 Unspecified asthma, uncomplicated: Secondary | ICD-10-CM

## 2019-03-23 DIAGNOSIS — Z131 Encounter for screening for diabetes mellitus: Secondary | ICD-10-CM

## 2019-03-23 DIAGNOSIS — Z79899 Other long term (current) drug therapy: Secondary | ICD-10-CM

## 2019-03-23 DIAGNOSIS — M8580 Other specified disorders of bone density and structure, unspecified site: Secondary | ICD-10-CM

## 2019-03-23 DIAGNOSIS — Z13220 Encounter for screening for lipoid disorders: Secondary | ICD-10-CM

## 2019-03-23 DIAGNOSIS — R002 Palpitations: Secondary | ICD-10-CM | POA: Diagnosis not present

## 2019-03-23 DIAGNOSIS — J320 Chronic maxillary sinusitis: Secondary | ICD-10-CM

## 2019-03-23 NOTE — Patient Instructions (Signed)
Calcium 300-600 mg daily   Weight bearing/high impact

## 2019-03-24 LAB — COMPLETE METABOLIC PANEL WITH GFR
AG Ratio: 1.8 (calc) (ref 1.0–2.5)
ALT: 20 U/L (ref 6–29)
AST: 22 U/L (ref 10–35)
Albumin: 4.2 g/dL (ref 3.6–5.1)
Alkaline phosphatase (APISO): 52 U/L (ref 37–153)
BUN: 18 mg/dL (ref 7–25)
CO2: 32 mmol/L (ref 20–32)
Calcium: 9.4 mg/dL (ref 8.6–10.4)
Chloride: 100 mmol/L (ref 98–110)
Creat: 0.86 mg/dL (ref 0.50–0.99)
GFR, Est African American: 81 mL/min/{1.73_m2} (ref >=60)
GFR, Est Non African American: 70 mL/min/{1.73_m2} (ref >=60)
Globulin: 2.3 g/dL (calc) (ref 1.9–3.7)
Glucose, Bld: 82 mg/dL (ref 65–99)
Potassium: 3.9 mmol/L (ref 3.5–5.3)
Sodium: 141 mmol/L (ref 135–146)
Total Bilirubin: 0.7 mg/dL (ref 0.2–1.2)
Total Protein: 6.5 g/dL (ref 6.1–8.1)

## 2019-03-24 LAB — CBC WITH DIFFERENTIAL/PLATELET
Absolute Monocytes: 417 cells/uL (ref 200–950)
Basophils Absolute: 59 cells/uL (ref 0–200)
Basophils Relative: 1.2 %
Eosinophils Absolute: 98 cells/uL (ref 15–500)
Eosinophils Relative: 2 %
HCT: 41.5 % (ref 35.0–45.0)
Hemoglobin: 13.9 g/dL (ref 11.7–15.5)
Lymphs Abs: 2264 cells/uL (ref 850–3900)
MCH: 32.4 pg (ref 27.0–33.0)
MCHC: 33.5 g/dL (ref 32.0–36.0)
MCV: 96.7 fL (ref 80.0–100.0)
MPV: 10.9 fL (ref 7.5–12.5)
Monocytes Relative: 8.5 %
Neutro Abs: 2063 cells/uL (ref 1500–7800)
Neutrophils Relative %: 42.1 %
Platelets: 267 10*3/uL (ref 140–400)
RBC: 4.29 10*6/uL (ref 3.80–5.10)
RDW: 13 % (ref 11.0–15.0)
Total Lymphocyte: 46.2 %
WBC: 4.9 10*3/uL (ref 3.8–10.8)

## 2019-03-24 LAB — LIPID PANEL
Cholesterol: 192 mg/dL (ref <200)
HDL: 100 mg/dL (ref >=50)
LDL Cholesterol (Calc): 78 mg/dL (calc)
Non-HDL Cholesterol (Calc): 92 mg/dL (calc) (ref <130)
Total CHOL/HDL Ratio: 1.9 (calc) (ref <5.0)
Triglycerides: 55 mg/dL (ref <150)

## 2019-03-24 LAB — HEMOGLOBIN A1C
Hgb A1c MFr Bld: 5.3 % of total Hgb (ref <5.7)
Mean Plasma Glucose: 105 (calc)
eAG (mmol/L): 5.8 (calc)

## 2019-03-24 LAB — URINALYSIS, ROUTINE W REFLEX MICROSCOPIC
Bilirubin Urine: NEGATIVE
Glucose, UA: NEGATIVE
Hgb urine dipstick: NEGATIVE
Ketones, ur: NEGATIVE
Leukocytes,Ua: NEGATIVE
Nitrite: NEGATIVE
Protein, ur: NEGATIVE
Specific Gravity, Urine: 1.019 (ref 1.001–1.03)
pH: 5.5 (ref 5.0–8.0)

## 2019-03-24 LAB — INSULIN, RANDOM: Insulin: 2.4 u[IU]/mL

## 2019-03-24 LAB — MICROALBUMIN / CREATININE URINE RATIO
Creatinine, Urine: 105 mg/dL (ref 20–275)
Microalb Creat Ratio: 3 mcg/mg creat (ref <30)
Microalb, Ur: 0.3 mg/dL

## 2019-03-24 LAB — TSH: TSH: 1.8 mIU/L (ref 0.40–4.50)

## 2019-03-24 LAB — MAGNESIUM: Magnesium: 1.7 mg/dL (ref 1.5–2.5)

## 2019-04-10 ENCOUNTER — Other Ambulatory Visit: Payer: Self-pay | Admitting: Internal Medicine

## 2019-04-10 MED ORDER — ONDANSETRON HCL 8 MG PO TABS: ORAL_TABLET | ORAL | 0 refills | Status: DC

## 2019-05-04 ENCOUNTER — Other Ambulatory Visit: Payer: Self-pay

## 2019-05-04 ENCOUNTER — Encounter: Payer: Self-pay | Admitting: Physician Assistant

## 2019-05-04 ENCOUNTER — Ambulatory Visit
Admission: RE | Admit: 2019-05-04 | Discharge: 2019-05-04 | Disposition: A | Discharge status: Home / Self Care | Payer: Medicare Other | Source: Ambulatory Visit | Attending: Physician Assistant | Admitting: Physician Assistant

## 2019-05-04 ENCOUNTER — Ambulatory Visit (INDEPENDENT_AMBULATORY_CARE_PROVIDER_SITE_OTHER): Payer: Medicare Other | Admitting: Physician Assistant

## 2019-05-04 VITALS — BP 130/72 | HR 66 | Temp 97.5°F | Wt 106.0 lb

## 2019-05-04 DIAGNOSIS — W19XXXA Unspecified fall, initial encounter: Secondary | ICD-10-CM | POA: Diagnosis not present

## 2019-05-04 DIAGNOSIS — M25562 Pain in left knee: Secondary | ICD-10-CM

## 2019-05-04 MED ORDER — IBUPROFEN 800 MG PO TABS: 800.0000 mg | ORAL_TABLET | Freq: Three times a day (TID) | ORAL | 0 refills | Status: DC | PRN

## 2019-05-04 NOTE — Patient Instructions (Signed)
INFORMATION ABOUT YOUR XRAY  Can walk into [address] for an Personal assistantxray. They will have the order and take you back. You do not any paper work, I should get the result back today or tomorrow. This order is good for a year.  Can call [phone] to schedule an appointment if you wish.    Patellar Tendinitis/bursitis  Patellar tendinitis is also called jumper's knee or patellar tendinopathy. This condition happens when there is damage to and inflammation of the patellar tendon. Tendons are cord-like tissues that connect muscles to bones. The patellar tendon connects the bottom of the kneecap (patella) to the top of the shin bone (tibia). Patellar tendinitis causes pain in the front of the knee. The condition happens in the following stages:  Stage 1: In this stage, you have pain only after activity.  Stage 2: In this stage, you have pain during and after activity.  Stage 3: In this stage, you have pain at rest as well as during and after activity. The pain limits your ability to do the activity.  Stage 4: In this stage, the tendon tears and severely limits your activity. What are the causes? This condition is caused by repeated (repetitive) stress on the tendon. This stress may cause the tendon to stretch, swell, thicken, or tear. What increases the risk? The following factors may make you more likely to develop this condition:  Participating in sports that involve running, kicking, and jumping, especially on hard surfaces. These include: ? Basketball. ? Volleyball. ? Soccer. ? Track and field.  Training too hard.  Having tight thigh muscles.  Having received steroid injections in the tendon.  Having had knee surgery.  Being 2416-68 years old.  Having rheumatoid arthritis, diabetes, or kidney disease. These conditions interrupt blood flow to the knee, causing the tendon to weaken. What are the signs or symptoms? The main symptom of this condition is pain and swelling in  the front of the knee. The pain usually starts slowly and gradually gets worse. It may become painful to straighten your leg. The pain may get worse when you walk, run, or jump. How is this diagnosed? This condition may be diagnosed based on:  Your symptoms.  Your medical history.  A physical exam. During the physical exam, your health care provider may check for: ? Tenderness in your patella. ? Tightness in your thigh muscles. ? Pain when you straighten your knee.  Imaging tests, including: ? X-rays. These will show the position and condition of your patella. ? An MRI. This will show any abnormality of the tendon. ? Ultrasound. This will show any swelling or other abnormalities of the tendon. How is this treated? Treatment for this condition depends on the stage of the condition. It may involve:  Avoiding activities that cause pain, such as jumping.  Icing and elevating your knee.  Having sound wave stimulation to promote healing.  Doing stretching and strengthening exercises (physical therapy) when pain and swelling improve.  Wearing a knee brace. This may be needed if your condition does not improve with treatment.  Using crutches or a walker. This may be needed if your condition does not improve with treatment.  Surgery. This may be done if you have stage 4 tendinitis. Follow these instructions at home: If you have a brace:  Wear the brace as told by your health care provider. Remove it only as told by your health care provider.  Loosen the brace if your toes tingle, become numb, or turn  cold and blue.  Keep the brace clean.  If the brace is not waterproof: ? Do not let it get wet. ? Cover it with a watertight covering when you take a bath or shower.  Ask your health care provider when it is safe for you to drive. Managing pain, stiffness, and swelling   If directed, put ice on the injured area. ? If you have a removable brace, remove it as told by your health  care provider. ? Put ice in a plastic bag. ? Place a towel between your skin and the bag. ? Leave the ice on for 20 minutes, 2-3 times a day.  Move your toes often to reduce stiffness and swelling.  Raise (elevate) your knee above the level of your heart while you are sitting or lying down. Activity  Do not use the injured limb to support your body weight until your health care provider says that you can. Use your crutches or a walker as told by your health care provider.  Return to your normal activities as told by your health care provider. Ask your health care provider what activities are safe for you.  Do exercises as told by your health care provider or physical therapist. General instructions  Take over-the-counter and prescription medicines only as told by your health care provider.  Do not use any products that contain nicotine or tobacco, such as cigarettes, e-cigarettes, and chewing tobacco. These can delay healing. If you need help quitting, ask your health care provider.  Keep all follow-up visits as told by your health care provider. This is important. How is this prevented?  Warm up and stretch before being active.  Cool down and stretch after being active.  Give your body time to rest between periods of activity. ? You may need to reduce how often you play a sport that requires frequent jumping.  Make sure to use equipment that fits you.  Be safe and responsible while being active. This will help you avoid falls which can damage the tendon.  Do at least 150 minutes of moderate-intensity exercise each week, such as brisk walking or water aerobics.  Maintain physical fitness, including: ? Strength. ? Flexibility. ? Cardiovascular fitness. ? Endurance. Contact a health care provider if:  Your symptoms have not improved in 6 weeks.  Your symptoms get worse. Summary  Patellar tendinitis is also called jumper's knee or patellar tendinopathy. This condition  happens when there is damage to and inflammation of the patellar tendon.  Treatment for this condition depends on the stage of the condition and may include rest, ice, exercises, medicines, and surgery.  Do not use the injured limb to support your body weight until your health care provider says that you can.  Take over-the-counter and prescription medicines only as told by your health care provider.  Keep all follow-up visits as told by your health care provider. This is important. This information is not intended to replace advice given to you by your health care provider. Make sure you discuss any questions you have with your health care provider. Document Released: 06/01/2005 Document Revised: 09/22/2018 Document Reviewed: 04/25/2018   Patellar Fracture, Adult  A patellar fracture is a break in the kneecap (patella). The patella is located on the front of the knee. A patellar fracture may make it difficult to walk. What are the causes? This condition may be caused by:  A fall or a hard, direct hit (blow) to the knee.  A very hard and strong  bending of the knee. What increases the risk? The following factors make you more likely to experience a patellar fracture:  Playing contact sports or motor sports, especially sports that involve a lot of jumping.  Having bone abnormalities or diseases of the bone, such as osteoporosis or a bone tumor.  Having poor strength and flexibility.  Having metabolism disorders, hormone problems, or nutrition deficiencies and disorders, such as anorexia or bulimia. What are the signs or symptoms? Symptoms of this condition include:  Tender and swollen knee.  Pain when moving the knee, especially when straightening it.  Difficulty walking or using the knee to support body weight (bearing weight).  Misshapen knee, as if a bone is out of place. How is this diagnosed? This condition is diagnosed based on:  Your symptoms and medical history.   A physical exam.  X-rays. How is this treated? Treatment for this condition depends on the type of fracture that you have:  If your patella is still in the right position after the fracture and you can still straighten your leg, you may need to wear a splint or cast for 4-6 weeks.  If your patella is broken into multiple pieces but you are able to straighten your leg, you can usually be treated with a splint or cast for 4-6 weeks. In some cases, the patella may need to be removed before the cast is applied.  If you cannot straighten your leg after a patellar fracture, you will need to have surgery to hold the patella together until it heals. After surgery, a splint or cast will be applied for 4-6 weeks.  You may be prescribed medicine to help relieve pain or prevent infection. Follow these instructions at home: If you have a splint:  Wear the splint as told by your health care provider. Remove it only as told by your health care provider.  Loosen the splint if your toes tingle, become numb, or turn cold and blue.  Keep the splint clean.  If the splint is not waterproof: ? Do not let it get wet. ? Cover it with a watertight covering when you take a bath or a shower. If you have a cast:  Do not stick anything inside the cast to scratch your skin. Doing that increases your risk of infection.  Check the skin around the cast every day. Tell your health care provider about any concerns.  You may put lotion on dry skin around the edges of the cast. Do not put lotion on the skin underneath the cast.  Keep the cast clean.  If the cast is not waterproof: ? Do not let it get wet. ? Cover it with a watertight covering when you take a bath or a shower. Managing pain, stiffness, and swelling   If directed, put ice on the injured area: ? If you have a removable splint, remove it as told by your health care provider. ? Put ice in a plastic bag. ? Place a towel between your skin and the  bag or between your cast and the bag. ? Leave the ice on for 20 minutes, 2-3 times a day.  Move your toes often to avoid stiffness and to lessen swelling.  Raise (elevate) the injured area above the level of your heart while you are sitting or lying down. Medicines  Take over-the-counter and prescription medicines only as told by your health care provider.  If you were prescribed an antibiotic medicine, use it as told by your health care provider.  Do not stop taking the antibiotic even if you start to feel better. Activity  Return to your normal activities as told by your health care provider. Ask your health care provider what activities are safe for you.  Do exercises as told by your health care provider.  Ask your health care provider when it is safe to drive if you have a splint or cast on your leg.  Do not drive or use heavy machinery while taking prescription pain medicine. General instructions  Do not use the injured limb to support your body weight until your health care provider says that you can. Use crutches as told by your health care provider.  Do not use any products that contain nicotine or tobacco, such as cigarettes and e-cigarettes. These can delay bone healing. If you need help quitting, ask your health care provider.  Keep all follow-up visits as told by your health care provider. This is important. Contact a health care provider if:  You have symptoms that get worse or do not get better after 2 weeks of treatment.  You have severe, persistent pain. Get help right away if:  You have redness, swelling, or increasing pain in your knee.  You have a fever.  You have blue or gray skin below the fracture site or in the toes.  You have numbness or loss of feeling below the fracture site. This information is not intended to replace advice given to you by your health care provider. Make sure you discuss any questions you have with your health care provider.  Document Released: 02/28/2003 Document Revised: 05/14/2017 Document Reviewed: 03/13/2016 Elsevier Patient Education  2020 [employer] Patient Education  [employer].

## 2019-05-04 NOTE — Progress Notes (Signed)
Subjective:    Patient ID: Alexis Weaver, female    DOB: 1951/01/24, 68 y.o.   MRN: [id number]  HPI 68 y.o. WF with history of osteoporosis presents s/p fall last night on left knee and right arm.  Did not hit head, no LOC, mechanical fall in dark room onto hardwood floor.  Right arm has some discomfort but her left knee woke her up with pain at 1 am. She has been using ice, elevating, and has taken 600 mg ibuprofen last night only. She states it is hard to straighten out her left knee, she feels a "popping" sensation when she walks. She can bear weight on it this morning.   Blood pressure 130/72, pulse 66, temperature (!) 97.5 F (36.4 C), weight 106 lb (48.1 kg), SpO2 96 %.  Medications  Current Outpatient Medications (Endocrine & Metabolic):  .  estradiol (VIVELLE-DOT) 0.05 MG/24HR patch, Apply 1 patch 2 x /week .  predniSONE (DELTASONE) 10 MG tablet, TAKE 1 TABLET BY MOUTH DAILY FOR 5 DAYS ONCE A MONTH  Current Outpatient Medications (Cardiovascular):  .  triamterene-hydrochlorothiazide (MAXZIDE) 75-50 MG tablet, Take 1 tablet Daily for BP & Fluid Retention  Current Outpatient Medications (Respiratory):  Marland Kitchen  ADVAIR DISKUS 250-50 MCG/DOSE AEPB, inhale 1 puff by mouth twice a day if needed for shortness of breath .  loratadine (CLARITIN) 10 MG tablet, Take 10 mg by mouth daily.  Current Outpatient Medications (Analgesics):  .  almotriptan (AXERT) 12.5 MG tablet, - Use stat for Migraine & may repeat 1 x / day in 2 hours if needed .  aspirin EC 81 MG tablet, Take 81 mg by mouth daily. Marland Kitchen  eletriptan (RELPAX) 40 MG tablet, TAKE 1 TABLET BY MOUTH IMMEDIATELY IF NEEDED FOR HEADACHE   Current Outpatient Medications (Other):  Marland Kitchen  Cholecalciferol (VITAMIN D3) 125 MCG (5000 UT) CAPS, Take by mouth daily. Marland Kitchen  conjugated estrogens (PREMARIN) vaginal cream, Place 1 Applicatorful vaginally daily. .  cycloSPORINE (RESTASIS) 0.05 % ophthalmic emulsion, Place 1 drop into both eyes 2 (two) times  daily.   Marland Kitchen  dextroamphetamine (DEXEDRINE SPANSULE) 10 MG 24 hr capsule, Take 10 mg by mouth daily.  Marland Kitchen  LATISSE 0.03 % ophthalmic solution, PLACE 1 DROP ON APPLICATOR AND APPLY EVENLY ALONG SKIN OF UPPER EYELIDS AT BASE OF EYELASHES ONCE DAILY AT BEDTIME .  linaclotide (LINZESS) 72 MCG capsule, Take 1 capsule Daily for IBS-C .  magnesium gluconate (MAGONATE) 500 MG tablet, Take 500 mg by mouth daily. .  Milnacipran HCl (SAVELLA) 100 MG TABS tablet, Take 2 tablets  /Daily for Mood .  ondansetron (ZOFRAN) 8 MG tablet, Take1 tablet 2 to 3 x /day as needed for Nausea / Emesis .  Sennosides (SENOKOT PO), Take 1 tablet by mouth as needed.  .  traZODone (DESYREL) 50 MG tablet, Take 25 mg by mouth at bedtime.  Problem list She has Palpitations; Asthma, chronic; Chronic sinusitis; Anxiety; Major depression in partial remission (HCC); Osteopenia; Prediabetes; Medication management; IBS (irritable bowel syndrome)-constipation type; Bilateral shoulder pain; Vitamin D deficiency; and Labile hypertension on their problem list.   Review of Systems  Constitutional: Negative.   HENT: Negative.   Respiratory: Negative.   Cardiovascular: Negative.   Gastrointestinal: Negative.   Musculoskeletal: Positive for arthralgias, gait problem and joint swelling. Negative for back pain, myalgias, neck pain and neck stiffness.  Skin: Negative.        Objective:   Physical Exam Constitutional:      Appearance: She is well-developed.  Cardiovascular:     Rate and Rhythm: Normal rate and regular rhythm.  Pulmonary:     Effort: Pulmonary effort is normal.     Breath sounds: Normal breath sounds.  Abdominal:     General: Bowel sounds are normal.     Palpations: Abdomen is soft.  Musculoskeletal: Normal range of motion.     Comments: Patient is not able to ambulate well.   Gait is  antalgic. left knee with effusion, erythema, tenderness noted at anterior patella, ecchymosis noted on patella with effusion and  limited knee extension due to pain, ACL stable, MCL stable, LCL stable and no patellar laxity Normal distal neurovascular exam.   Skin:    General: Skin is warm and dry.     Findings: No rash.  Neurological:     Mental Status: She is alert and oriented to person, place, and time.       Assessment & Plan:   Acute pain of left knee -     DG Knee Complete 4 Views Left; Future -     ibuprofen (ADVIL) 800 MG tablet; Take 1 tablet (800 mg total) by mouth every 8 (eight) hours as needed for moderate pain (take for 2 weeks with food, can take with tylenol). - patient unable to walk well, unable to extend knee fully, will get Xray rule out fracture with exam and osteoporosis history likely bursitis/sprain Use crutches, ice, rest, elevate, and use NSAIDS If not better may repeat Xray 7-10 days or refer to ortho for evaluation- wishes to see Dr. Selinda Eon, initial encounter -     DG Knee Complete 4 Views Left; Future

## 2019-05-26 ENCOUNTER — Other Ambulatory Visit: Payer: Self-pay

## 2019-05-26 ENCOUNTER — Encounter: Payer: Self-pay | Admitting: Physician Assistant

## 2019-05-26 ENCOUNTER — Other Ambulatory Visit: Payer: Self-pay | Admitting: Physician Assistant

## 2019-05-26 ENCOUNTER — Ambulatory Visit
Admission: RE | Admit: 2019-05-26 | Discharge: 2019-05-26 | Disposition: A | Discharge status: Home / Self Care | Payer: Medicare Other | Source: Ambulatory Visit | Attending: Physician Assistant | Admitting: Physician Assistant

## 2019-05-26 ENCOUNTER — Ambulatory Visit (INDEPENDENT_AMBULATORY_CARE_PROVIDER_SITE_OTHER): Payer: Medicare Other | Admitting: Physician Assistant

## 2019-05-26 VITALS — BP 130/80 | HR 74 | Temp 97.5°F | Ht 61.0 in | Wt 108.0 lb

## 2019-05-26 DIAGNOSIS — M25562 Pain in left knee: Secondary | ICD-10-CM

## 2019-05-26 NOTE — Patient Instructions (Signed)
INFORMATION ABOUT YOUR XRAY  Can walk into [address] for an Insurance account manager. They will have the order and take you back. You do not any paper work, I should get the result back today or tomorrow. This order is good for a year.  Can call [phone] to schedule an appointment if you wish.    Patellar Fracture, Adult  A patellar fracture is a break in the kneecap (patella). The patella is located on the front of the knee. A patellar fracture may make it difficult to walk. What are the causes? This condition may be caused by:  A fall or a hard, direct hit (blow) to the knee.  A very hard and strong bending of the knee. What increases the risk? The following factors make you more likely to experience a patellar fracture:  Playing contact sports or motor sports, especially sports that involve a lot of jumping.  Having bone abnormalities or diseases of the bone, such as osteoporosis or a bone tumor.  Having poor strength and flexibility.  Having metabolism disorders, hormone problems, or nutrition deficiencies and disorders, such as anorexia or bulimia. What are the signs or symptoms? Symptoms of this condition include:  Tender and swollen knee.  Pain when moving the knee, especially when straightening it.  Difficulty walking or using the knee to support body weight (bearing weight).  Misshapen knee, as if a bone is out of place. How is this diagnosed? This condition is diagnosed based on:  Your symptoms and medical history.  A physical exam.  X-rays. How is this treated? Treatment for this condition depends on the type of fracture that you have:  If your patella is still in the right position after the fracture and you can still straighten your leg, you may need to wear a splint or cast for 4-6 weeks.  If your patella is broken into multiple pieces but you are able to straighten your leg, you can usually be treated with a splint or cast for 4-6 weeks. In some cases,  the patella may need to be removed before the cast is applied.  If you cannot straighten your leg after a patellar fracture, you will need to have surgery to hold the patella together until it heals. After surgery, a splint or cast will be applied for 4-6 weeks.  You may be prescribed medicine to help relieve pain or prevent infection. Follow these instructions at home: If you have a splint:  Wear the splint as told by your health care provider. Remove it only as told by your health care provider.  Loosen the splint if your toes tingle, become numb, or turn cold and blue.  Keep the splint clean.  If the splint is not waterproof: ? Do not let it get wet. ? Cover it with a watertight covering when you take a bath or a shower. If you have a cast:  Do not stick anything inside the cast to scratch your skin. Doing that increases your risk of infection.  Check the skin around the cast every day. Tell your health care provider about any concerns.  You may put lotion on dry skin around the edges of the cast. Do not put lotion on the skin underneath the cast.  Keep the cast clean.  If the cast is not waterproof: ? Do not let it get wet. ? Cover it with a watertight covering when you take a bath or a shower. Managing pain, stiffness, and swelling   If directed, put  ice on the injured area: ? If you have a removable splint, remove it as told by your health care provider. ? Put ice in a plastic bag. ? Place a towel between your skin and the bag or between your cast and the bag. ? Leave the ice on for 20 minutes, 2-3 times a day.  Move your toes often to avoid stiffness and to lessen swelling.  Raise (elevate) the injured area above the level of your heart while you are sitting or lying down. Medicines  Take over-the-counter and prescription medicines only as told by your health care provider.  If you were prescribed an antibiotic medicine, use it as told by your health care  provider. Do not stop taking the antibiotic even if you start to feel better. Activity  Return to your normal activities as told by your health care provider. Ask your health care provider what activities are safe for you.  Do exercises as told by your health care provider.  Ask your health care provider when it is safe to drive if you have a splint or cast on your leg.  Do not drive or use heavy machinery while taking prescription pain medicine. General instructions  Do not use the injured limb to support your body weight until your health care provider says that you can. Use crutches as told by your health care provider.  Do not use any products that contain nicotine or tobacco, such as cigarettes and e-cigarettes. These can delay bone healing. If you need help quitting, ask your health care provider.  Keep all follow-up visits as told by your health care provider. This is important. Contact a health care provider if:  You have symptoms that get worse or do not get better after 2 weeks of treatment.  You have severe, persistent pain. Get help right away if:  You have redness, swelling, or increasing pain in your knee.  You have a fever.  You have blue or gray skin below the fracture site or in the toes.  You have numbness or loss of feeling below the fracture site. This information is not intended to replace advice given to you by your health care provider. Make sure you discuss any questions you have with your health care provider. Document Released: 02/28/2003 Document Revised: 05/14/2017 Document Reviewed: 03/13/2016 Elsevier Patient Education  2020 [employer]   The testing sites are open from 10-3, Monday-Friday. Due to the testing being walk-up/drive-up the sites request that the pt's are in line to have testing by 3:00 pm. The pt's will remain in the car and wear a mask when going for testing.The staff will come to the car to perform testing. The pt's will need to  bring an ID. People are getting results in 24-72 hours.  During this time please quarantine you and your family.   [place] [facility])  [place]- [address]  -[address] [facility]   The testing sites are also listed on the [facility] Website as well.   To stop home isolation IF you test positive, you will need all three of the statements below.  1) You need to be fever free for 3 days WITHOUT tylenol.  2) Your symptoms such as cough, shortness of breath, diarrhea, etc need to improve.  3) It needs to be at least 7 days since your symptoms first appeared    After you stop home isolation, please continue to wear a mask in  public and continue hand washing and contact precautions.   Things to do if you test positive: Please do vitamin C 1000mg  twice a day- stop if you have any worsening heart burn or diarrhea.   You can take tylenol for fevers above 101 if you feel uncomfortable. Remember a fever itself is not bad, the tylenol is more for your comfort. See the max of tylenol below.  Please do breathing exercises. Make sure that you are taking deep breaths and trying to walk around the room as much as possible.  Please lay "prone" or on your belly for up to 4 hours a day, you can split this up to 30 minute or 1 hour increments but this helps get oxygen to certain places of your lungs.  Please get on 81 mg of aspirin unless you are unable to tolerate this or have a contraindication.  Please pump your feet like your are pedaling a bike to prevent clots in your legs.   Push fluids with Gatorade or electrolyte replacement in addition to water, aim for 100-120 oz a day if no contraindications.  If you have worsening shortness of breath, unable to complete full sentences, are panting, please contact the office immediately for call 911. Let them know you are being monitored for coronavirus.

## 2019-05-26 NOTE — Progress Notes (Signed)
Subjective:    Patient ID: Alexis Weaver, female    DOB: Mar 11, 1951, 68 y.o.   MRN: [id number]  HPI 68 y.o. WF with history of osteoporosis presents for follow up of left knee s/p fall on 05/03/19 She had a negative patella xray for fracture 11/19. She was prescribed advil, rest, ice. States it is still hurting, very painful when she touches her patella however it feels "numb" too, pain with walking, able to bear weight. She can still feel a "popping" sensation when she walks like "something is moving"  Blood pressure 130/80, pulse 74, temperature (!) 97.5 F (36.4 C), height 5\' 1"  (1.549 m), weight 108 lb (49 kg), SpO2 96 %.  Medications  Current Outpatient Medications (Endocrine & Metabolic):  .  estradiol (VIVELLE-DOT) 0.05 MG/24HR patch, Apply 1 patch 2 x /week .  predniSONE (DELTASONE) 10 MG tablet, TAKE 1 TABLET BY MOUTH DAILY FOR 5 DAYS ONCE A MONTH  Current Outpatient Medications (Cardiovascular):  .  triamterene-hydrochlorothiazide (MAXZIDE) 75-50 MG tablet, Take 1 tablet Daily for BP & Fluid Retention  Current Outpatient Medications (Respiratory):  Marland Kitchen  ADVAIR DISKUS 250-50 MCG/DOSE AEPB, inhale 1 puff by mouth twice a day if needed for shortness of breath .  loratadine (CLARITIN) 10 MG tablet, Take 10 mg by mouth daily.  Current Outpatient Medications (Analgesics):  .  almotriptan (AXERT) 12.5 MG tablet, - Use stat for Migraine & may repeat 1 x / day in 2 hours if needed .  aspirin EC 81 MG tablet, Take 81 mg by mouth daily. Marland Kitchen  eletriptan (RELPAX) 40 MG tablet, TAKE 1 TABLET BY MOUTH IMMEDIATELY IF NEEDED FOR HEADACHE .  ibuprofen (ADVIL) 800 MG tablet, Take 1 tablet (800 mg total) by mouth every 8 (eight) hours as needed for moderate pain (take for 2 weeks with food, can take with tylenol).   Current Outpatient Medications (Other):  Marland Kitchen  Cholecalciferol (VITAMIN D3) 125 MCG (5000 UT) CAPS, Take by mouth daily. Marland Kitchen  conjugated estrogens (PREMARIN) vaginal cream, Place 1  Applicatorful vaginally daily. .  cycloSPORINE (RESTASIS) 0.05 % ophthalmic emulsion, Place 1 drop into both eyes 2 (two) times daily.   Marland Kitchen  dextroamphetamine (DEXEDRINE SPANSULE) 10 MG 24 hr capsule, Take 10 mg by mouth daily.  Marland Kitchen  LATISSE 0.03 % ophthalmic solution, PLACE 1 DROP ON APPLICATOR AND APPLY EVENLY ALONG SKIN OF UPPER EYELIDS AT BASE OF EYELASHES ONCE DAILY AT BEDTIME .  linaclotide (LINZESS) 72 MCG capsule, Take 1 capsule Daily for IBS-C .  magnesium gluconate (MAGONATE) 500 MG tablet, Take 500 mg by mouth daily. .  Milnacipran HCl (SAVELLA) 100 MG TABS tablet, Take 2 tablets  /Daily for Mood .  ondansetron (ZOFRAN) 8 MG tablet, Take1 tablet 2 to 3 x /day as needed for Nausea / Emesis .  Sennosides (SENOKOT PO), Take 1 tablet by mouth as needed.  .  traZODone (DESYREL) 50 MG tablet, Take 25 mg by mouth at bedtime.  Problem list She has Palpitations; Asthma, chronic; Chronic sinusitis; Anxiety; Major depression in partial remission ([place]); Osteopenia; Prediabetes; Medication management; IBS (irritable bowel syndrome)-constipation type; Bilateral shoulder pain; Vitamin D deficiency; and Labile hypertension on their problem list.   Review of Systems  Constitutional: Negative.   HENT: Negative.   Respiratory: Negative.   Cardiovascular: Negative.   Gastrointestinal: Negative.   Musculoskeletal: Positive for arthralgias, gait problem and joint swelling. Negative for back pain, myalgias, neck pain and neck stiffness.  Skin: Negative.  Objective:   Physical Exam Constitutional:      Appearance: She is well-developed.  Cardiovascular:     Rate and Rhythm: Normal rate and regular rhythm.  Pulmonary:     Effort: Pulmonary effort is normal.     Breath sounds: Normal breath sounds.  Abdominal:     General: Bowel sounds are normal.     Palpations: Abdomen is soft.  Musculoskeletal:        General: Normal range of motion.     Comments: Patient is not able to ambulate well.    Gait is antalgic. left knee with effusion, no erythema, no deformity, tenderness noted at anterior patella, full ROM left knee with extension and flexion, ACL stable, MCL stable, LCL stable and no patellar laxity Normal distal neurovascular exam.   Skin:    General: Skin is warm and dry.     Findings: No rash.  Neurological:     Mental Status: She is alert and oriented to person, place, and time.       Assessment & Plan:   Acute pain of left knee -     DG Knee Complete 4 Views Left; Future- will repeat xray to see if we can see a healing fracture due to continuing pain Possible meniscal tear Get salonpas patches for pain, continue NSAIDS -     ibuprofen (ADVIL) 800 MG tablet; Take 1 tablet (800 mg total) by mouth every 8 (eight) hours as needed for moderate pain (take for 2 weeks with food, can take with tylenol). Use crutches, ice, rest, elevate, and use NSAIDS   ortho for evaluation-  Dr. Charlann Boxer

## 2019-06-22 LAB — HM MAMMOGRAPHY

## 2019-06-26 ENCOUNTER — Ambulatory Visit: Payer: Medicare Other | Attending: Internal Medicine

## 2019-06-26 DIAGNOSIS — Z20822 Contact with and (suspected) exposure to covid-19: Secondary | ICD-10-CM

## 2019-06-27 LAB — NOVEL CORONAVIRUS, NAA: SARS-CoV-2, NAA: NOT DETECTED

## 2019-06-29 ENCOUNTER — Encounter: Payer: Self-pay | Admitting: *Deleted

## 2019-07-13 ENCOUNTER — Other Ambulatory Visit: Payer: Self-pay | Admitting: Internal Medicine

## 2019-07-14 ENCOUNTER — Other Ambulatory Visit: Payer: Self-pay | Admitting: Internal Medicine

## 2019-07-14 MED ORDER — PREDNISONE 10 MG PO TABS: ORAL_TABLET | ORAL | 0 refills | Status: DC

## 2019-07-28 ENCOUNTER — Other Ambulatory Visit: Payer: Self-pay | Admitting: *Deleted

## 2019-07-28 MED ORDER — SAVELLA 100 MG PO TABS: ORAL_TABLET | ORAL | 1 refills | Status: DC

## 2019-09-26 ENCOUNTER — Encounter: Payer: Self-pay | Admitting: Internal Medicine

## 2019-09-26 ENCOUNTER — Ambulatory Visit: Payer: Medicare Other | Admitting: Internal Medicine

## 2019-09-26 NOTE — Progress Notes (Addendum)
NO SHOW

## 2019-10-09 ENCOUNTER — Encounter: Payer: Self-pay | Admitting: Internal Medicine

## 2019-10-09 NOTE — Progress Notes (Signed)
History of Present Illness:      This very nice 69 y.o. MWF presents for 6 month follow up with HTN, HLD, Pre-Diabetes and Vitamin D Deficiency.        Patient presents with multiple concerns today.  She discussed her migraines using her Relpax more frequently than her Axert. Relates her Migraines to weather triggers and also Stress. . Reports usually 3 - 10 migraines / month - usually aborts with meds if catches early. Usually starts with Nausea & Zofran frequently aborts.  Next both eye may become "pounding" and vision becomes blurred. No other visual aura. Next pounding in back of neck. Patient also has hx/o Raynaud's with negative connective tissue w/u in the past.               Patient is followed expectantly monitoring for labile HTN & BP has been controlled at home on periodic Maxide. Today's BP  is elevated at 148/96 and rechecked at 146/76. Patient has had no complaints of any cardiac type chest pain, palpitations, dyspnea / orthopnea / PND, dizziness, claudication, or dependent edema.      Hyperlipidemia is controlled with diet. Last Lipids were at goal:  Lab Results  Component Value Date   CHOL 192 03/23/2019   HDL 100 03/23/2019   LDLCALC 78 03/23/2019   TRIG 55 03/23/2019   CHOLHDL 1.9 03/23/2019    Also, the patient has history of PreDiabetes (A1c 5.9% / 2015 and 6.0% / 2016) and has had no symptoms of reactive hypoglycemia, diabetic polys, paresthesias or visual blurring.  Last A1c was Normal & at goal:  Lab Results  Component Value Date   HGBA1C 5.3 03/23/2019           Further, the patient also has history of Vitamin D Deficiency ("40" / 2016)  and supplements vitamin D without any suspected side-effects. Last vitamin D was at goal:  Lab Results  Component Value Date   VD25OH 87 08/30/2018    Current Outpatient Medications on File Prior to Visit  Medication Sig  . ADVAIR DISKUS 250-50 MCG/DOSE AEPB inhale 1 puff by mouth twice a day if needed for  shortness of breath  . almotriptan (AXERT) 12.5 MG tablet - Use stat for Migraine & may repeat 1 x / day in 2 hours if needed  . aspirin EC 81 MG tablet Take 81 mg by mouth daily.  . Cholecalciferol (VITAMIN D3) 125 MCG (5000 UT) CAPS Take by mouth daily.  Marland Kitchen conjugated estrogens (PREMARIN) vaginal cream Place 1 Applicatorful vaginally daily.  . cycloSPORINE (RESTASIS) 0.05 % ophthalmic emulsion Place 1 drop into both eyes 2 (two) times daily.    Marland Kitchen dextroamphetamine (DEXEDRINE SPANSULE) 10 MG 24 hr capsule Take 10 mg by mouth daily.   Marland Kitchen eletriptan (RELPAX) 40 MG tablet Take 1 tablet for Migraine & may repeat 1 x in 2 hours (Maximum 2 tabs /24 hours)  . estradiol (VIVELLE-DOT) 0.05 MG/24HR patch Apply 1 patch 2 x /week  . fexofenadine (ALLEGRA) 180 MG tablet Take 180 mg by mouth daily.  Marland Kitchen ibuprofen (ADVIL) 800 MG tablet Take 1 tablet (800 mg total) by mouth every 8 (eight) hours as needed for moderate pain (take for 2 weeks with food, can take with tylenol).  Marland Kitchen LATISSE 0.03 % ophthalmic solution PLACE 1 DROP ON APPLICATOR AND APPLY EVENLY ALONG SKIN OF UPPER EYELIDS AT BASE OF EYELASHES ONCE DAILY AT BEDTIME  . linaclotide (LINZESS) 72 MCG capsule Take 1  capsule Daily for IBS-C  . magnesium gluconate (MAGONATE) 500 MG tablet Take 500 mg by mouth daily.  . Milnacipran HCl (SAVELLA) 100 MG TABS tablet Take 2 tablets  /Daily for Mood  . ondansetron (ZOFRAN) 8 MG tablet Take1 tablet 2 to 3 x /day as needed for Nausea / Emesis  . predniSONE (DELTASONE) 10 MG tablet Take 1/2 to 1 tablet Daily as Directed  . Sennosides (SENOKOT PO) Take 1 tablet by mouth as needed.   . traZODone (DESYREL) 50 MG tablet Take 25 mg by mouth at bedtime.  . triamterene-hydrochlorothiazide (MAXZIDE) 75-50 MG tablet Take 1 tablet Daily for BP & Fluid Retention   No current facility-administered medications on file prior to visit.    Allergies  Allergen Reactions  . Hydrocodone-Acetaminophen Shortness Of Breath  . Codeine  Hives  . Morphine And Related Hives  . Nitrofurantoin Other (See Comments)    Makes hands and feet burn    PMHx:   Past Medical History:  Diagnosis Date  . Allergy   . Anemia 2016  . Anxiety   . Asthma   . Chronic headaches   . Chronic sinusitis   . Depression   . Fibromyalgia   . GERD (gastroesophageal reflux disease)   . IBS (irritable bowel syndrome)   . Meckel diverticulum 1997  . Meckel's diverticulum   . Neuromuscular disorder ([place])    fibromyagia  . Osteopenia     Immunization History  Administered Date(s) Administered  . DT (Pediatric) 11/06/2004  . Influenza, High Dose Seasonal PF 03/23/2019  . Influenza-Unspecified 02/14/2012  . Pneumococcal Conjugate-13 10/26/2017  . Pneumococcal Polysaccharide-23 03/23/2019  . Tdap 02/26/2011    Past Surgical History:  Procedure Laterality Date  . ABDOMINAL HYSTERECTOMY     tubes/ovaries spared  . APPENDECTOMY  1997  . BREAST ENHANCEMENT SURGERY    . [place]   x 2  . HEMORRHOID BANDING    . LAPAROSCOPIC ABDOMINAL EXPLORATION     peritonitis, appendectomy    FHx:    Reviewed / unchanged  SHx:    Reviewed / unchanged   Systems Review:  Constitutional: Denies fever, chills, wt changes, headaches, insomnia, fatigue, night sweats, change in appetite. Eyes: Denies redness, blurred vision, diplopia, discharge, itchy, watery eyes.  ENT: Denies discharge, congestion, post nasal drip, epistaxis, sore throat, earache, hearing loss, dental pain, tinnitus, vertigo, sinus pain, snoring.  CV: Denies chest pain, palpitations, irregular heartbeat, syncope, dyspnea, diaphoresis, orthopnea, PND, claudication or edema. Respiratory: denies cough, dyspnea, DOE, pleurisy, hoarseness, laryngitis, wheezing.  Gastrointestinal: Denies dysphagia, odynophagia, heartburn, reflux, water brash, abdominal pain or cramps, nausea, vomiting, bloating, diarrhea, constipation, hematemesis, melena, hematochezia  or  hemorrhoids. Genitourinary: Denies dysuria, frequency, urgency, nocturia, hesitancy, discharge, hematuria or flank pain. Musculoskeletal: Denies arthralgias, myalgias, stiffness, jt. swelling, pain, limping or strain/sprain.  Skin: Denies pruritus, rash, hives, warts, acne, eczema or change in skin lesion(s). Neuro: No weakness, tremor, incoordination, spasms, paresthesia or pain. Psychiatric: Denies confusion, memory loss or sensory loss. Endo: Denies change in weight, skin or hair change.  Heme/Lymph: No excessive bleeding, bruising or enlarged lymph nodes.  Physical Exam  BP  148/96   P 80   T 97 F    R 16   Ht 5' 1.5"    Wt 111 lb    BMI 20.63   Recheck BP 146/76    P 84  Appears  well nourished, well groomed  and in no distress.  Eyes: PERRLA, EOMs, conjunctiva no  swelling or erythema. Sinuses: No frontal/maxillary tenderness ENT/Mouth: EAC's clear, TM's nl w/o erythema, bulging. Nares clear w/o erythema, swelling, exudates. Oropharynx clear without erythema or exudates. Oral hygiene is good. Tongue normal, non obstructing. Hearing intact.  Neck: Supple. Thyroid not palpable. Car 2+/2+ without bruits, nodes or JVD. Chest: Respirations nl with BS clear & equal w/o rales, rhonchi, wheezing or stridor.  Cor: Heart sounds normal w/ regular rate and rhythm without sig. murmurs, gallops, clicks or rubs. Peripheral pulses normal and equal  without edema.  Abdomen: Soft & bowel sounds normal. Non-tender w/o guarding, rebound, hernias, masses or organomegaly.  Lymphatics: Unremarkable.  Musculoskeletal: Full ROM all peripheral extremities, joint stability, 5/5 strength and normal gait.  Skin: Warm, dry without exposed rashes, lesions or ecchymosis apparent.  Neuro: Cranial nerves intact, reflexes equal bilaterally. Sensory-motor testing grossly intact. Tendon reflexes grossly intact.  Pysch: Alert & oriented x 3.  Insight and judgement nl & appropriate. No ideations.  Assessment and  Plan:  1. Labile hypertension  - Add Bisoprolol 5 mg  for HTN and Migraine prophylaxis.  - ROV 6 weeks to reassess  - Continue medication, monitor blood pressure at home.  - Continue DASH diet.  Reminder to go to the ER if any CP,  SOB, nausea, dizziness, severe HA, changes vision/speech.  - CBC with Differential/Platelet - COMPLETE METABOLIC PANEL WITH GFR - Magnesium - TSH  2. Hyperlipidemia, mixed  - Continue diet/meds, exercise,& lifestyle modifications.  - Continue monitor periodic cholesterol/liver & renal functions   - Lipid panel - TSH  3. Abnormal glucose  - Continue diet, exercise  - Lifestyle modifications.  - Monitor appropriate labs.  - Hemoglobin A1c - Insulin, random  4. Vitamin D deficiency  - Continue supplementation.  - VITAMIN D 25 Hydroxy  5. Medication management - CBC with Differential/Platelet - COMPLETE METABOLIC PANEL WITH GFR - Magnesium - Lipid panel - TSH - Hemoglobin A1c - Insulin, random - VITAMIN D 25 Hydroxy          Discussed  regular exercise, BP monitoring, weight control to achieve/maintain BMI less than 25 and discussed med and SE's. Recommended labs to assess and monitor clinical status with further disposition pending results of labs.  I discussed the assessment and treatment plan with the patient. The patient was provided an opportunity to ask questions and all were answered. The patient agreed with the plan and demonstrated an understanding of the instructions.  I provided over 30 minutes of exam, counseling, chart review and  complex critical decision making.   Marinus Maw, MD

## 2019-10-09 NOTE — Patient Instructions (Signed)

## 2019-10-10 ENCOUNTER — Other Ambulatory Visit: Payer: Self-pay

## 2019-10-10 ENCOUNTER — Ambulatory Visit (INDEPENDENT_AMBULATORY_CARE_PROVIDER_SITE_OTHER): Payer: Medicare Other | Admitting: Internal Medicine

## 2019-10-10 VITALS — BP 148/96 | HR 80 | Temp 97.0°F | Resp 16 | Ht 61.5 in | Wt 111.0 lb

## 2019-10-10 DIAGNOSIS — E559 Vitamin D deficiency, unspecified: Secondary | ICD-10-CM

## 2019-10-10 DIAGNOSIS — G43919 Migraine, unspecified, intractable, without status migrainosus: Secondary | ICD-10-CM

## 2019-10-10 DIAGNOSIS — R7309 Other abnormal glucose: Secondary | ICD-10-CM | POA: Diagnosis not present

## 2019-10-10 DIAGNOSIS — R0989 Other specified symptoms and signs involving the circulatory and respiratory systems: Secondary | ICD-10-CM

## 2019-10-10 DIAGNOSIS — E782 Mixed hyperlipidemia: Secondary | ICD-10-CM

## 2019-10-10 DIAGNOSIS — Z79899 Other long term (current) drug therapy: Secondary | ICD-10-CM

## 2019-10-10 MED ORDER — BISOPROLOL FUMARATE 5 MG PO TABS: ORAL_TABLET | ORAL | 1 refills | Status: DC

## 2019-10-11 LAB — COMPLETE METABOLIC PANEL WITH GFR
AG Ratio: 2 (calc) (ref 1.0–2.5)
ALT: 24 U/L (ref 6–29)
AST: 25 U/L (ref 10–35)
Albumin: 4.3 g/dL (ref 3.6–5.1)
Alkaline phosphatase (APISO): 46 U/L (ref 37–153)
BUN: 22 mg/dL (ref 7–25)
CO2: 30 mmol/L (ref 20–32)
Calcium: 9.4 mg/dL (ref 8.6–10.4)
Chloride: 102 mmol/L (ref 98–110)
Creat: 0.78 mg/dL (ref 0.50–0.99)
GFR, Est African American: 91 mL/min/{1.73_m2} (ref >=60)
GFR, Est Non African American: 78 mL/min/{1.73_m2} (ref >=60)
Globulin: 2.1 g/dL (calc) (ref 1.9–3.7)
Glucose, Bld: 94 mg/dL (ref 65–99)
Potassium: 4 mmol/L (ref 3.5–5.3)
Sodium: 138 mmol/L (ref 135–146)
Total Bilirubin: 0.7 mg/dL (ref 0.2–1.2)
Total Protein: 6.4 g/dL (ref 6.1–8.1)

## 2019-10-11 LAB — HEMOGLOBIN A1C
Hgb A1c MFr Bld: 5.4 % of total Hgb (ref <5.7)
Mean Plasma Glucose: 108 (calc)
eAG (mmol/L): 6 (calc)

## 2019-10-11 LAB — VITAMIN D 25 HYDROXY (VIT D DEFICIENCY, FRACTURES): Vit D, 25-Hydroxy: 100 ng/mL (ref 30–100)

## 2019-10-11 LAB — CBC WITH DIFFERENTIAL/PLATELET
Absolute Monocytes: 456 cells/uL (ref 200–950)
Basophils Absolute: 71 cells/uL (ref 0–200)
Basophils Relative: 1.5 %
Eosinophils Absolute: 122 cells/uL (ref 15–500)
Eosinophils Relative: 2.6 %
HCT: 42.6 % (ref 35.0–45.0)
Hemoglobin: 14.1 g/dL (ref 11.7–15.5)
Lymphs Abs: 1673 cells/uL (ref 850–3900)
MCH: 32.2 pg (ref 27.0–33.0)
MCHC: 33.1 g/dL (ref 32.0–36.0)
MCV: 97.3 fL (ref 80.0–100.0)
MPV: 10.6 fL (ref 7.5–12.5)
Monocytes Relative: 9.7 %
Neutro Abs: 2378 cells/uL (ref 1500–7800)
Neutrophils Relative %: 50.6 %
Platelets: 260 10*3/uL (ref 140–400)
RBC: 4.38 10*6/uL (ref 3.80–5.10)
RDW: 12.2 % (ref 11.0–15.0)
Total Lymphocyte: 35.6 %
WBC: 4.7 10*3/uL (ref 3.8–10.8)

## 2019-10-11 LAB — LIPID PANEL
Cholesterol: 192 mg/dL (ref <200)
HDL: 98 mg/dL (ref >=50)
LDL Cholesterol (Calc): 81 mg/dL (calc)
Non-HDL Cholesterol (Calc): 94 mg/dL (calc) (ref <130)
Total CHOL/HDL Ratio: 2 (calc) (ref <5.0)
Triglycerides: 53 mg/dL (ref <150)

## 2019-10-11 LAB — INSULIN, RANDOM: Insulin: 6.3 u[IU]/mL

## 2019-10-11 LAB — TSH: TSH: 1.57 mIU/L (ref 0.40–4.50)

## 2019-10-11 LAB — MAGNESIUM: Magnesium: 1.9 mg/dL (ref 1.5–2.5)

## 2019-10-12 ENCOUNTER — Ambulatory Visit: Payer: Medicare Other | Attending: Internal Medicine

## 2019-10-12 DIAGNOSIS — Z23 Encounter for immunization: Secondary | ICD-10-CM

## 2019-10-12 NOTE — Progress Notes (Signed)
   Covid-19 Vaccination Clinic  Name:  Alexis Weaver    MRN: [id number] DOB: February 05, 1951  10/12/2019  Alexis Weaver was observed post Covid-19 immunization for 15 minutes without incident. She was provided with Vaccine Information Sheet and instruction to access the V-Safe system.   Alexis Weaver was instructed to call 911 with any severe reactions post vaccine: Marland Kitchen Difficulty breathing  . Swelling of face and throat  . A fast heartbeat  . A bad rash all over body  . Dizziness and weakness   Immunizations Administered    Name Date Dose VIS Date Route   Pfizer COVID-19 Vaccine 10/12/2019 11:15 AM 0.3 mL 08/09/2018 Intramuscular   Manufacturer: [employer], [employer]   Lot: [id number]   NDC: [address]-8

## 2019-11-06 ENCOUNTER — Ambulatory Visit: Payer: Medicare Other | Attending: Internal Medicine

## 2019-11-06 DIAGNOSIS — Z23 Encounter for immunization: Secondary | ICD-10-CM

## 2019-11-06 NOTE — Progress Notes (Signed)
   Covid-19 Vaccination Clinic  Name:  FELICIE KOCHER    MRN: [id number] DOB: 20-Sep-1950  11/06/2019  Ms. Varnell was observed post Covid-19 immunization for 15 minutes without incident. She was provided with Vaccine Information Sheet and instruction to access the V-Safe system.   Ms. Kalisz was instructed to call 911 with any severe reactions post vaccine: Marland Kitchen Difficulty breathing  . Swelling of face and throat  . A fast heartbeat  . A bad rash all over body  . Dizziness and weakness   Immunizations Administered    Name Date Dose VIS Date Route   Pfizer COVID-19 Vaccine 11/06/2019 11:05 AM 0.3 mL 08/09/2018 Intramuscular   Manufacturer: [employer], [employer]   Lot: [id number]   NDC: [address]-3

## 2019-11-21 ENCOUNTER — Other Ambulatory Visit: Payer: Self-pay | Admitting: Internal Medicine

## 2019-11-21 MED ORDER — BIMATOPROST 0.03 % EX SOLN: CUTANEOUS | 11 refills | Status: DC

## 2019-11-23 ENCOUNTER — Ambulatory Visit: Payer: Medicare Other | Admitting: Internal Medicine

## 2020-01-19 ENCOUNTER — Other Ambulatory Visit: Payer: Self-pay | Admitting: Internal Medicine

## 2020-01-19 DIAGNOSIS — K581 Irritable bowel syndrome with constipation: Secondary | ICD-10-CM

## 2020-01-24 ENCOUNTER — Other Ambulatory Visit: Payer: Self-pay | Admitting: Internal Medicine

## 2020-01-24 MED ORDER — TRIAMTERENE-HCTZ 75-50 MG PO TABS: ORAL_TABLET | ORAL | 3 refills | Status: DC

## 2020-01-28 NOTE — Progress Notes (Signed)
Assessment and Plan: Alexis Weaver was seen today for follow-up.  Diagnoses and all orders for this visit:  Estradiol deficiency -     estradiol (VIVELLE-DOT) 0.05 MG/24HR patch; Apply 1 patch 2 x /week - will refill, only on 1 patch a week, and will be on bASA  B12 deficiency -     Vitamin B12- some fatigue, trouble concentrating, does not eat much meat, last check 2018 and was 459 at that time- will check today  Primary insomnia -     traZODone (DESYREL) 50 MG tablet; Take 1 tablet (50 mg total) by mouth at bedtime as needed for sleep. Put on my Dr. Evelene Croon with adderall for ADD- will check labs - ? With potential HTN if she wants to keep taking it- won't at this time Monitor BP at home  Anemia, unspecified type -     Iron,Total/Total Iron Binding Cap -     Ferritin - check with fatigue    HPI 69 y.o.female presents for 6 week follow up for HTN and migraine management.   Ziac was added for blood pressure management and as migraine prevention- she did not start it. She did not start it due to her raynauds and has been checking her BP at home has been 100-110's.   She has history of migraines, normally can help with relpax and can stop it. She will have migraines 1-2 a month, worse with weather and eating out.  She is on estrogen patch, only once a week low dose.  BP Readings from Last 20 Encounters:  01/29/20 122/84  10/10/19 (!) 148/96  05/26/19 130/80  05/04/19 130/72  03/23/19 118/70  08/30/18 134/84  08/03/18 110/76  03/01/18 118/76  12/08/17 104/60  10/26/17 124/68  07/08/17 128/82  07/02/17 112/82  03/23/17 110/68  02/23/17 124/68  12/04/16 126/72  11/10/16 128/72  08/24/16 136/62  04/30/16 108/60  04/29/16 132/79  03/06/16 114/60   She has seen a counselor for 16 years, she has seen Dr. Evelene Croon in the past, and she was on trazodone at night and  She  has a past surgical history that includes Abdominal hysterectomy; Cesarean section (1979, 1983); Breast  enhancement surgery; Laparoscopic abdominal exploration; Hemorrhoid banding; and Appendectomy (1997).   Patient Active Problem List   Diagnosis Date Noted   Labile hypertension 07/07/2017   Prediabetes 06/26/2014   Medication management 06/26/2014   IBS (irritable bowel syndrome)-constipation type 06/26/2014   Vitamin D deficiency 06/26/2014   Asthma, chronic    Chronic sinusitis    Anxiety    Major depression in partial remission (HCC)    Osteopenia    Palpitations 04/01/2010     Current Outpatient Medications (Endocrine & Metabolic):    estradiol (VIVELLE-DOT) 0.05 MG/24HR patch, Apply 1 patch 2 x /week   predniSONE (DELTASONE) 10 MG tablet, Take 1/2 to 1 tablet Daily as Directed  Current Outpatient Medications (Cardiovascular):    bisoprolol (ZEBETA) 5 MG tablet, Take 1 tablet Daily for BP & Migraine  Prophylaxis   triamterene-hydrochlorothiazide (MAXZIDE) 75-50 MG tablet, Take 1 tablet Daily for BP & Fluid Retention  Current Outpatient Medications (Respiratory):    ADVAIR DISKUS 250-50 MCG/DOSE AEPB, inhale 1 puff by mouth twice a day if needed for shortness of breath   fexofenadine (ALLEGRA) 180 MG tablet, Take 180 mg by mouth daily.  Current Outpatient Medications (Analgesics):    almotriptan (AXERT) 12.5 MG tablet, - Use stat for Migraine & may repeat 1 x / day in 2 hours if needed  aspirin EC 81 MG tablet, Take 81 mg by mouth daily.   eletriptan (RELPAX) 40 MG tablet, Take 1 tablet for Migraine & may repeat 1 x in 2 hours (Maximum 2 tabs /24 hours)   ibuprofen (ADVIL) 800 MG tablet, Take 1 tablet (800 mg total) by mouth every 8 (eight) hours as needed for moderate pain (take for 2 weeks with food, can take with tylenol).   Current Outpatient Medications (Other):    bimatoprost (LATISSE) 0.03 % ophthalmic solution, PLACE 1 DROP ON APPLICATOR AND APPLY EVENLY ALONG SKIN OF UPPER EYELIDS AT BASE OF EYELASHES ONCE DAILY AT BEDTIME   Cholecalciferol  (VITAMIN D3) 125 MCG (5000 UT) CAPS, Take by mouth daily.   conjugated estrogens (PREMARIN) vaginal cream, Place 1 Applicatorful vaginally daily.   cycloSPORINE (RESTASIS) 0.05 % ophthalmic emulsion, Place 1 drop into both eyes 2 (two) times daily.     dextroamphetamine (DEXEDRINE SPANSULE) 10 MG 24 hr capsule, Take 10 mg by mouth daily.    LINZESS 72 MCG capsule, TAKE 1 CAPSULE BY MOUTH DAILY FOR IRRITABLE BOWEL SYNDROME   magnesium gluconate (MAGONATE) 500 MG tablet, Take 500 mg by mouth daily.   Milnacipran HCl (SAVELLA) 100 MG TABS tablet, Take 2 tablets  /Daily for Mood   ondansetron (ZOFRAN) 8 MG tablet, Take1 tablet 2 to 3 x /day as needed for Nausea / Emesis   Sennosides (SENOKOT PO), Take 1 tablet by mouth as needed.    traZODone (DESYREL) 50 MG tablet, Take 1 tablet (50 mg total) by mouth at bedtime as needed for sleep.  Allergies  Allergen Reactions   Hydrocodone-Acetaminophen Shortness Of Breath   Codeine Hives   Morphine And Related Hives   Nitrofurantoin Other (See Comments)    Makes hands and feet burn    ROS: all negative except above.   Physical Exam: Filed Weights   01/29/20 1125  Weight: 108 lb 3.2 oz (49.1 kg)   BP 122/84    Pulse 83    Temp (!) 97.3 F (36.3 C)    Wt 108 lb 3.2 oz (49.1 kg)    SpO2 98%    BMI 20.11 kg/m  General Appearance: Well nourished, in no apparent distress. Eyes: PERRLA, EOMs, conjunctiva no swelling or erythema Sinuses: No Frontal/maxillary tenderness ENT/Mouth: Ext aud canals clear, TMs without erythema, bulging. No erythema, swelling, or exudate on post pharynx.  Tonsils not swollen or erythematous. Hearing normal.  Neck: Supple, thyroid normal.  Respiratory: Respiratory effort normal, BS equal bilaterally without rales, rhonchi, wheezing or stridor.  Cardio: RRR with no MRGs. Brisk peripheral pulses without edema.  Abdomen: Soft, + BS.  Non tender, no guarding, rebound, hernias, masses. Lymphatics: Non tender without  lymphadenopathy.  Musculoskeletal: Full ROM, 5/5 strength, normal gait.  Skin: Warm, dry without rashes, lesions, ecchymosis.  Neuro: Cranial nerves intact. Normal muscle tone, no cerebellar symptoms. Sensation intact.  Psych: Awake and oriented X 3, normal affect, Insight and Judgment appropriate.     Quentin Mulling, PA-C 12:44 PM [facility]'

## 2020-01-29 ENCOUNTER — Encounter: Payer: Self-pay | Admitting: Physician Assistant

## 2020-01-29 ENCOUNTER — Other Ambulatory Visit: Payer: Self-pay

## 2020-01-29 ENCOUNTER — Ambulatory Visit (INDEPENDENT_AMBULATORY_CARE_PROVIDER_SITE_OTHER): Payer: Medicare Other | Admitting: Physician Assistant

## 2020-01-29 VITALS — BP 122/84 | HR 83 | Temp 97.3°F | Wt 108.2 lb

## 2020-01-29 DIAGNOSIS — E538 Deficiency of other specified B group vitamins: Secondary | ICD-10-CM

## 2020-01-29 DIAGNOSIS — F5101 Primary insomnia: Secondary | ICD-10-CM | POA: Diagnosis not present

## 2020-01-29 DIAGNOSIS — E348 Other specified endocrine disorders: Secondary | ICD-10-CM

## 2020-01-29 DIAGNOSIS — D649 Anemia, unspecified: Secondary | ICD-10-CM | POA: Diagnosis not present

## 2020-01-29 LAB — IRON, TOTAL/TOTAL IRON BINDING CAP
%SAT: 22 % (calc) (ref 16–45)
Iron: 79 ug/dL (ref 45–160)
TIBC: 362 mcg/dL (calc) (ref 250–450)

## 2020-01-29 LAB — FERRITIN: Ferritin: 28 ng/mL (ref 16–288)

## 2020-01-29 LAB — VITAMIN B12: Vitamin B-12: 484 pg/mL (ref 200–1100)

## 2020-01-29 MED ORDER — TRAZODONE HCL 50 MG PO TABS: 50.0000 mg | ORAL_TABLET | Freq: Every evening | ORAL | 1 refills | Status: DC | PRN

## 2020-01-29 MED ORDER — ESTRADIOL 0.05 MG/24HR TD PTTW: MEDICATED_PATCH | TRANSDERMAL | 3 refills | Status: DC

## 2020-01-29 NOTE — Patient Instructions (Signed)
HYPERTENSION INFORMATION  Monitor your blood pressure at home, please keep a record and bring that in with you to your next office visit.   Go to the ER if any CP, SOB, nausea, dizziness, severe HA, changes vision/speech  Testing/Procedures: HOW TO TAKE YOUR BLOOD PRESSURE:  Rest 5 minutes before taking your blood pressure.  Dont smoke or drink caffeinated beverages for at least 30 minutes before.  Take your blood pressure before (not after) you eat.  Sit comfortably with your back supported and both feet on the floor (dont cross your legs).  Elevate your arm to heart level on a table or a desk.  Use the proper sized cuff. It should fit smoothly and snugly around your bare upper arm. There should be enough room to slip a fingertip under the cuff. The bottom edge of the cuff should be 1 inch above the crease of the elbow.  Due to a recent study, SPRINT, we have changed our goal for the systolic or top blood pressure number. Ideally we want your top number at 120.  In the [facility] Trial, 5000 people were randomized to a goal BP of 120 and 5000 people were randomized to a goal BP of less than 140. The patients with the goal BP at 120 had LESS DEMENTIA, LESS HEART ATTACKS, AND LESS STROKES, AS WELL AS OVERALL DECREASED MORTALITY OR DEATH RATE.   There was another study that showed taking your blood pressure medications at night decrease cardiovascular events.  However if you are on a fluid pill, please take this in the morning.   If you are willing, our goal BP is the top number of 120.  Your most recent BP: BP: 122/84   Take your medications faithfully as instructed. Maintain a healthy weight. Get at least 150 minutes of aerobic exercise per week. Minimize salt intake. Minimize alcohol intake  DASH Eating Plan DASH stands for "Dietary Approaches to Stop Hypertension." The DASH eating plan is a healthy eating plan that has been shown to reduce high blood pressure (hypertension).  Additional health benefits may include reducing the risk of type 2 diabetes mellitus, heart disease, and stroke. The DASH eating plan may also help with weight loss. WHAT DO I NEED TO KNOW ABOUT THE DASH EATING PLAN? For the DASH eating plan, you will follow these general guidelines:  Choose foods with a percent daily value for sodium of less than 5% (as listed on the food label).  Use salt-free seasonings or herbs instead of table salt or sea salt.  Check with your health care provider or pharmacist before using salt substitutes.  Eat lower-sodium products, often labeled as "lower sodium" or "no salt added."  Eat fresh foods.  Eat more vegetables, fruits, and low-fat dairy products.  Choose whole grains. Look for the word "whole" as the first word in the ingredient list.  Choose fish and skinless chicken or [place] more often than red meat. Limit fish, poultry, and meat to 6 oz (170 g) each day.  Limit sweets, desserts, sugars, and sugary drinks.  Choose heart-healthy fats.  Limit cheese to 1 oz (28 g) per day.  Eat more home-cooked food and less restaurant, buffet, and fast food.  Limit fried foods.  Cook foods using methods other than frying.  Limit canned vegetables. If you do use them, rinse them well to decrease the sodium.  When eating at a restaurant, ask that your food be prepared with less salt, or no salt if possible. WHAT FOODS CAN  I EAT? Seek help from a dietitian for individual calorie needs. Grains Whole grain or whole wheat bread. Brown rice. Whole grain or whole wheat pasta. Quinoa, bulgur, and whole grain cereals. Low-sodium cereals. Corn or whole wheat flour tortillas. Whole grain cornbread. Whole grain crackers. Low-sodium crackers. Vegetables Fresh or frozen vegetables (raw, steamed, roasted, or grilled). Low-sodium or reduced-sodium tomato and vegetable juices. Low-sodium or reduced-sodium tomato sauce and paste. Low-sodium or reduced-sodium canned  vegetables.  Fruits All fresh, canned (in natural juice), or frozen fruits. Meat and Other Protein Products Ground beef (85% or leaner), grass-fed beef, or beef trimmed of fat. Skinless chicken or [place]. Ground chicken or [place]. Pork trimmed of fat. All fish and seafood. Eggs. Dried beans, peas, or lentils. Unsalted nuts and seeds. Unsalted canned beans. Dairy Low-fat dairy products, such as skim or 1% milk, 2% or reduced-fat cheeses, low-fat ricotta or cottage cheese, or plain low-fat yogurt. Low-sodium or reduced-sodium cheeses. Fats and Oils Tub margarines without trans fats. Light or reduced-fat mayonnaise and salad dressings (reduced sodium). Avocado. Safflower, olive, or canola oils. Natural peanut or almond butter. Other Unsalted popcorn and pretzels. The items listed above may not be a complete list of recommended foods or beverages. Contact your dietitian for more options. WHAT FOODS ARE NOT RECOMMENDED? Grains White bread. White pasta. White rice. Refined cornbread. Bagels and croissants. Crackers that contain trans fat. Vegetables Creamed or fried vegetables. Vegetables in a cheese sauce. Regular canned vegetables. Regular canned tomato sauce and paste. Regular tomato and vegetable juices. Fruits Dried fruits. Canned fruit in light or heavy syrup. Fruit juice. Meat and Other Protein Products Fatty cuts of meat. Ribs, chicken wings, bacon, sausage, bologna, salami, chitterlings, fatback, hot dogs, bratwurst, and packaged luncheon meats. Salted nuts and seeds. Canned beans with salt. Dairy Whole or 2% milk, cream, half-and-half, and cream cheese. Whole-fat or sweetened yogurt. Full-fat cheeses or blue cheese. Nondairy creamers and whipped toppings. Processed cheese, cheese spreads, or cheese curds. Condiments Onion and garlic salt, seasoned salt, table salt, and sea salt. Canned and packaged gravies. Worcestershire sauce. Tartar sauce. Barbecue sauce. Teriyaki sauce. Soy sauce,  including reduced sodium. Steak sauce. Fish sauce. Oyster sauce. Cocktail sauce. Horseradish. Ketchup and mustard. Meat flavorings and tenderizers. Bouillon cubes. Hot sauce. Tabasco sauce. Marinades. Taco seasonings. Relishes. Fats and Oils Butter, stick margarine, lard, shortening, ghee, and bacon fat. Coconut, palm kernel, or palm oils. Regular salad dressings. Other Pickles and olives. Salted popcorn and pretzels. The items listed above may not be a complete list of foods and beverages to avoid. Contact your dietitian for more information. WHERE CAN I FIND MORE INFORMATION? National Heart, Lung, and Blood Institute: [employer] Document Released: 05/21/2011 Document Revised: 10/16/2013 Document Reviewed: 04/05/2013 [facility] Patient Information 2015 [place], [place]. This information is not intended to replace advice given to you by your health care provider. Make sure you discuss any questions you have with your health care provider.     1.  Limit use of pain relievers to no more than 2 days out of week to prevent risk of rebound or medication-overuse headache. 2.  Keep headache diary 3.  Exercise, hydration, caffeine cessation, sleep hygiene, monitor for and avoid triggers 4.  Consider:  magnesium citrate 400mg  daily, riboflavin 400mg  daily, and coenzyme Q10 100mg  three times daily   We may also treat TMJ if we think you have it If you are having frequent migraines we may put you on a once a day medication with fast acting medication to  take. Also there is such a thing called rebound headache from over use of acute medications.  Please do not use rescue or acute medications more than 10 days a month or more than 3 days per week, this can cause a withdrawal and a rebound headache.  Here is more information below  Please remember, common headache triggers are: sleep deprivation, dehydration, overheating, stress, hypoglycemia or skipping meals and  blood sugar fluctuations, excessive pain medications or excessive alcohol use or caffeine withdrawal. Some people have food triggers such as aged cheese, orange juice or chocolate, especially dark chocolate, or MSG (monosodium glutamate). Try to avoid these headache triggers as much possible.   It may be helpful to keep a headache diary to figure out what makes your headaches worse or brings them on and what alleviates them. Some people report headache onset after exercise but studies have shown that regular exercise may actually prevent headaches from coming. If you have exercise-induced headaches, please make sure that you drink plenty of fluid before and after exercising and that you do not over do it and do not overheat.   Please go to the ER if there is weakness, thunderclap headache, visual changes, or any concerning factors    Migraine Headache A migraine headache is an intense, throbbing pain on one or both sides of your head. Recurrent migraines keep coming back. A migraine can last for 30 minutes to several hours. CAUSES  The exact cause of a migraine headache is not always known. However, a migraine may be caused when nerves in the brain become irritated and release chemicals that cause inflammation. This causes pain. Certain things may also trigger migraines, such as:   Alcohol.  Smoking.  Stress.  Menstruation.  Aged cheeses.  Foods or drinks that contain nitrates, glutamate, aspartame, or tyramine.  Lack of sleep.  Chocolate.  Caffeine.  Hunger.  Physical exertion.  Fatigue.  Medicines used to treat chest pain (nitroglycerine), birth control pills, estrogen, and some blood pressure medicines. SYMPTOMS   Pain on one or both sides of your head.  Pulsating or throbbing pain.  Severe pain that prevents daily activities.  Pain that is aggravated by any physical activity.  Nausea, vomiting, or both.  Dizziness.  Pain with exposure to bright lights, loud  noises, or activity.  General sensitivity to bright lights, loud noises, or smells. Before you get a migraine, you may get warning signs that a migraine is coming (aura). An aura may include:  Seeing flashing lights.  Seeing bright spots, halos, or zigzag lines.  Having tunnel vision or blurred vision.  Having feelings of numbness or tingling.  Having trouble talking.  Having muscle weakness. DIAGNOSIS  A recurrent migraine headache is often diagnosed based on:  Symptoms.  Physical examination.  A CT scan or MRI of your head. These imaging tests cannot diagnose migraines but can help rule out other causes of headaches.   TREATMENT  Medicines may be given for pain and nausea. Medicines can also be given to help prevent recurrent migraines. HOME CARE INSTRUCTIONS  Only take over-the-counter or prescription medicines for pain or discomfort as directed by your health care provider. The use of long-term narcotics is not recommended.  Lie down in a dark, quiet room when you have a migraine.  Keep a journal to find out what may trigger your migraine headaches. For example, write down:  What you eat and drink.  How much sleep you get.  Any change to your diet or medicines.  Limit alcohol consumption.  Quit smoking if you smoke.  Get 7-9 hours of sleep, or as recommended by your health care provider.  Limit stress.  Keep lights dim if bright lights bother you and make your migraines worse. SEEK MEDICAL CARE IF:   You do not get relief from the medicines given to you.  You have a recurrence of pain.  You have a fever. SEEK IMMEDIATE MEDICAL CARE IF:  Your migraine becomes severe.  You have a stiff neck.  You have loss of vision.  You have muscular weakness or loss of muscle control.  You start losing your balance or have trouble walking.  You feel faint or pass out.  You have severe symptoms that are different from your first symptoms. MAKE SURE YOU:    Understand these instructions.  Will watch your condition.  Will get help right away if you are not doing well or get worse.   This information is not intended to replace advice given to you by your health care provider. Make sure you discuss any questions you have with your health care provider.   Document Released: 02/24/2001 Document Revised: 06/22/2014 Document Reviewed: 02/06/2013 Elsevier Interactive Patient Education 2016 [employer]  Common Migraine Triggers   Foods Aged cheese, alcohol, nuts, chocolate, yogurt, onions, figs, liver, caffeinated foods and beverages, monosodium glutamate (MSG), smoked or pickled fish/meat, nitrate/nitrate preserved foods (hotdogs, pepperoni, salami) tyramine  Medications Antibiotics (tetracycline, griseofulvin), antihypertensives (nifedipine, captopril), hormones (oral contraceptives, estrogens), histamine-2 blockers (cimetidine, raniidine, vasodilators (nitroglycerine, isosorbide dinitrate)  Sensory Stimuli Flickering/bright/fluorescent lights, bright sunlight, odors (perfume, chemicals, cigarette smoke)  Lifestyle Changes Time zones, sleep patterns, eating habits, caffeine withdrawal stress  Other Menstrual cycle, weather/season/air pressure changes, high altitude  Adapted from [place], [place]. Clin. J. Med. 1995; Rapoport and [place]. Conquering Headache, 1998  Hormonal variations also are believed to play a part.  Fluctuations of the female hormone estrogen (such as just before menstruation) affect a chemical called serotonin-when serotonin levels in the brain fall, the dilation (expansion) of blood vessels in the brain that is characteristic of migraine often follows.  Many factors or "triggers" can start a migraine.  In people who get migraines, most experts think certain activities or foods may trigger temporary changes in the blood vessels around the brain.  Swelling of these blood vessels may cause pain in the nearby  nerves.  Allergy Headaches:  Hotdogs Milk  Onions  Thyme Bacon  Chocolate Garlic  Nutmeg Ham  Dark Cola Pork  Cinnamon Salami  Nuts  Egg  Ginger Sausage Red wine Cloves  Cheddar Cheese Caffeine

## 2020-02-20 NOTE — Progress Notes (Signed)
Assessment and Plan:  Morton neuroma, left Wear shoes that are wider, get insert, ice with water bottle  refer to poditrist- patient is very active -     Ambulatory referral to Podiatry  Loss of transverse plantar arch of left foot -     Ambulatory referral to Podiatry  Labile hypertension Do maxide daily  Acute recurrent maxillary sinusitis -     amoxicillin-clavulanate (AUGMENTIN) 875-125 MG tablet; Take 1 tablet by mouth 2 (two) times daily for 10 days. -     fluconazole (DIFLUCAN) 150 MG tablet; Take 1 tablet (150 mg total) by mouth daily.    Future Appointments  Date Time Provider Department Center  03/22/2020  9:30 AM Judd Gaudier, NP [facility] None      HPI 69 y.o.female presents for 6 week follow up for HTN and migraine management.   She fractured her knee last year, started walking again in April and started to have bilateral heel pain, worse on the left. She describes pain at bottom of her foot at base of 2nd and 3rd toes. She has some numbness in those toes. She states flip flops and walking makes it worse. She has tried larger shoes, she has tried a support, done exercises from online, has done ice/aleve.   Ziac was added for blood pressure management and as migraine prevention- she did not start it. She did not start it due to her raynauds and has been checking her BP at home has been 100-110's. She has been checking her BP at home, having more of a diastolic elevation. She states last night she ate out last night, felt some swelling in her fingers. She has been taking ibuprofen for recent sinus infection, she took prednisone that helped.   She has history of migraines, normally can help with relpax and can stop it. She will have migraines 1-2 a month, worse with weather and eating out.  She is on estrogen patch, only once a week low dose.  BP Readings from Last 20 Encounters:  02/21/20 (!) 126/92  01/29/20 122/84  10/10/19 (!) 148/96  05/26/19 130/80   05/04/19 130/72  03/23/19 118/70  08/30/18 134/84  08/03/18 110/76  03/01/18 118/76  12/08/17 104/60  10/26/17 124/68  07/08/17 128/82  07/02/17 112/82  03/23/17 110/68  02/23/17 124/68  12/04/16 126/72  11/10/16 128/72  08/24/16 136/62  04/30/16 108/60  04/29/16 132/79   She has seen a counselor for 16 years, she has seen Dr. Evelene Croon in the past, and she was on trazodone at night and  She  has a past surgical history that includes Abdominal hysterectomy; Cesarean section (1979, 1983); Breast enhancement surgery; Laparoscopic abdominal exploration; Hemorrhoid banding; and Appendectomy (1997).   Patient Active Problem List   Diagnosis Date Noted   Labile hypertension 07/07/2017   Prediabetes 06/26/2014   Medication management 06/26/2014   IBS (irritable bowel syndrome)-constipation type 06/26/2014   Vitamin D deficiency 06/26/2014   Asthma, chronic    Chronic sinusitis    Anxiety    Major depression in partial remission (HCC)    Osteopenia    Palpitations 04/01/2010     Current Outpatient Medications (Endocrine & Metabolic):    estradiol (VIVELLE-DOT) 0.05 MG/24HR patch, Apply 1 patch 2 x /week   predniSONE (DELTASONE) 10 MG tablet, Take 1/2 to 1 tablet Daily as Directed  Current Outpatient Medications (Cardiovascular):    triamterene-hydrochlorothiazide (MAXZIDE) 75-50 MG tablet, Take 1 tablet Daily for BP & Fluid Retention  Current Outpatient Medications (Respiratory):  ADVAIR DISKUS 250-50 MCG/DOSE AEPB, inhale 1 puff by mouth twice a day if needed for shortness of breath   fexofenadine (ALLEGRA) 180 MG tablet, Take 180 mg by mouth daily.  Current Outpatient Medications (Analgesics):    almotriptan (AXERT) 12.5 MG tablet, - Use stat for Migraine & may repeat 1 x / day in 2 hours if needed   aspirin EC 81 MG tablet, Take 81 mg by mouth daily.   eletriptan (RELPAX) 40 MG tablet, Take 1 tablet for Migraine & may repeat 1 x in 2 hours (Maximum 2  tabs /24 hours)   ibuprofen (ADVIL) 800 MG tablet, Take 1 tablet (800 mg total) by mouth every 8 (eight) hours as needed for moderate pain (take for 2 weeks with food, can take with tylenol).   Current Outpatient Medications (Other):    bimatoprost (LATISSE) 0.03 % ophthalmic solution, PLACE 1 DROP ON APPLICATOR AND APPLY EVENLY ALONG SKIN OF UPPER EYELIDS AT BASE OF EYELASHES ONCE DAILY AT BEDTIME   Cholecalciferol (VITAMIN D3) 125 MCG (5000 UT) CAPS, Take by mouth daily.   conjugated estrogens (PREMARIN) vaginal cream, Place 1 Applicatorful vaginally daily.   cycloSPORINE (RESTASIS) 0.05 % ophthalmic emulsion, Place 1 drop into both eyes 2 (two) times daily.     LINZESS 72 MCG capsule, TAKE 1 CAPSULE BY MOUTH DAILY FOR IRRITABLE BOWEL SYNDROME   magnesium gluconate (MAGONATE) 500 MG tablet, Take 500 mg by mouth daily.   Milnacipran HCl (SAVELLA) 100 MG TABS tablet, Take 2 tablets  /Daily for Mood   ondansetron (ZOFRAN) 8 MG tablet, Take1 tablet 2 to 3 x /day as needed for Nausea / Emesis   Sennosides (SENOKOT PO), Take 1 tablet by mouth as needed.    traZODone (DESYREL) 50 MG tablet, Take 1 tablet (50 mg total) by mouth at bedtime as needed for sleep.  Allergies  Allergen Reactions   Hydrocodone-Acetaminophen Shortness Of Breath   Codeine Hives   Morphine And Related Hives   Nitrofurantoin Other (See Comments)    Makes hands and feet burn    ROS: all negative except above.   Physical Exam: Filed Weights   02/21/20 1600  Weight: 107 lb (48.5 kg)   BP (!) 126/92    Pulse 67    Temp 97.6 F (36.4 C)    Wt 107 lb (48.5 kg)    SpO2 95%    BMI 19.89 kg/m  General Appearance: Well nourished, in no apparent distress. Eyes: PERRLA, EOMs, conjunctiva no swelling or erythema Sinuses: No Frontal/maxillary tenderness ENT/Mouth: Ext aud canals clear, TMs without erythema, bulging. No erythema, swelling, or exudate on post pharynx.  Tonsils not swollen or erythematous. Hearing  normal.  Neck: Supple, thyroid normal.  Respiratory: Respiratory effort normal, BS equal bilaterally without rales, rhonchi, wheezing or stridor.  Cardio: RRR with no MRGs. Brisk peripheral pulses without edema.  Abdomen: Soft, + BS.  Non tender, no guarding, rebound, hernias, masses. Lymphatics: Non tender without lymphadenopathy.  Musculoskeletal: Full ROM, 5/5 strength, antalgic gait, negative squeeze test, poor arches.  Skin: Warm, dry without rashes, lesions, ecchymosis.  Neuro: Cranial nerves intact. Normal muscle tone, no cerebellar symptoms. Sensation intact.  Psych: Awake and oriented X 3, normal affect, Insight and Judgment appropriate.     Quentin Mulling, PA-C 4:11 PM [facility]'

## 2020-02-21 ENCOUNTER — Encounter: Payer: Self-pay | Admitting: Physician Assistant

## 2020-02-21 ENCOUNTER — Other Ambulatory Visit: Payer: Self-pay

## 2020-02-21 ENCOUNTER — Ambulatory Visit (INDEPENDENT_AMBULATORY_CARE_PROVIDER_SITE_OTHER): Payer: Medicare Other | Admitting: Physician Assistant

## 2020-02-21 VITALS — BP 126/92 | HR 67 | Temp 97.6°F | Wt 107.0 lb

## 2020-02-21 DIAGNOSIS — M216X2 Other acquired deformities of left foot: Secondary | ICD-10-CM

## 2020-02-21 DIAGNOSIS — J0101 Acute recurrent maxillary sinusitis: Secondary | ICD-10-CM | POA: Diagnosis not present

## 2020-02-21 DIAGNOSIS — R0989 Other specified symptoms and signs involving the circulatory and respiratory systems: Secondary | ICD-10-CM | POA: Diagnosis not present

## 2020-02-21 DIAGNOSIS — G5762 Lesion of plantar nerve, left lower limb: Secondary | ICD-10-CM | POA: Diagnosis not present

## 2020-02-21 MED ORDER — FLUCONAZOLE 150 MG PO TABS: 150.0000 mg | ORAL_TABLET | Freq: Every day | ORAL | 3 refills | Status: DC

## 2020-02-21 MED ORDER — AMOXICILLIN-POT CLAVULANATE 875-125 MG PO TABS: 1.0000 | ORAL_TABLET | Freq: Two times a day (BID) | ORAL | 0 refills | Status: AC

## 2020-02-21 NOTE — Patient Instructions (Addendum)
Get on the fluid pill daily and monitor your blood pressure   Morton Neuralgia  Morton neuralgia is foot pain that affects the ball of the foot and the area near the toes. Morton neuralgia occurs when part of a nerve in the foot (digital nerve) is under too much pressure (compressed). When this happens over a long period of time, the nerve can thicken (neuroma) and cause pain. Pain usually occurs between the third and fourth toes.  Morton neuralgia can come and go but may get worse over time. What are the causes? This condition is caused by doing the same things over and over with your foot, such as:  Activities such as running or jumping.  Wearing shoes that are too tight. What increases the risk? You may be at higher risk for Morton neuralgia if you:  Are female.  Wear high heels.  Wear shoes that are narrow or tight.  Do activities that repeatedly stretch your toes, such as: ? Running. ? Ballet. ? Long-distance walking. What are the signs or symptoms? The first symptom of Morton neuralgia is pain that spreads from the ball of the foot to the toes. It may feel like you are walking on a marble. Pain usually gets worse with walking and goes away at night. Other symptoms may include numbness and cramping of your toes. Both feet are equally affected, but rarely at the same time. How is this diagnosed? This condition is diagnosed based on your symptoms, your medical history, and a physical exam. Your health care provider may:  Squeeze your foot just behind your toe.  Ask you to move your toes to check for pain.  Ask about your physical activity level. You also may have imaging tests, such as an X-ray, ultrasound, or MRI. How is this treated? Treatment depends on how severe your condition is and what causes it. Treatment may involve:  Wearing different shoes that are not too tight, are low-heeled, and provide good support. For some people, this is the only treatment  needed.  Wearing an over-the-counter or custom supportive pad (orthotic) under the front of your foot.  Getting injections of numbing medicine and anti-inflammatory medicine (steroid) in the nerve.  Having surgery to remove part of the thickened nerve. Follow these instructions at home: Managing pain, stiffness, and swelling   Massage your foot as needed.  Wear orthotics as told by your health care provider.  If directed, put ice on your foot: ? Put ice in a plastic bag. ? Place a towel between your skin and the bag. ? Leave the ice on for 20 minutes, 2-3 times a day.  Avoid activities that cause pain or make pain worse. If you play sports, ask your health care provider when it is safe for you to return to sports.  Raise (elevate) your foot above the level of your heart while lying down and, when possible, while sitting. General instructions  Take over-the-counter and prescription medicines only as told by your health care provider.  Do not drive or use heavy machinery while taking prescription pain medicine.  Wear shoes that: ? Have soft soles. ? Have a wide toe area. ? Provide arch support. ? Do not pinch or squeeze your feet. ? Have room for your orthotics, if applicable.  Keep all follow-up visits as told by your health care provider. This is important. Contact a health care provider if:  Your symptoms get worse or do not get better with treatment and home care. Summary  Alexis Weaver  neuralgia is foot pain that affects the ball of the foot and the area near the toes. Pain usually occurs between the third and fourth toes, gets worse with walking, and goes away at night.  Morton neuralgia occurs when part of a nerve in the foot (digital nerve) is under too much pressure. When this happens over a long period of time, the nerve can thicken (neuroma) and cause pain.  This condition is caused by doing the same things over and over with your foot, such as running or jumping,  wearing shoes that are too tight, or wearing high heels.  Treatment may involve wearing low-heeled shoes that are not too tight, wearing a supportive pad (orthotic) under the front of your foot, getting injections in the nerve, or having surgery to remove part of the thickened nerve. This information is not intended to replace advice given to you by your health care provider. Make sure you discuss any questions you have with your health care provider. Document Revised: 06/15/2017 Document Reviewed: 06/15/2017 Elsevier Patient Education  2020 [employer].

## 2020-03-05 ENCOUNTER — Other Ambulatory Visit: Payer: Self-pay

## 2020-03-05 DIAGNOSIS — E348 Other specified endocrine disorders: Secondary | ICD-10-CM

## 2020-03-05 MED ORDER — ESTRADIOL 0.05 MG/24HR TD PTTW: MEDICATED_PATCH | TRANSDERMAL | 3 refills | Status: DC

## 2020-03-07 ENCOUNTER — Ambulatory Visit (INDEPENDENT_AMBULATORY_CARE_PROVIDER_SITE_OTHER): Payer: Medicare Other

## 2020-03-07 ENCOUNTER — Encounter: Payer: Medicare Other | Admitting: Physician Assistant

## 2020-03-07 ENCOUNTER — Other Ambulatory Visit: Payer: Self-pay

## 2020-03-07 ENCOUNTER — Other Ambulatory Visit: Payer: Self-pay | Admitting: Podiatry

## 2020-03-07 ENCOUNTER — Ambulatory Visit (INDEPENDENT_AMBULATORY_CARE_PROVIDER_SITE_OTHER): Payer: Medicare Other | Admitting: Podiatry

## 2020-03-07 ENCOUNTER — Encounter: Payer: Self-pay | Admitting: Podiatry

## 2020-03-07 DIAGNOSIS — G5762 Lesion of plantar nerve, left lower limb: Secondary | ICD-10-CM | POA: Diagnosis not present

## 2020-03-07 DIAGNOSIS — M79672 Pain in left foot: Secondary | ICD-10-CM

## 2020-03-07 DIAGNOSIS — M722 Plantar fascial fibromatosis: Secondary | ICD-10-CM

## 2020-03-07 DIAGNOSIS — M778 Other enthesopathies, not elsewhere classified: Secondary | ICD-10-CM | POA: Diagnosis not present

## 2020-03-11 NOTE — Progress Notes (Signed)
Subjective:   Patient ID: Alexis Weaver, female   DOB: 69 y.o.   MRN: [id number]   HPI 69 year old female presents the office today for concerns of possible neuroma which upon eval yearly has been getting worse.  She states that she is unable to walk as much.  She has bought new inserts and shoes but she still feels that every step.  She is also concerned to get some pain to both of her heels.  No recent injury or falls.  No other concerns today.  Review of Systems  All other systems reviewed and are negative.  Past Medical History:  Diagnosis Date  . Allergy   . Anemia 2016  . Anxiety   . Asthma   . Chronic headaches   . Chronic sinusitis   . Depression   . Fibromyalgia   . GERD (gastroesophageal reflux disease)   . IBS (irritable bowel syndrome)   . Meckel diverticulum 1997  . Meckel's diverticulum   . Neuromuscular disorder (HCC)    fibromyagia  . Osteopenia     Past Surgical History:  Procedure Laterality Date  . ABDOMINAL HYSTERECTOMY     tubes/ovaries spared  . APPENDECTOMY  1997  . BREAST ENHANCEMENT SURGERY    . CESAREAN SECTION  1979, 1983   x 2  . HEMORRHOID BANDING    . LAPAROSCOPIC ABDOMINAL EXPLORATION     peritonitis, appendectomy     Current Outpatient Medications:  .  ALPRAZolam (XANAX) 0.25 MG tablet, , Disp: , Rfl:  .  dextroamphetamine (DEXEDRINE SPANSULE) 10 MG 24 hr capsule, take 2 capsules by mouth every morning and 2 by mouth at noon, Disp: , Rfl:  .  fluticasone (FLONASE) 50 MCG/ACT nasal spray, , Disp: , Rfl:  .  lubiprostone (AMITIZA) 24 MCG capsule, , Disp: , Rfl:  .  montelukast (SINGULAIR) 10 MG tablet, , Disp: , Rfl:  .  ADVAIR DISKUS 250-50 MCG/DOSE AEPB, inhale 1 puff by mouth twice a day if needed for shortness of breath, Disp: 60 each, Rfl: 11 .  almotriptan (AXERT) 12.5 MG tablet, - Use stat for Migraine & may repeat 1 x / day in 2 hours if needed, Disp: 12 tablet, Rfl: 5 .  aspirin EC 81 MG tablet, Take 81 mg by mouth daily.,  Disp: , Rfl:  .  bimatoprost (LATISSE) 0.03 % ophthalmic solution, PLACE 1 DROP ON APPLICATOR AND APPLY EVENLY ALONG SKIN OF UPPER EYELIDS AT BASE OF EYELASHES ONCE DAILY AT BEDTIME, Disp: 5 mL, Rfl: 11 .  Cholecalciferol (VITAMIN D3) 125 MCG (5000 UT) CAPS, Take by mouth daily., Disp: , Rfl:  .  conjugated estrogens (PREMARIN) vaginal cream, Place 1 Applicatorful vaginally daily., Disp: 42.5 g, Rfl: 12 .  cycloSPORINE (RESTASIS) 0.05 % ophthalmic emulsion, Place 1 drop into both eyes 2 (two) times daily.  , Disp: , Rfl:  .  eletriptan (RELPAX) 40 MG tablet, Take 1 tablet for Migraine & may repeat 1 x in 2 hours (Maximum 2 tabs /24 hours), Disp: 6 tablet, Rfl: 5 .  esomeprazole (NEXIUM) 40 MG capsule, Take by mouth., Disp: , Rfl:  .  estradiol (VIVELLE-DOT) 0.05 MG/24HR patch, Apply 1 patch 2 x /week, Disp: 24 patch, Rfl: 3 .  fexofenadine (ALLEGRA) 180 MG tablet, Take 180 mg by mouth daily., Disp: , Rfl:  .  fluconazole (DIFLUCAN) 150 MG tablet, Take 1 tablet (150 mg total) by mouth daily., Disp: 1 tablet, Rfl: 3 .  ibuprofen (ADVIL) 800 MG tablet, Take 1  tablet (800 mg total) by mouth every 8 (eight) hours as needed for moderate pain (take for 2 weeks with food, can take with tylenol)., Disp: 90 tablet, Rfl: 0 .  LINZESS 72 MCG capsule, TAKE 1 CAPSULE BY MOUTH DAILY FOR IRRITABLE BOWEL SYNDROME, Disp: 90 capsule, Rfl: 3 .  magnesium gluconate (MAGONATE) 500 MG tablet, Take 500 mg by mouth daily., Disp: , Rfl:  .  Milnacipran HCl (SAVELLA) 100 MG TABS tablet, Take 2 tablets  /Daily for Mood, Disp: 180 tablet, Rfl: 1 .  NIFEdipine (PROCARDIA-XL/NIFEDICAL-XL) 30 MG 24 hr tablet, nifedipine ER 30 mg tablet,extended release 24 hr, Disp: , Rfl:  .  ondansetron (ZOFRAN) 8 MG tablet, Take1 tablet 2 to 3 x /day as needed for Nausea / Emesis, Disp: 100 tablet, Rfl: 0 .  predniSONE (DELTASONE) 10 MG tablet, Take 1/2 to 1 tablet Daily as Directed, Disp: 90 tablet, Rfl: 0 .  Sennosides (SENOKOT PO), Take 1  tablet by mouth as needed. , Disp: , Rfl:  .  traZODone (DESYREL) 50 MG tablet, Take 1 tablet (50 mg total) by mouth at bedtime as needed for sleep., Disp: 90 tablet, Rfl: 1 .  triamterene-hydrochlorothiazide (MAXZIDE) 75-50 MG tablet, Take 1 tablet Daily for BP & Fluid Retention, Disp: 90 tablet, Rfl: 3  Allergies  Allergen Reactions  . Hydrocodone-Acetaminophen Shortness Of Breath  . Codeine Hives  . Morphine And Related Hives  . Nitrofurantoin Other (See Comments)    Makes hands and feet burn  . Tetracycline          Objective:  Physical Exam  General: AAO x3, NAD  Dermatological: Skin is warm, dry and supple bilateral.  There are no open sores, no preulcerative lesions, no rash or signs of infection present.  Vascular: Dorsalis Pedis artery and Posterior Tibial artery pedal pulses are 2/4 bilateral with immedate capillary fill time. There is no pain with calf compression, swelling, warmth, erythema.   Neruologic: Grossly intact via light touch bilateral.  Musculoskeletal: There is mild tenderness palpation of plantar medial tubercle of the calcaneus with insertion of plantar fascia.  Also she does get discomfort submetatarsal area as well as second interspace.  No area pinpoint tenderness.  No edema, erythema.  Muscular strength 5/5 in all groups tested bilateral.  Gait: Unassisted, Nonantalgic.       Assessment:   69 year old female left foot pain, metatarsalgia, concern for neuroma; plantar fasciitis     Plan:  -Treatment options discussed including all alternatives, risks, and complications -Etiology of symptoms were discussed -X-rays were obtained and reviewed with the patient.  No evidence of acute fracture or stress fracture identified today. -Discussed steroid injection. -Discussed shoe modifications and orthotics.  She was to proceed with custom inserts.  She was measured for orthotics today with Raiford Noble.  Vivi Barrack DPM

## 2020-03-20 ENCOUNTER — Telehealth: Payer: Self-pay | Admitting: Podiatry

## 2020-03-20 NOTE — Telephone Encounter (Signed)
Pt left message on 10.1 asking if orthotics are back. She is going out of town mid October and wanted to get them this week if they were back in.  I returned call and they are not back and I have scheduled pt to pick up orthotics on 11.2 when she has an appt already scheduled with Dr Ardelle Anton.

## 2020-03-22 ENCOUNTER — Other Ambulatory Visit: Payer: Self-pay

## 2020-03-22 ENCOUNTER — Ambulatory Visit (INDEPENDENT_AMBULATORY_CARE_PROVIDER_SITE_OTHER): Payer: Medicare Other | Admitting: Adult Health

## 2020-03-22 ENCOUNTER — Encounter: Payer: Self-pay | Admitting: Adult Health

## 2020-03-22 VITALS — BP 138/82 | HR 78 | Temp 97.5°F | Ht 60.25 in | Wt 106.6 lb

## 2020-03-22 DIAGNOSIS — K581 Irritable bowel syndrome with constipation: Secondary | ICD-10-CM

## 2020-03-22 DIAGNOSIS — E559 Vitamin D deficiency, unspecified: Secondary | ICD-10-CM

## 2020-03-22 DIAGNOSIS — R0989 Other specified symptoms and signs involving the circulatory and respiratory systems: Secondary | ICD-10-CM | POA: Diagnosis not present

## 2020-03-22 DIAGNOSIS — Z136 Encounter for screening for cardiovascular disorders: Secondary | ICD-10-CM

## 2020-03-22 DIAGNOSIS — Z23 Encounter for immunization: Secondary | ICD-10-CM | POA: Diagnosis not present

## 2020-03-22 DIAGNOSIS — R002 Palpitations: Secondary | ICD-10-CM

## 2020-03-22 DIAGNOSIS — Z1329 Encounter for screening for other suspected endocrine disorder: Secondary | ICD-10-CM

## 2020-03-22 DIAGNOSIS — Z1389 Encounter for screening for other disorder: Secondary | ICD-10-CM

## 2020-03-22 DIAGNOSIS — Z0000 Encounter for general adult medical examination without abnormal findings: Secondary | ICD-10-CM | POA: Diagnosis not present

## 2020-03-22 DIAGNOSIS — R7309 Other abnormal glucose: Secondary | ICD-10-CM

## 2020-03-22 DIAGNOSIS — J45909 Unspecified asthma, uncomplicated: Secondary | ICD-10-CM

## 2020-03-22 DIAGNOSIS — M8580 Other specified disorders of bone density and structure, unspecified site: Secondary | ICD-10-CM

## 2020-03-22 DIAGNOSIS — Z131 Encounter for screening for diabetes mellitus: Secondary | ICD-10-CM

## 2020-03-22 DIAGNOSIS — Z6820 Body mass index (BMI) 20.0-20.9, adult: Secondary | ICD-10-CM

## 2020-03-22 DIAGNOSIS — Z79899 Other long term (current) drug therapy: Secondary | ICD-10-CM

## 2020-03-22 DIAGNOSIS — F3341 Major depressive disorder, recurrent, in partial remission: Secondary | ICD-10-CM

## 2020-03-22 DIAGNOSIS — I7300 Raynaud's syndrome without gangrene: Secondary | ICD-10-CM

## 2020-03-22 DIAGNOSIS — J320 Chronic maxillary sinusitis: Secondary | ICD-10-CM

## 2020-03-22 DIAGNOSIS — G43909 Migraine, unspecified, not intractable, without status migrainosus: Secondary | ICD-10-CM

## 2020-03-22 DIAGNOSIS — F419 Anxiety disorder, unspecified: Secondary | ICD-10-CM

## 2020-03-22 DIAGNOSIS — Z13220 Encounter for screening for lipoid disorders: Secondary | ICD-10-CM

## 2020-03-22 HISTORY — DX: Migraine, unspecified, not intractable, without status migrainosus: G43.909

## 2020-03-22 MED ORDER — LINACLOTIDE 145 MCG PO CAPS: ORAL_CAPSULE | ORAL | 3 refills | Status: DC

## 2020-03-22 NOTE — Patient Instructions (Addendum)
  Ms. Wierman , Thank you for taking time to come for your Medicare Wellness Visit. I appreciate your ongoing commitment to your health goals. Please review the following plan we discussed and let me know if I can assist you in the future.   These are the goals we discussed: Goals    . Blood Pressure < 130/80       This is a list of the screening recommended for you and due dates:  Health Maintenance  Topic Date Due  . Flu Shot  01/14/2020  . Mammogram  06/21/2020  . Tetanus Vaccine  02/25/2021  . Colon Cancer Screening  04/29/2026  . DEXA scan (bone density measurement)  Completed  . COVID-19 Vaccine  Completed  .  Hepatitis C: One time screening is recommended by Center for Disease Control  (CDC) for  adults born from [birth year] through 1965.   Completed  . Pneumonia vaccines  Completed      Know what a healthy weight is for you (roughly BMI <25) and aim to maintain this  Aim for 7+ servings of fruits and vegetables daily  65-80+ fluid ounces of water or unsweet tea for healthy kidneys  Limit to max 1 drink of alcohol per day; avoid smoking/tobacco  Limit animal fats in diet for cholesterol and heart health - choose grass fed whenever available  Avoid highly processed foods, and foods high in saturated/trans fats  Aim for low stress - take time to unwind and care for your mental health  Aim for 150 min of moderate intensity exercise weekly for heart health, and weights twice weekly for bone health  Aim for 7-9 hours of sleep daily    A great goal to work towards is aiming to get in a serving daily of some of the most nutritionally dense foods - G- BOMBS daily

## 2020-03-22 NOTE — Progress Notes (Signed)
CPE  Assessment:   Encounter for routine physical exam   Labile hypertension Has low BPs at home intermittently, didn't tolerate meds; will defer for now unless persistently above goal  Monitor blood pressure at home; call if consistently over 130/80 Continue DASH diet, lifestyle discussed  Reminder to go to the ER if any CP, SOB, nausea, dizziness, severe HA, changes vision/speech, left arm numbness and tingling and jaw pain.  Other abnormal glucose/hx of prediabetes Discussed general issues about diabetes pathophysiology and management., Educational material distributed., Suggested low cholesterol diet., Encouraged aerobic exercise., Discussed foot care., Reminded to get yearly retinal exam.  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  Continue medications; declines any changes after discussion  Lifestyle discussed: diet/exerise, sleep hygiene, stress management, hydration No SI/HI   IBS (irritable bowel syndrome)-constipation type Try increase linzess 145 mg daily; PRN senokot and stool softener Discussed lifesytle; increase fluid intake; discussed resuming daily exercise; discussed IBS friendly fiber; try citrucel/benefiber  Asthma, chronic, unspecified asthma severity, uncomplicated Continue medications;   Migraines Recently improved; has seen neuro; rare eletriptan  Raynaud's  Didn't tolerate nifedipine, labile low BPs She reports doing well with lifestyle changes   Orders Placed This Encounter  Procedures  . DG Bone Density  . CBC with Differential/Platelet  . COMPLETE METABOLIC PANEL WITH GFR  . Magnesium  . Lipid panel  . TSH  .  Hemoglobin A1c  . VITAMIN D 25 Hydroxy (Vit-D Deficiency, Fractures)  . Urinalysis, Routine w reflex microscopic  . Microalbumin / creatinine urine ratio  . EKG 12-Lead    Over 30 minutes of exam, counseling, chart review and critical decision making was performed Future Appointments  Date Time Provider Department Center  04/16/2020  3:15 PM Vivi BarrackWagoner, Matthew R, DPM [facility]  04/16/2020  3:45 PM Revonda HumphreyPuckett, Raiford Nobleick [facility]  10/07/2020  9:30 AM Lucky CowboyMcKeown, William, MD [facility] None  03/31/2021  9:00 AM Judd Gaudierorbett, Yocelyn Brocious, NP [facility] None      Subjective:  Leim FabryDeborah S Faniel is a 69 y.o. female who presents for CPE and follow up. She has Palpitations; Asthma, chronic; Chronic sinusitis; Anxiety; Major depression in partial remission (HCC); Osteopenia; Other abnormal glucose (history of prediabetes); Medication management; IBS (irritable bowel syndrome)-constipation type; Vitamin D deficiency; Labile hypertension; Pain in left knee; Migraines; Raynaud's phenomenon without gangrene; and Raynaud disease on their problem list.   She is married, 2 children; retired Engineer, civil (consulting)nurse.  No new concerns  She has tender pressure points, had negative ANA, AntiDNA, RF.  She was diagnosed with FM and is on savella many years back.   She reports long history of migraines; has seen neuro; much improved in recent years since retiring; she does have abortive relpax which works well.   She was seeing a therapist and Dr. Evelene CroonKaur for chronic depression following trauma in childhood, son with addiction, taking trazodone for sleep, feels doing well with this and savella, declines med changes.   Has seen Dr. Ezzard StandingNewman and [place] CallasSharma for allergies/asthma but shots didn't help.  She is on allegra, and prednisone very rarely for allergies and manages fairly.   On linzess for IBS-C and manages fairly, but feels current dose hasn't been working as well in recent months (72 mg).  She had normal colonoscopy by Dr. Russella Dar in  2017.   She has reported episodic red/blue/white discoloration and coolness of extremities and underwent LE vascular US which was unremarkable; felt to be Raynaud's, was prescribed procardia but was unable to tolerate low BP, prefers managing with lifestyle.  BMI is Body mass index is 20.65 kg/m., she has been working on diet and exercise, exercises 60+ min at least 5 days a week.  Wt Readings from Last 3 Encounters:  03/22/20 106 lb 9.6 oz (48.4 kg)  02/21/20 107 lb (48.5 kg)  01/29/20 108 lb 3.2 oz (49.1 kg)    Her blood pressure has been controlled at home (labile, ranges 110/60-rare low 140s/80s), today their BP is BP: 138/82 She does workout. She denies chest pain, shortness of breath, dizziness.    She is not on cholesterol medication and denies myalgias. Her cholesterol is at goal. The cholesterol last visit was:   Lab Results  Component Value Date   CHOL 192 10/10/2019   HDL 98 10/10/2019   LDLCALC 81 10/10/2019   TRIG 53 10/10/2019   CHOLHDL 2.0 10/10/2019   She has hx of prediabetes (A1C 6.0 in 2016, 5.8 in 2020). She has been working on lifestyle for glucose management. Last A1C:  Lab Results  Component Value Date   HGBA1C 5.4 10/10/2019   Lab Results  Component Value Date   GFRNONAA 78 10/10/2019   Patient is on Vitamin D supplement.   Lab Results  Component Value Date   VD25OH 100 10/10/2019         Medication Review: Current Outpatient Medications on File Prior to Visit  Medication Sig Dispense Refill  . ADVAIR DISKUS 250-50 MCG/DOSE AEPB inhale 1 puff by mouth twice a day if needed for shortness of breath 60 each 11  . almotriptan (AXERT) 12.5 MG tablet - Use stat for Migraine & may repeat 1 x / day in 2 hours if needed 12 tablet 5  . aspirin EC 81 MG tablet Take 81 mg by mouth daily.    . bimatoprost (LATISSE) 0.03 % ophthalmic solution PLACE 1 DROP ON APPLICATOR AND APPLY EVENLY ALONG SKIN OF UPPER EYELIDS AT BASE OF EYELASHES ONCE DAILY AT BEDTIME 5 mL  11  . Cholecalciferol (VITAMIN D3) 125 MCG (5000 UT) CAPS Take by mouth daily.    . cycloSPORINE (RESTASIS) 0.05 % ophthalmic emulsion Place 1 drop into both eyes 2 (two) times daily.      Marland Kitchen eletriptan (RELPAX) 40 MG tablet Take 1 tablet for Migraine & may repeat 1 x in 2 hours (Maximum 2 tabs /24 hours) 6 tablet 5  . fexofenadine (ALLEGRA) 180 MG tablet Take 180 mg by mouth daily.    . magnesium gluconate (MAGONATE) 500 MG tablet Take 500 mg by mouth daily.    . Milnacipran HCl (SAVELLA) 100 MG TABS tablet Take 2 tablets  /Daily for Mood 180 tablet 1  . ondansetron (ZOFRAN) 8 MG tablet Take1 tablet 2 to 3 x /day as needed for Nausea / Emesis 100 tablet 0  . predniSONE (DELTASONE) 10 MG tablet Take 1/2 to 1 tablet Daily as Directed 90 tablet 0  . Sennosides (SENOKOT PO) Take 1 tablet by mouth as needed.     . traZODone (DESYREL) 50 MG tablet Take 1 tablet (50 mg total) by mouth at bedtime as needed for sleep. 90 tablet 1  . triamterene-hydrochlorothiazide (MAXZIDE) 75-50 MG tablet Take 1 tablet Daily for BP & Fluid Retention 90 tablet 3  .  ibuprofen (ADVIL) 800 MG tablet Take 1 tablet (800 mg total) by mouth every 8 (eight) hours as needed for moderate pain (take for 2 weeks with food, can take with tylenol). 90 tablet 0   No current facility-administered medications on file prior to visit.    Allergies  Allergen Reactions  . Hydrocodone-Acetaminophen Shortness Of Breath  . Codeine Hives  . Morphine And Related Hives  . Nitrofurantoin Other (See Comments)    Makes hands and feet burn  . Tetracycline     Current Problems (verified) Patient Active Problem List   Diagnosis Date Noted  . Migraines 03/22/2020  . Raynaud's phenomenon without gangrene 03/22/2020  . Raynaud disease 03/22/2020  . Pain in left knee 06/12/2019  . 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 Other abnormal glucose (history of prediabetes) 06/26/2014  . Medication management 06/26/2014  . IBS (irritable bowel  syndrome)-constipation type 06/26/2014  . Vitamin D deficiency 06/26/2014  . Asthma, chronic   . Chronic sinusitis   . Anxiety   . Major depression in partial remission (HCC)   . Osteopenia   . Palpitations 04/01/2010    Screening Tests Immunization History  Administered Date(s) Administered  . DT (Pediatric) 11/06/2004  . Influenza, High Dose Seasonal PF 03/23/2019  . Influenza-Unspecified 02/14/2012  . PFIZER SARS-COV-2 Vaccination 10/12/2019, 11/06/2019  . Pneumococcal Conjugate-13 10/26/2017  . Pneumococcal Polysaccharide-23 03/23/2019  . Tdap 02/26/2011     Tetanus: 2012 Pneumovax: 2020 Prevnar 13: 10/2017 Flu vaccine: 2020, DUE TODAY  Zostavax: N/A Covid 19: 2/2, 2021, pfizer  Pap: 2016, declines another, never abnormal pap  MGM: 06/2019 [facility]  DEXA: 12/2017 R fem T -2.3 - ordered to schedule with next mammogram Colonoscopy: 2017  Dr. Russella Dar EGD: 10/2015  MRI brain 2010 CXR 2015  Names of Other Physician/Practitioners you currently use: 1. [facility] here for primary care 2. Dr. Emily Filbert, eye doctor, last visit has upcoming in Nov 2021 3. Dr. Fara Olden, dentist, last visit 2021, goes q23m  4. Dr. Terri Piedra, derm, last 2021  Patient Care Team: [name], MD as PCP - General (Internal Medicine) Sidney Ace, MD as Referring Physician (Allergy) Marcene Corning, MD as Consulting Physician (Orthopedic Surgery)  SURGICAL HISTORY She  has a past surgical history that includes Abdominal hysterectomy; Cesarean section (1979, 1983); Breast enhancement surgery; Laparoscopic abdominal exploration; Hemorrhoid banding; and Appendectomy (1997). FAMILY HISTORY Her family history includes Asthma in her brother; COPD in her brother; Diabetes in her brother and father; Diverticulitis in her maternal grandmother; Hyperlipidemia in her brother; Hypertension in her father and mother; Rheum arthritis in her sister. SOCIAL HISTORY She  reports  that she has never smoked. She has never used smokeless tobacco. She reports that she does not drink alcohol and does not use drugs.   Review of Systems  Constitutional: Negative for chills, diaphoresis, fever, malaise/fatigue and weight loss.  HENT: Negative for congestion, ear discharge, ear pain, hearing loss, nosebleeds, sore throat and tinnitus.   Eyes: Negative.   Respiratory: Negative for cough, hemoptysis, sputum production, shortness of breath, wheezing and stridor.   Cardiovascular: Negative.   Gastrointestinal: Positive for constipation. Negative for abdominal pain, blood in stool, diarrhea, heartburn, melena, nausea and vomiting.  Genitourinary: Negative for dysuria, flank pain, frequency, hematuria and urgency.  Musculoskeletal: Negative for back pain, falls, joint pain, myalgias and neck pain.  Skin: Negative.   Neurological: Negative.  Negative for weakness and headaches.  Psychiatric/Behavioral: Positive for depression. Negative for hallucinations, memory loss, substance abuse and  suicidal ideas. The patient is not nervous/anxious.      Objective:     Today's Vitals   03/22/20 0934 03/22/20 1038  BP: 140/84 138/82  Pulse: 78   Temp: (!) 97.5 F (36.4 C)   SpO2: 96%   Weight: 106 lb 9.6 oz (48.4 kg)   Height: 5' 0.25" (1.53 m)    Body mass index is 20.65 kg/m.  General appearance: alert, no distress, WD/WN, female HEENT: normocephalic, sclerae anicteric, TMs pearly, nares patent, no discharge or erythema, pharynx normal Oral cavity: MMM, no lesions Neck: supple, no lymphadenopathy, no thyromegaly, no masses Heart: RRR, normal S1, S2, no murmurs Lungs: CTA bilaterally, no wheezes, rhonchi, or rales Breasts: Recent normal mammogram, doing self exams, declines today  Abdomen: +bs, soft, non tender, non distended, no masses, no hepatomegaly, no splenomegaly Musculoskeletal: nontender, no swelling, no obvious deformity. Extremities: no edema, no cyanosis, no  clubbing Pulses: 2+ symmetric, upper and lower extremities, normal cap refill Neurological: alert, oriented x 3, CN2-12 intact, strength normal upper extremities and lower extremities, sensation normal throughout, DTRs 2+ throughout, no cerebellar signs, gait normal Psychiatric: normal affect, behavior normal, pleasant  GU: defer  EKG: sinus arrhythmia; No st changes; WNL  Dan Maker, NP   03/22/2020

## 2020-03-23 ENCOUNTER — Other Ambulatory Visit: Payer: Self-pay | Admitting: Adult Health

## 2020-03-23 DIAGNOSIS — R8290 Unspecified abnormal findings in urine: Secondary | ICD-10-CM

## 2020-03-23 DIAGNOSIS — E876 Hypokalemia: Secondary | ICD-10-CM

## 2020-03-23 LAB — COMPLETE METABOLIC PANEL WITH GFR
AG Ratio: 2 (calc) (ref 1.0–2.5)
ALT: 20 U/L (ref 6–29)
AST: 22 U/L (ref 10–35)
Albumin: 4.6 g/dL (ref 3.6–5.1)
Alkaline phosphatase (APISO): 53 U/L (ref 37–153)
BUN: 21 mg/dL (ref 7–25)
CO2: 37 mmol/L — ABNORMAL HIGH (ref 20–32)
Calcium: 9.8 mg/dL (ref 8.6–10.4)
Chloride: 94 mmol/L — ABNORMAL LOW (ref 98–110)
Creat: 0.68 mg/dL (ref 0.50–0.99)
GFR, Est African American: 104 mL/min/{1.73_m2} (ref >=60)
GFR, Est Non African American: 90 mL/min/{1.73_m2} (ref >=60)
Globulin: 2.3 g/dL (calc) (ref 1.9–3.7)
Glucose, Bld: 86 mg/dL (ref 65–99)
Potassium: 3.4 mmol/L — ABNORMAL LOW (ref 3.5–5.3)
Sodium: 137 mmol/L (ref 135–146)
Total Bilirubin: 0.8 mg/dL (ref 0.2–1.2)
Total Protein: 6.9 g/dL (ref 6.1–8.1)

## 2020-03-23 LAB — URINALYSIS, ROUTINE W REFLEX MICROSCOPIC
Bilirubin Urine: NEGATIVE
Glucose, UA: NEGATIVE
Hgb urine dipstick: NEGATIVE
Ketones, ur: NEGATIVE
Leukocytes,Ua: NEGATIVE
Nitrite: NEGATIVE
Protein, ur: NEGATIVE
Specific Gravity, Urine: 1.019 (ref 1.001–1.03)
pH: >=8.5 — AB (ref 5.0–8.0)

## 2020-03-23 LAB — CBC WITH DIFFERENTIAL/PLATELET
Absolute Monocytes: 384 cells/uL (ref 200–950)
Basophils Absolute: 62 cells/uL (ref 0–200)
Basophils Relative: 1.3 %
Eosinophils Absolute: 72 cells/uL (ref 15–500)
Eosinophils Relative: 1.5 %
HCT: 46.4 % — ABNORMAL HIGH (ref 35.0–45.0)
Hemoglobin: 15.5 g/dL (ref 11.7–15.5)
Lymphs Abs: 1920 cells/uL (ref 850–3900)
MCH: 31.8 pg (ref 27.0–33.0)
MCHC: 33.4 g/dL (ref 32.0–36.0)
MCV: 95.3 fL (ref 80.0–100.0)
MPV: 10.2 fL (ref 7.5–12.5)
Monocytes Relative: 8 %
Neutro Abs: 2362 cells/uL (ref 1500–7800)
Neutrophils Relative %: 49.2 %
Platelets: 272 10*3/uL (ref 140–400)
RBC: 4.87 10*6/uL (ref 3.80–5.10)
RDW: 12.2 % (ref 11.0–15.0)
Total Lymphocyte: 40 %
WBC: 4.8 10*3/uL (ref 3.8–10.8)

## 2020-03-23 LAB — HEMOGLOBIN A1C
Hgb A1c MFr Bld: 5.5 % of total Hgb (ref <5.7)
Mean Plasma Glucose: 111 (calc)
eAG (mmol/L): 6.2 (calc)

## 2020-03-23 LAB — MICROALBUMIN / CREATININE URINE RATIO
Creatinine, Urine: 92 mg/dL (ref 20–275)
Microalb Creat Ratio: 3 mcg/mg creat (ref <30)
Microalb, Ur: 0.3 mg/dL

## 2020-03-23 LAB — LIPID PANEL
Cholesterol: 217 mg/dL — ABNORMAL HIGH (ref <200)
HDL: 104 mg/dL (ref >=50)
LDL Cholesterol (Calc): 99 mg/dL (calc)
Non-HDL Cholesterol (Calc): 113 mg/dL (calc) (ref <130)
Total CHOL/HDL Ratio: 2.1 (calc) (ref <5.0)
Triglycerides: 54 mg/dL (ref <150)

## 2020-03-23 LAB — TSH: TSH: 1.53 mIU/L (ref 0.40–4.50)

## 2020-03-23 LAB — VITAMIN D 25 HYDROXY (VIT D DEFICIENCY, FRACTURES): Vit D, 25-Hydroxy: 116 ng/mL — ABNORMAL HIGH (ref 30–100)

## 2020-03-23 LAB — MAGNESIUM: Magnesium: 2.1 mg/dL (ref 1.5–2.5)

## 2020-03-23 MED ORDER — VITAMIN D3 125 MCG (5000 UT) PO CAPS
ORAL_CAPSULE | ORAL | Status: AC
Start: 2020-03-23 — End: ?

## 2020-03-26 NOTE — Addendum Note (Signed)
Addended by: Dionicio Stall on: 03/26/2020 09:16 AM   Modules accepted: Orders

## 2020-04-16 ENCOUNTER — Ambulatory Visit (INDEPENDENT_AMBULATORY_CARE_PROVIDER_SITE_OTHER): Payer: Medicare Other | Admitting: Podiatry

## 2020-04-16 ENCOUNTER — Ambulatory Visit: Payer: Medicare Other | Admitting: Orthotics

## 2020-04-16 ENCOUNTER — Other Ambulatory Visit: Payer: Self-pay

## 2020-04-16 DIAGNOSIS — G5762 Lesion of plantar nerve, left lower limb: Secondary | ICD-10-CM

## 2020-04-16 DIAGNOSIS — M722 Plantar fascial fibromatosis: Secondary | ICD-10-CM

## 2020-04-16 DIAGNOSIS — M79672 Pain in left foot: Secondary | ICD-10-CM

## 2020-04-16 MED ORDER — MELOXICAM 15 MG PO TABS: 15.0000 mg | ORAL_TABLET | Freq: Every day | ORAL | 0 refills | Status: DC

## 2020-04-17 NOTE — Progress Notes (Signed)
Subjective: 69 year old female presents the office today for evaluation of left foot pain.  She states that since last appointment she is doing much better but still in some discomfort.  She states that when she goes barefoot she still notices nerve symptoms to her toe.  She presents to pick up orthotics but she is also interested in another steroid injection. Denies any systemic complaints such as fevers, chills, nausea, vomiting. No acute changes since last appointment, and no other complaints at this time.   Objective: AAO x3, NAD DP/PT pulses palpable bilaterally, CRT less than 3 seconds The majority tenderness today is localized on the second interspace of the left foot.  There is no area of pinpoint tenderness.  No significant discomfort on the insertion or course of plantar fascial.  MMT 5/5.  Positive click consistent with neuroma. No pain with calf compression, swelling, warmth, erythema  Assessment: Left second interspace neuroma, plantar fasciitis  Plan: -All treatment options discussed with the patient including all alternatives, risks, complications.  -Second steroid injection performed.  See procedure note below.  Offloading pads were dispensed.  Prescribe meloxicam to take as needed -Regards to plantar fasciitis discussed traction, icing daily.  Hopefully the orthotics will be helpful with this as well. Mobic -Patient encouraged to call the office with any questions, concerns, change in symptoms.   Procedure: Injection neuroma Discussed alternatives, risks, complications and verbal consent was obtained.  Location: Left second interspace Skin Prep: Alcohol  Injectate: 0.5cc 0.5% marcaine plain, 0.5 cc 2% lidocaine plain and, 1 cc kenalog 10. Disposition: Patient tolerated procedure well. Injection site dressed with a band-aid.  Post-injection care was discussed and return precautions discussed.   Return in about 4 weeks (around 05/14/2020).  Vivi Barrack DPM

## 2020-04-23 ENCOUNTER — Ambulatory Visit: Payer: Medicare Other | Admitting: Orthotics

## 2020-04-23 ENCOUNTER — Other Ambulatory Visit: Payer: Self-pay

## 2020-04-23 DIAGNOSIS — G5762 Lesion of plantar nerve, left lower limb: Secondary | ICD-10-CM

## 2020-04-23 DIAGNOSIS — M722 Plantar fascial fibromatosis: Secondary | ICD-10-CM

## 2020-04-23 NOTE — Progress Notes (Signed)
Removed met pad and added more cushioning to see if we can make f/o more comfortable.  She is rescheduled to come back to see me this week.

## 2020-04-25 ENCOUNTER — Other Ambulatory Visit: Payer: Medicare Other | Admitting: Orthotics

## 2020-04-29 NOTE — Progress Notes (Signed)
Patient came in today to pick up custom made foot orthotics.  The goals were accomplished and the patient reported no dissatisfaction with said orthotics.  Patient was advised of breakin period and how to report any issues. 

## 2020-05-12 ENCOUNTER — Other Ambulatory Visit: Payer: Self-pay | Admitting: Internal Medicine

## 2020-05-12 DIAGNOSIS — G43909 Migraine, unspecified, not intractable, without status migrainosus: Secondary | ICD-10-CM

## 2020-05-12 DIAGNOSIS — J45909 Unspecified asthma, uncomplicated: Secondary | ICD-10-CM

## 2020-05-12 MED ORDER — PREDNISONE 10 MG PO TABS: ORAL_TABLET | ORAL | 0 refills | Status: DC

## 2020-05-16 ENCOUNTER — Ambulatory Visit: Payer: Medicare Other | Admitting: Podiatry

## 2020-06-01 ENCOUNTER — Ambulatory Visit: Payer: Medicare Other | Attending: Internal Medicine

## 2020-06-01 DIAGNOSIS — Z23 Encounter for immunization: Secondary | ICD-10-CM

## 2020-06-01 NOTE — Progress Notes (Signed)
   Covid-19 Vaccination Clinic  Name:  Alexis Weaver    MRN: [id number] DOB: 1951/04/10  06/01/2020  [name] was observed post Covid-19 immunization for 15 minutes without incident. She was provided with Vaccine Information Sheet and instruction to access the V-Safe system.   [name] was instructed to call 911 with any severe reactions post vaccine: Marland Kitchen Difficulty breathing  . Swelling of face and throat  . A fast heartbeat  . A bad rash all over body  . Dizziness and weakness   Immunizations Administered    Name Date Dose VIS Date Route   Pfizer COVID-19 Vaccine 06/01/2020  9:50 AM 0.3 mL 04/03/2020 Intramuscular   Manufacturer: [employer], [employer]   Lot: [id number]   NDC: [address]-0

## 2020-06-24 ENCOUNTER — Other Ambulatory Visit: Payer: Self-pay | Admitting: Internal Medicine

## 2020-06-24 MED ORDER — PROMETHAZINE-DM 6.25-15 MG/5ML PO SYRP: ORAL_SOLUTION | ORAL | 1 refills | Status: DC

## 2020-06-24 MED ORDER — BENZONATATE 200 MG PO CAPS: ORAL_CAPSULE | ORAL | 1 refills | Status: DC

## 2020-06-24 MED ORDER — AZITHROMYCIN 250 MG PO TABS: ORAL_TABLET | ORAL | 1 refills | Status: DC

## 2020-06-24 MED ORDER — DEXAMETHASONE 4 MG PO TABS: ORAL_TABLET | ORAL | 0 refills | Status: DC

## 2020-09-04 ENCOUNTER — Other Ambulatory Visit: Payer: Self-pay | Admitting: Internal Medicine

## 2020-09-04 MED ORDER — BIMATOPROST 0.03 % EX SOLN
CUTANEOUS | 3 refills | Status: DC
Start: 2020-09-04 — End: 2022-04-19

## 2020-09-04 MED ORDER — ONDANSETRON HCL 8 MG PO TABS
ORAL_TABLET | ORAL | 0 refills | Status: DC
Start: 2020-09-04 — End: 2021-08-12

## 2020-09-11 ENCOUNTER — Encounter: Payer: Self-pay | Admitting: Plastic Surgery

## 2020-09-11 ENCOUNTER — Ambulatory Visit (INDEPENDENT_AMBULATORY_CARE_PROVIDER_SITE_OTHER): Payer: Self-pay | Admitting: Plastic Surgery

## 2020-09-11 ENCOUNTER — Other Ambulatory Visit: Payer: Self-pay

## 2020-09-11 VITALS — BP 132/77 | HR 78 | Ht 60.0 in | Wt 107.0 lb

## 2020-09-11 DIAGNOSIS — Z411 Encounter for cosmetic surgery: Secondary | ICD-10-CM

## 2020-09-11 NOTE — Progress Notes (Signed)
Patient presents to discuss nonsurgical facial rejuvenation.  She is had Botox and filler in the past.  She is interested in treatments for the static and dynamic lines of her forehead, glabella, crows feet, upper lips and chin areas.  She is also interested in filler primarily in the nasolabial folds in the cheeks.  We discussed a variety of options but ultimately she is interested in Botox treatment for the forehead, glabella and crows feet.  We discussed the risks and benefits of both of Botox treatment which she fully understands.  The forehead, glabella and crows feet were prepped with an alcohol pad and 36 units of Botox was distributed.  She tolerated this well.  She is going to consider filler for the nasolabial folds and cheeks and is also interested in fractional laser resurfacing.  I have offered her a 2-week touchup appointment if she is interested and she can cancel if its not needed.  All of her questions were answered.

## 2020-09-12 ENCOUNTER — Other Ambulatory Visit: Payer: Self-pay | Admitting: Internal Medicine

## 2020-10-06 ENCOUNTER — Encounter: Payer: Self-pay | Admitting: Internal Medicine

## 2020-10-06 DIAGNOSIS — E782 Mixed hyperlipidemia: Secondary | ICD-10-CM | POA: Insufficient documentation

## 2020-10-06 NOTE — Patient Instructions (Signed)

## 2020-10-06 NOTE — Progress Notes (Signed)
Future Appointments  Date Time Provider Department Center  10/07/2020  9:30 AM [name], MD [facility] None  12/27/2020 10:30 AM Judd Gaudier, NP [facility] None  03/31/2021  9:00 AM Judd Gaudier, NP [facility] None     History of Present Illness:       This very nice 70 y.o. MWF  presents for 6 month follow up with HTN, HLD, Pre-Diabetes, hx/o Raynaud's,  and Vitamin D Deficiency. Patient reports her migraines have been controlled on current regimen. 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        Today,  patient also presents with concerns re: toenail fungus of the 1st & 2sd toes bilaterally that she desires treatment. Further she related sx's of aching in hands & fingers. Has had negative w/u for RA & autoimmune dz's in the past .       Patient is followed expectantly for labile HTN controlled on daily Maxide  & BP has been controlled at home. Today's BP is at goal - 116/68. Patient has had no complaints of any cardiac type chest pain, palpitations, dyspnea / orthopnea / PND, dizziness, claudication, or dependent edema.         Hyperlipidemia is controlled with diet & meds. Patient denies myalgias or other med SE's. Last Lipids were at goal with a very high HDL (104):  Lab Results  Component Value Date   CHOL 217 (H) 03/22/2020   HDL 104 03/22/2020   LDLCALC 99 03/22/2020   TRIG 54 03/22/2020   CHOLHDL 2.1 03/22/2020     Also, the patient has history of PreDiabetes (A1c 5.9% /2015 and 6.0% /2016) and has had no symptoms of reactive hypoglycemia, diabetic polys, paresthesias or visual blurring.  Last A1c was normal & at goal:  Lab Results  Component Value Date   HGBA1C 5.5 03/22/2020            Further, the patient also has history of Vitamin D Deficiency ("40" /2016) and supplements vitamin D without any suspected side-effects. Last vitamin D was sl  elevated & advised taper Vit D 5,000 /daily to 4-5 x/week:  Lab Results  Component Value Date   VD25OH 116 (H) 03/22/2020    Current Outpatient Medications on File Prior to Visit  Medication Sig  . aspirin EC 81 MG tablet Take  daily.  . bimatoprost (LATISSE)  ophth soln APPLY EYELASHES  DAILY   . VITAMIN D (5000 UT) CAPS Take 1 cap 4-5 days per week  . RESTASIS 0.05 % ophth emulsion Place 1 drop into both eyes 2 (two) times daily.  . RELPAX 40 MG tablet Take 1 tablet for Migraine & may repeat 1 x in 2 hours   . fexofenadine  180 MG tablet Take  daily.  Marland Kitchen ibuprofen  800 MG tablet Take 1 tablet  every 8 hours as needed for moderate pain  . LINZESS 145 MCG CAPS TAKE 1 CAPSULE DAILY   . magnesium  500 MG tablet Take 500 mg daily.  Marland Kitchen SAVELLA 100 MG TABS TAKE 2 TABLETS  DAILY FOR MOOD  . Omega-3  FISH OIL  Take 1 table  daily.  Marland Kitchen ZOFRAN 8 MG tablet Take 1 tablet 2 to 3 x /day as needed for Nausea / Emesis  . Apple Cider Vinegar -Take daily  . Vitamin C  daily.  Hassan Buckler  daily  . Zinc  daily  . Vitamin B12  sublingual daily  . SENOKOT  Take 1 tablet  as needed.   . traZODone 50 MG tablet Take 1 tablet  at bedtime as needed for sleep.  Marland Kitchen triamterene-hctz 75-50 MG tablet Take 1 tablet Daily      Allergies  Allergen Reactions  . Hydrocodone-Acetaminophen Shortness Of Breath  . Codeine Hives  . Morphine And Related Hives  . Nitrofurantoin Other (See Comments)    Makes hands and feet burn  . Tetracycline     PMHx:   Past Medical History:  Diagnosis Date  . Allergy   . Anemia 2016  . Anxiety   . Asthma   . Chronic headaches   . Chronic sinusitis   . Depression   . Fibromyalgia   . GERD (gastroesophageal reflux disease)   . IBS (irritable bowel syndrome)   . Meckel diverticulum 1997  . Meckel's diverticulum   . Migraines 03/22/2020  . Neuromuscular disorder (HCC)    fibromyagia  . Osteopenia     Immunization History  Administered Date(s) Administered  . DT  (Pediatric) 11/06/2004  . Influenza, High Dose Seasonal PF 03/23/2019, 03/22/2020  . Influenza 02/14/2012  . PFIZER SARS-COV-2 Vacc  10/12/2019, 11/06/2019, 06/01/2020  . Pneumococcal - 13 10/26/2017  . Pneumococcal - 23 03/23/2019  . Tdap 02/26/2011    Past Surgical History:  Procedure Laterality Date  . ABDOMINAL HYSTERECTOMY     tubes/ovaries spared  . APPENDECTOMY  1997  . BREAST ENHANCEMENT SURGERY    . CESAREAN SECTION  1979, 1983   x 2  . HEMORRHOID BANDING    . LAPAROSCOPIC ABDOMINAL EXPLORATION     peritonitis, appendectomy    FHx:    Reviewed / unchanged  SHx:    Reviewed / unchanged   Systems Review:  Constitutional: Denies fever, chills, wt changes, headaches, insomnia, fatigue, night sweats, change in appetite. Eyes: Denies redness, blurred vision, diplopia, discharge, itchy, watery eyes.  ENT: Denies discharge, congestion, post nasal drip, epistaxis, sore throat, earache, hearing loss, dental pain, tinnitus, vertigo, sinus pain, snoring.  CV: Denies chest pain, palpitations, irregular heartbeat, syncope, dyspnea, diaphoresis, orthopnea, PND, claudication or edema. Respiratory: denies cough, dyspnea, DOE, pleurisy, hoarseness, laryngitis, wheezing.  Gastrointestinal: Denies dysphagia, odynophagia, heartburn, reflux, water brash, abdominal pain or cramps, nausea, vomiting, bloating, diarrhea, constipation, hematemesis, melena, hematochezia  or hemorrhoids. Genitourinary: Denies dysuria, frequency, urgency, nocturia, hesitancy, discharge, hematuria or flank pain. Musculoskeletal: Denies arthralgias, myalgias, stiffness, jt. swelling, pain, limping or strain/sprain.  Skin: Denies pruritus, rash, hives, warts, acne, eczema or change in skin lesion(s). Neuro: No weakness, tremor, incoordination, spasms, paresthesia or pain. Psychiatric: Denies confusion, memory loss or sensory loss. Endo: Denies change in weight, skin or hair change.  Heme/Lymph: No excessive bleeding,  bruising or enlarged lymph nodes.  Physical Exam  BP 116/68   Pulse 84   Temp (!) 97.3 F (36.3 C)   Resp 16   Ht 5' 1.5" (1.562 m)   Wt 110 lb 12.8 oz (50.3 kg)   SpO2 99%   BMI 20.60 kg/m   Appears  well nourished, well groomed  and in no distress.  Eyes: PERRLA, EOMs, conjunctiva no swelling or erythema. Sinuses: No frontal/maxillary tenderness ENT/Mouth: EAC's clear, TM's nl w/o erythema, bulging. Nares clear w/o erythema, swelling, exudates. Oropharynx clear without erythema or exudates. Oral hygiene is good. Tongue normal, non obstructing. Hearing intact.  Neck: Supple. Thyroid not palpable. Car 2+/2+ without bruits, nodes or JVD.  Chest: Respirations nl with BS clear & equal w/o rales, rhonchi, wheezing or stridor.  Cor: Heart sounds normal w/ regular rate and rhythm without sig. murmurs, gallops, clicks or rubs. Peripheral pulses normal and equal  without edema.  Abdomen: Soft & bowel sounds normal. Non-tender w/o guarding, rebound, hernias, masses or organomegaly.  Lymphatics: Unremarkable.  Musculoskeletal: Full ROM all peripheral extremities, joint stability, 5/5 strength and normal gait.  Skin: Warm, dry without exposed rashes, lesions or ecchymosis apparent. Yellowish chalky thickening of the Bilat 1st & 2sd toenails. Neuro: Cranial nerves intact, reflexes equal bilaterally. Sensory-motor testing grossly intact. Tendon reflexes grossly intact.  Pysch: Alert & oriented x 3.  Insight and judgement nl & appropriate. No ideations.  Assessment and Plan:  1. Labile hypertension  - Continue medication, monitor blood pressure at home.  - Continue DASH diet.  Reminder to go to the ER if any CP,  SOB, nausea, dizziness, severe HA, changes vision/speech.  - CBC with Differential/Platelet - COMPLETE METABOLIC PANEL WITH GFR - Magnesium - TSH  2. Hyperlipidemia, mixed  - Continue diet/meds, exercise,& lifestyle modifications.  - Continue monitor periodic cholesterol/liver  & renal functions   - Lipid panel - TSH  3. Abnormal glucose  - Continue diet, exercise  - Lifestyle modifications.  - Monitor appropriate labs  - Hemoglobin A1c - Insulin, random  4. Vitamin D deficiency  - Continue supplementation.  - VITAMIN D 25 Hydroxy  5. Migraine    6. Medication management  - CBC with Differential/Platelet - COMPLETE METABOLIC PANEL WITH GFR - Magnesium - Lipid panel - TSH - Hemoglobin A1c - Insulin, random - VITAMIN D 25 Hydroxy        Discussed  regular exercise, BP monitoring, weight control to achieve/maintain BMI less than 25 and discussed med and SE's. Recommended labs to assess and monitor clinical status with further disposition pending results of labs.  I discussed the assessment and treatment plan with the patient.  Rx 'd Lamisil for toenails and discussed meds & SE's. The patient was provided an opportunity to ask questions and all were answered. The patient agreed with the plan and demonstrated an understanding of the instructions.  I provided over 30 minutes of exam, counseling, chart review and  complex critical decision making.       The patient was advised to call back or seek an in-person evaluation if the symptoms worsen or if the condition fails to improve as anticipated.   Marinus Maw, MD

## 2020-10-07 ENCOUNTER — Other Ambulatory Visit: Payer: Self-pay

## 2020-10-07 ENCOUNTER — Ambulatory Visit (INDEPENDENT_AMBULATORY_CARE_PROVIDER_SITE_OTHER): Payer: Medicare Other | Admitting: Internal Medicine

## 2020-10-07 VITALS — BP 116/68 | HR 84 | Temp 97.3°F | Resp 16 | Ht 61.5 in | Wt 110.8 lb

## 2020-10-07 DIAGNOSIS — R7309 Other abnormal glucose: Secondary | ICD-10-CM | POA: Diagnosis not present

## 2020-10-07 DIAGNOSIS — G43909 Migraine, unspecified, not intractable, without status migrainosus: Secondary | ICD-10-CM

## 2020-10-07 DIAGNOSIS — E782 Mixed hyperlipidemia: Secondary | ICD-10-CM

## 2020-10-07 DIAGNOSIS — Z79899 Other long term (current) drug therapy: Secondary | ICD-10-CM

## 2020-10-07 DIAGNOSIS — B351 Tinea unguium: Secondary | ICD-10-CM

## 2020-10-07 DIAGNOSIS — E559 Vitamin D deficiency, unspecified: Secondary | ICD-10-CM

## 2020-10-07 DIAGNOSIS — R0989 Other specified symptoms and signs involving the circulatory and respiratory systems: Secondary | ICD-10-CM | POA: Diagnosis not present

## 2020-10-07 DIAGNOSIS — F909 Attention-deficit hyperactivity disorder, unspecified type: Secondary | ICD-10-CM

## 2020-10-07 MED ORDER — AMPHETAMINE-DEXTROAMPHET ER 10 MG PO CP24: ORAL_CAPSULE | ORAL | 0 refills | Status: DC

## 2020-10-07 MED ORDER — TERBINAFINE HCL 250 MG PO TABS: ORAL_TABLET | ORAL | 0 refills | Status: DC

## 2020-10-08 LAB — LIPID PANEL
Cholesterol: 213 mg/dL — ABNORMAL HIGH (ref <200)
HDL: 98 mg/dL (ref >=50)
LDL Cholesterol (Calc): 100 mg/dL (calc) — ABNORMAL HIGH
Non-HDL Cholesterol (Calc): 115 mg/dL (calc) (ref <130)
Total CHOL/HDL Ratio: 2.2 (calc) (ref <5.0)
Triglycerides: 63 mg/dL (ref <150)

## 2020-10-08 LAB — CBC WITH DIFFERENTIAL/PLATELET
Absolute Monocytes: 490 cells/uL (ref 200–950)
Basophils Absolute: 71 cells/uL (ref 0–200)
Basophils Relative: 1.2 %
Eosinophils Absolute: 118 cells/uL (ref 15–500)
Eosinophils Relative: 2 %
HCT: 41 % (ref 35.0–45.0)
Hemoglobin: 13.8 g/dL (ref 11.7–15.5)
Lymphs Abs: 2401 cells/uL (ref 850–3900)
MCH: 32.2 pg (ref 27.0–33.0)
MCHC: 33.7 g/dL (ref 32.0–36.0)
MCV: 95.6 fL (ref 80.0–100.0)
MPV: 10.9 fL (ref 7.5–12.5)
Monocytes Relative: 8.3 %
Neutro Abs: 2820 cells/uL (ref 1500–7800)
Neutrophils Relative %: 47.8 %
Platelets: 258 10*3/uL (ref 140–400)
RBC: 4.29 10*6/uL (ref 3.80–5.10)
RDW: 12.5 % (ref 11.0–15.0)
Total Lymphocyte: 40.7 %
WBC: 5.9 10*3/uL (ref 3.8–10.8)

## 2020-10-08 LAB — COMPLETE METABOLIC PANEL WITH GFR
AG Ratio: 2 (calc) (ref 1.0–2.5)
ALT: 20 U/L (ref 6–29)
AST: 23 U/L (ref 10–35)
Albumin: 4.4 g/dL (ref 3.6–5.1)
Alkaline phosphatase (APISO): 50 U/L (ref 37–153)
BUN: 19 mg/dL (ref 7–25)
CO2: 33 mmol/L — ABNORMAL HIGH (ref 20–32)
Calcium: 9.4 mg/dL (ref 8.6–10.4)
Chloride: 97 mmol/L — ABNORMAL LOW (ref 98–110)
Creat: 0.92 mg/dL (ref 0.50–0.99)
GFR, Est African American: 74 mL/min/{1.73_m2} (ref >=60)
GFR, Est Non African American: 64 mL/min/{1.73_m2} (ref >=60)
Globulin: 2.2 g/dL (calc) (ref 1.9–3.7)
Glucose, Bld: 118 mg/dL — ABNORMAL HIGH (ref 65–99)
Potassium: 3.7 mmol/L (ref 3.5–5.3)
Sodium: 138 mmol/L (ref 135–146)
Total Bilirubin: 0.6 mg/dL (ref 0.2–1.2)
Total Protein: 6.6 g/dL (ref 6.1–8.1)

## 2020-10-08 LAB — HEMOGLOBIN A1C
Hgb A1c MFr Bld: 5.6 % of total Hgb (ref <5.7)
Mean Plasma Glucose: 114 mg/dL
eAG (mmol/L): 6.3 mmol/L

## 2020-10-08 LAB — INSULIN, RANDOM: Insulin: 15.4 u[IU]/mL

## 2020-10-08 LAB — MAGNESIUM: Magnesium: 1.6 mg/dL (ref 1.5–2.5)

## 2020-10-08 LAB — VITAMIN D 25 HYDROXY (VIT D DEFICIENCY, FRACTURES): Vit D, 25-Hydroxy: 101 ng/mL — ABNORMAL HIGH (ref 30–100)

## 2020-10-08 LAB — TSH: TSH: 2.4 mIU/L (ref 0.40–4.50)

## 2020-10-08 NOTE — Progress Notes (Signed)
============================================================ -   Test results slightly outside the reference range are not unusual. If there is anything important, I will review this with you,  otherwise it is considered normal test values.  If you have further questions,  please do not hesitate to contact me at the office or via My Chart.  ============================================================ ============================================================  -   Magnesium  -   1.6  -  very  low- goal is betw 2.0 - 2.5,   - So..............Marland Kitchen  Recommend that you INCREASE Magnesium 500 mg tablet x 2 tablets /daily   - also important to eat lots of  leafy green vegetables   - spinach - Kale - collards - greens - okra - asparagus  - broccoli - quinoa - squash - almonds   - black, red, white beans  -  peas - green beans ============================================================ ============================================================  -  Total Chol 213 - OK since have such a high good HDL of 98 !  Excellent   - Very low risk for Heart Attack  / Stroke ============================================================ ============================================================  -  A1c - Normal - Great  - No Diabetes  ============================================================ ============================================================  -  Vitamin D = 101 - Excellent - Please keep dose same  ============================================================ ============================================================  -  All Else - CBC - Kidneys - Electrolytes - Liver & Thyroid    - all  Normal / OK ============================================================ ============================================================  -  Keep up the [place] Work  ! ============================================================ ============================================================

## 2020-11-11 ENCOUNTER — Other Ambulatory Visit: Payer: Self-pay | Admitting: Internal Medicine

## 2020-11-12 ENCOUNTER — Other Ambulatory Visit: Payer: Self-pay | Admitting: Internal Medicine

## 2020-11-12 DIAGNOSIS — B351 Tinea unguium: Secondary | ICD-10-CM

## 2020-11-18 ENCOUNTER — Ambulatory Visit: Payer: Medicare Other

## 2020-12-25 LAB — HM DEXA SCAN

## 2020-12-25 LAB — HM MAMMOGRAPHY

## 2020-12-26 NOTE — Progress Notes (Signed)
MEDICARE WELLNESS AND FOLLOW UP  Assessment:   Annual Medicare Wellness Visit Due annually  Health maintenance reviewed  Labile hypertension Well controlled off of meds at this time Monitor blood pressure at home; call if consistently over 130/80 Continue DASH diet, lifestyle discussed  Reminder to go to the ER if any CP, SOB, nausea, dizziness, severe HA, changes vision/speech, left arm numbness and tingling and jaw pain.  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 Evelene CroonKaur is managing, hx of trauma Continue medications; declines any changes  Lifestyle discussed: diet/exerise, sleep hygiene, stress management, hydration No SI/HI   IBS (irritable bowel syndrome)-constipation type Try increase linzess 290 mg daily; PRN senokot and stool softener Discussed lifestyle;  Likely also some dysbiosis;  given FODMAP information, suggested reading "Fiber fueled" by Dr. Lowanda FosterWill Bulsiewicz, discussed trial of FODMAP elimination diet and reintroduction if benefit.  Suggested prebiotic fiber supplement;  May benefit from short trial of probiotic; "GI stability - standard process" and "Align" suggested, 1-3 months, d/c if no benefit and can repeat PRN.   Asthma, chronic, unspecified asthma severity, uncomplicated Continue medications;   Migraines Recently improved; has seen neuro; rare eletriptan  Raynaud's  Didn't tolerate nifedipine, labile low  BPs She reports doing well with lifestyle changes   Orders Placed This Encounter  Procedures   CBC with Differential/Platelet   COMPLETE METABOLIC PANEL WITH GFR   TSH   Lipid panel    Over 30 minutes of exam, counseling, chart review and critical decision making was performed Future Appointments  Date Time Provider Department Center  03/31/2021  9:00 AM Judd Gaudierorbett, Tonia Avino, NP [facility] None     Plan:   During the course of the visit the patient was educated and counseled about appropriate screening and preventive services including:   Pneumococcal vaccine  Prevnar 13 Influenza vaccine Td vaccine Screening electrocardiogram Bone densitometry screening Colorectal cancer screening Diabetes screening Glaucoma screening Nutrition counseling  Advanced directives: requested   Subjective:  Leim FabryDeborah S Bankson is a 70 y.o. female who presents for AWV and follow up. She has Palpitations; Asthma, chronic; Chronic sinusitis; Chronic anxiety; Major depression in partial remission (HCC); Osteopenia; Abnormal glucose; Medication management; IBS (irritable bowel syndrome)-constipation type; Vitamin D deficiency; Labile hypertension; Migraines; Raynaud's phenomenon without gangrene; Raynaud disease; and Hyperlipidemia, mixed on their problem list.   She has tender pressure points, had negative ANA, AntiDNA, RF.  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   She reports long history of migraines; has seen neuro; much improved since retiring; she does have abortive relpax which works well.   She was seeing a therapist and Dr. Evelene CroonKaur for chronic depression following trauma in childhood, son with TBI/addiction, taking trazodone for sleep, has been taking adderall XR 10 mg daily. "I'm doing as well as can be expected."  Has seen Dr. Ezzard StandingNewman and Easton CallasSharma for allergies/asthma but shots didn't help.  She is on allegra, and prednisone intermittent, reports has had persistent  sinus tenderness x 6  weeks not improving.   On linzess for IBS-C, on 145 mg with sennokot with some initial improvement but more constipation again, interested in trying higher dose. She had normal colonoscopy by Dr. Russella Dar in 2017.   She has reported episodic red/blue/white discoloration and coolness of extremities and underwent LE vascular US which was unremarkable; felt to be Raynaud's, was prescribed procardia but was unable to tolerate low BP, prefers managing with lifestyle.  BMI is Body mass index is 19.33 kg/m., she has been working on diet and exercise, exercises 60+ min at least 5 days a week.  Wt Readings from Last 3 Encounters:  12/27/20 104 lb (47.2 kg)  10/07/20 110 lb 12.8 oz (50.3 kg)  09/11/20 107 lb (48.5 kg)    Her blood pressure has been controlled at home (labile, ranges 110/60-rare low 140s/80s), today their BP is BP: 128/82 She does workout. She denies chest pain, shortness of breath, dizziness.    She is not on cholesterol medication and denies myalgias. Her cholesterol is at goal. The cholesterol last visit was:   Lab Results  Component Value Date   CHOL 213 (H) 10/07/2020   HDL 98 10/07/2020   LDLCALC 100 (H) 10/07/2020   TRIG 63 10/07/2020   CHOLHDL 2.2 10/07/2020   She has hx of prediabetes (A1C 6.0 in 2016, 5.8 in 2020). She has been working on lifestyle for glucose management. Last A1C:  Lab Results  Component Value Date   HGBA1C 5.6 10/07/2020   Lab Results  Component Value Date   GFRNONAA 64 10/07/2020   Patient is on Vitamin D supplement, did reduce from 10000 IU to 5000 IU   Lab Results  Component Value Date   VD25OH 101 (H) 10/07/2020       Medication Review: Current Outpatient Medications on File Prior to Visit  Medication Sig Dispense Refill   amphetamine-dextroamphetamine (ADDERALL XR) 10 MG 24 hr capsule Take 1 capsule Daily for ADD  per Dr Evelene Croon  0   aspirin EC 81 MG tablet Take 81 mg by mouth daily.     bimatoprost (LATISSE) 0.03 %  ophthalmic solution PLACE 1 DROP ON APPLICATOR AND APPLY EVENLY ALONG SKIN OF UPPER EYELIDS AT BASE OF EYELASHES ONCE DAILY AT BEDTIME 15 mL 3   Cholecalciferol (VITAMIN D3) 125 MCG (5000 UT) CAPS Take 1 cap 4-5 days per week     cycloSPORINE (RESTASIS) 0.05 % ophthalmic emulsion Place 1 drop into both eyes 2 (two) times daily.     Efinaconazole (JUBLIA) 10 % SOLN Apply topically daily.     eletriptan (RELPAX) 40 MG tablet Take 1 tablet for Migraine & may repeat 1 x in 2 hours (Maximum 2 tabs /24 hours) 6 tablet 5   fexofenadine (ALLEGRA) 180 MG tablet Take 180 mg by mouth daily.     ibuprofen (ADVIL) 800 MG tablet Take 1 tablet (800 mg total) by mouth every 8 (eight) hours as needed for moderate pain (take for 2 weeks with food, can take with tylenol). 90 tablet 0   magnesium gluconate (MAGONATE) 500 MG tablet Take 500 mg by mouth daily.     ondansetron (ZOFRAN) 8 MG tablet Take1 tablet 2 to 3 x /day as needed for Nausea / Emesis 100 tablet 0   OVER THE COUNTER MEDICATION Apple Cider Vinegar-Take daily     OVER THE COUNTER MEDICATION 500 mg. Vitamin C daily.     OVER THE COUNTER MEDICATION Cinnamon daily     OVER THE COUNTER MEDICATION Zinc  50 mg daily     Sennosides (SENOKOT PO) Take 1 tablet by mouth as needed.      traZODone (DESYREL) 50 MG tablet Take 1 tablet (50 mg total) by mouth at bedtime as needed for sleep. 90 tablet 1   triamterene-hydrochlorothiazide (MAXZIDE) 75-50 MG tablet TAKE 1 TABLET BY MOUTH DAILY FOR BLOOD PRESSURE AND FLUID RETENTION 90 tablet 3   No current facility-administered medications on file prior to visit.    Allergies  Allergen Reactions   Hydrocodone-Acetaminophen Shortness Of Breath   Lamisil [Terbinafine] Rash   Codeine Hives   Morphine And Related Hives   Nitrofurantoin Other (See Comments)    Makes hands and feet burn   Tetracycline     Current Problems (verified) Patient Active Problem List   Diagnosis Date Noted   Hyperlipidemia, mixed  10/06/2020   Migraines 03/22/2020   Raynaud's phenomenon without gangrene 03/22/2020   Raynaud disease 03/22/2020   Labile hypertension 07/07/2017   Abnormal glucose 06/26/2014   Medication management 06/26/2014   IBS (irritable bowel syndrome)-constipation type 06/26/2014   Vitamin D deficiency 06/26/2014   Asthma, chronic    Chronic sinusitis    Chronic anxiety    Major depression in partial remission (HCC)    Osteopenia    Palpitations 04/01/2010    Screening Tests Immunization History  Administered Date(s) Administered   DT (Pediatric) 11/06/2004   Influenza, High Dose Seasonal PF 03/23/2019, 03/22/2020   Influenza-Unspecified 02/14/2012   PFIZER(Purple Top)SARS-COV-2 Vaccination 10/12/2019, 11/06/2019, 06/01/2020   Pneumococcal Conjugate-13 10/26/2017   Pneumococcal Polysaccharide-23 03/23/2019   Tdap 02/26/2011    Tetanus: 2012 - patient declines today  Pneumovax: 2020 Prevnar 13: 10/2017 Flu vaccine: 2021 Shingrix: check with insurance  Covid 19: 2/2, 2021, pfizer + booster  Pap: 2016, declines another, never abnormal pap  MGM: 06/2019 [employer] - reports has had, pending report DEXA: 12/2017 R fem T -2.3 - - reports had, pending report  Colonoscopy: 2017  Dr. Russella Dar 1- years EGD: 10/2015  Names of Other Physician/Practitioners you currently use: 1. [facility] here for primary care 2. Dr. Emily Filbert, eye doctor, last visit 2020 has upcoming in 06/2021 3. Dr. Fara Olden, dentist, last visit 2022, goes q53m  4. Dr. Terri Piedra, derm, last 2021  Patient Care Team: [name], MD as PCP - General (Internal Medicine) Sidney Ace, MD as Referring Physician (Allergy) Marcene Corning, MD as Consulting Physician (Orthopedic Surgery)  SURGICAL HISTORY She  has a past surgical history that includes Abdominal hysterectomy; Cesarean section (1979, 1983); Breast enhancement surgery; Laparoscopic abdominal exploration; Hemorrhoid banding; and  Appendectomy (1997). FAMILY HISTORY Her family history includes Asthma in her brother; COPD in her brother; Diabetes in her brother and father; Diverticulitis in her maternal grandmother; Hyperlipidemia in her brother; Hypertension in her father and mother; Rheum arthritis in her sister. SOCIAL HISTORY She  reports that she has never smoked. She has never used smokeless tobacco. She reports that she does not drink alcohol and does not use drugs.   Review of Systems  Constitutional:  Negative for chills, diaphoresis, fever, malaise/fatigue and weight loss.  HENT:  Negative for congestion, ear discharge, ear pain, hearing loss, nosebleeds, sore throat and tinnitus.   Eyes: Negative.   Respiratory:  Negative for cough, hemoptysis, sputum production, shortness of breath, wheezing and stridor.   Cardiovascular: Negative.   Gastrointestinal:  Positive for constipation. Negative for abdominal pain, blood in stool, diarrhea, heartburn, melena, nausea and vomiting.  Genitourinary:  Negative for dysuria,  flank pain, frequency, hematuria and urgency.  Musculoskeletal:  Negative for back pain, falls, joint pain, myalgias and neck pain.  Skin: Negative.   Neurological: Negative.  Negative for weakness and headaches.  Psychiatric/Behavioral:  Positive for depression. Negative for hallucinations, memory loss, substance abuse and suicidal ideas. The patient is not nervous/anxious.     Objective:     Today's Vitals   12/27/20 1031  BP: 128/82  Pulse: 72  Temp: (!) 97.5 F (36.4 C)  SpO2: 96%  Weight: 104 lb (47.2 kg)   Body mass index is 19.33 kg/m.  General appearance: alert, no distress, WD/WN, female HEENT: normocephalic, sclerae anicteric, TMs pearly, nares patent, no discharge or erythema, pharynx normal Oral cavity: MMM, no lesions Neck: supple, no lymphadenopathy, no thyromegaly, no masses Heart: RRR, normal S1, S2, no murmurs Lungs: CTA bilaterally, no wheezes, rhonchi, or  rales Breasts: Recent normal mammogram, doing self exams, declines today  Abdomen: +bs, soft, non tender, non distended, no masses, no hepatomegaly, no splenomegaly Musculoskeletal: nontender, no swelling, no obvious deformity. Extremities: no edema, no cyanosis, no clubbing Pulses: 2+ symmetric, upper and lower extremities, normal cap refill Neurological: alert, oriented x 3, CN2-12 intact, strength normal upper extremities and lower extremities, sensation normal throughout, DTRs 2+ throughout, no cerebellar signs, gait normal Psychiatric: normal affect, behavior normal, pleasant  GU: defer  EKG: sinus arrhythmia; No st changes; WNL  Dan Maker, NP   12/27/2020

## 2020-12-27 ENCOUNTER — Encounter: Payer: Self-pay | Admitting: Adult Health

## 2020-12-27 ENCOUNTER — Other Ambulatory Visit: Payer: Self-pay

## 2020-12-27 ENCOUNTER — Ambulatory Visit (INDEPENDENT_AMBULATORY_CARE_PROVIDER_SITE_OTHER): Payer: Medicare Other | Admitting: Adult Health

## 2020-12-27 VITALS — BP 128/82 | HR 72 | Temp 97.5°F | Wt 104.0 lb

## 2020-12-27 DIAGNOSIS — R6889 Other general symptoms and signs: Secondary | ICD-10-CM | POA: Diagnosis not present

## 2020-12-27 DIAGNOSIS — G43909 Migraine, unspecified, not intractable, without status migrainosus: Secondary | ICD-10-CM | POA: Diagnosis not present

## 2020-12-27 DIAGNOSIS — J320 Chronic maxillary sinusitis: Secondary | ICD-10-CM | POA: Diagnosis not present

## 2020-12-27 DIAGNOSIS — Z0000 Encounter for general adult medical examination without abnormal findings: Secondary | ICD-10-CM

## 2020-12-27 DIAGNOSIS — F419 Anxiety disorder, unspecified: Secondary | ICD-10-CM

## 2020-12-27 DIAGNOSIS — Z6820 Body mass index (BMI) 20.0-20.9, adult: Secondary | ICD-10-CM

## 2020-12-27 DIAGNOSIS — I7300 Raynaud's syndrome without gangrene: Secondary | ICD-10-CM

## 2020-12-27 DIAGNOSIS — E559 Vitamin D deficiency, unspecified: Secondary | ICD-10-CM

## 2020-12-27 DIAGNOSIS — R0989 Other specified symptoms and signs involving the circulatory and respiratory systems: Secondary | ICD-10-CM

## 2020-12-27 DIAGNOSIS — M8580 Other specified disorders of bone density and structure, unspecified site: Secondary | ICD-10-CM

## 2020-12-27 DIAGNOSIS — Z79899 Other long term (current) drug therapy: Secondary | ICD-10-CM

## 2020-12-27 DIAGNOSIS — E782 Mixed hyperlipidemia: Secondary | ICD-10-CM

## 2020-12-27 DIAGNOSIS — F3341 Major depressive disorder, recurrent, in partial remission: Secondary | ICD-10-CM

## 2020-12-27 DIAGNOSIS — R7309 Other abnormal glucose: Secondary | ICD-10-CM

## 2020-12-27 DIAGNOSIS — R002 Palpitations: Secondary | ICD-10-CM

## 2020-12-27 DIAGNOSIS — Z0001 Encounter for general adult medical examination with abnormal findings: Secondary | ICD-10-CM | POA: Diagnosis not present

## 2020-12-27 DIAGNOSIS — K581 Irritable bowel syndrome with constipation: Secondary | ICD-10-CM

## 2020-12-27 DIAGNOSIS — J45909 Unspecified asthma, uncomplicated: Secondary | ICD-10-CM

## 2020-12-27 MED ORDER — SAVELLA 100 MG PO TABS: ORAL_TABLET | ORAL | 3 refills | Status: DC

## 2020-12-27 MED ORDER — PREDNISONE 20 MG PO TABS: ORAL_TABLET | ORAL | 0 refills | Status: DC

## 2020-12-27 MED ORDER — LINACLOTIDE 290 MCG PO CAPS: ORAL_CAPSULE | ORAL | 3 refills | Status: DC

## 2020-12-27 MED ORDER — AMOXICILLIN-POT CLAVULANATE 875-125 MG PO TABS: 1.0000 | ORAL_TABLET | Freq: Two times a day (BID) | ORAL | 0 refills | Status: AC

## 2020-12-27 NOTE — Patient Instructions (Signed)
    Suggested reading "Fiber fueled" by Dr. Lowanda Foster, discussed trial of FODMAP elimination diet and reintroduction if benefit.   Suggested prebiotic fiber supplement;  May benefit from short trial of probiotic; "GI stability - standard process" and "Align" suggested, 1-3 months, d/c if no benefit and can repeat PRN.       Consider keeping a food diary-  FODMAP stands for fermentable oligo-, di-, mono-saccharides and polyols (1). These are the scientific terms used to classify groups of carbs that are notorious for triggering digestive symptoms like bloating, gas and stomach pain.   FODMAPs are found in a wide range of foods in varying amounts. Some foods contain just one type, while others contain several.  The main dietary sources of the four groups of FODMAPs include:  Oligosaccharides: Wheat, rye, legumes and various fruits and vegetables, such as garlic and onions.  Disaccharides: Milk, yogurt and soft cheese. Lactose is the main carb.  Monosaccharides: Various fruit including figs and mangoes, and sweeteners such as honey and agave nectar. Fructose is the main carb.  Polyols: Certain fruits and vegetables including blackberries and lychee, as well as some low-calorie sweeteners like those in sugar-free gum.   Keep a food diary. This will help you identify foods that cause symptoms. Write down: What you eat and when. What symptoms you have. When symptoms occur in relation to your meals. Avoid foods that cause symptoms. Talk with your dietitian about other ways to get the same nutrients that are in these foods. Eat your meals slowly, in a relaxed setting. Aim to eat 5-6 small meals per day. Do not skip meals. Drink enough fluids to keep your urine clear or pale yellow. Ask your health care provider if you should take an over-the-counter probiotic during flare-ups to help restore healthy gut bacteria. If you have cramping or diarrhea, try making your meals low in fat and  high in carbohydrates. Examples of carbohydrates are pasta, rice, whole grain breads and cereals, fruits, and vegetables. If dairy products cause your symptoms to flare up, try eating less of them. You might be able to handle yogurt better than other dairy products because it contains bacteria that help with digestion.

## 2020-12-28 LAB — COMPLETE METABOLIC PANEL WITH GFR
AG Ratio: 2.3 (calc) (ref 1.0–2.5)
ALT: 16 U/L (ref 6–29)
AST: 18 U/L (ref 10–35)
Albumin: 4.6 g/dL (ref 3.6–5.1)
Alkaline phosphatase (APISO): 50 U/L (ref 37–153)
BUN: 12 mg/dL (ref 7–25)
CO2: 32 mmol/L (ref 20–32)
Calcium: 9.7 mg/dL (ref 8.6–10.4)
Chloride: 101 mmol/L (ref 98–110)
Creat: 0.76 mg/dL (ref 0.50–1.05)
Globulin: 2 g/dL (calc) (ref 1.9–3.7)
Glucose, Bld: 86 mg/dL (ref 65–99)
Potassium: 4.1 mmol/L (ref 3.5–5.3)
Sodium: 137 mmol/L (ref 135–146)
Total Bilirubin: 0.7 mg/dL (ref 0.2–1.2)
Total Protein: 6.6 g/dL (ref 6.1–8.1)
eGFR: 85 mL/min/{1.73_m2} (ref >=60)

## 2020-12-28 LAB — CBC WITH DIFFERENTIAL/PLATELET
Absolute Monocytes: 363 cells/uL (ref 200–950)
Basophils Absolute: 51 cells/uL (ref 0–200)
Basophils Relative: 1.1 %
Eosinophils Absolute: 83 cells/uL (ref 15–500)
Eosinophils Relative: 1.8 %
HCT: 40.7 % (ref 35.0–45.0)
Hemoglobin: 13.6 g/dL (ref 11.7–15.5)
Lymphs Abs: 1909 cells/uL (ref 850–3900)
MCH: 32.3 pg (ref 27.0–33.0)
MCHC: 33.4 g/dL (ref 32.0–36.0)
MCV: 96.7 fL (ref 80.0–100.0)
MPV: 10.8 fL (ref 7.5–12.5)
Monocytes Relative: 7.9 %
Neutro Abs: 2194 cells/uL (ref 1500–7800)
Neutrophils Relative %: 47.7 %
Platelets: 246 10*3/uL (ref 140–400)
RBC: 4.21 10*6/uL (ref 3.80–5.10)
RDW: 12.4 % (ref 11.0–15.0)
Total Lymphocyte: 41.5 %
WBC: 4.6 10*3/uL (ref 3.8–10.8)

## 2020-12-28 LAB — LIPID PANEL
Cholesterol: 187 mg/dL (ref <200)
HDL: 92 mg/dL (ref >=50)
LDL Cholesterol (Calc): 81 mg/dL (calc)
Non-HDL Cholesterol (Calc): 95 mg/dL (calc) (ref <130)
Total CHOL/HDL Ratio: 2 (calc) (ref <5.0)
Triglycerides: 60 mg/dL (ref <150)

## 2020-12-28 LAB — TSH: TSH: 1.34 mIU/L (ref 0.40–4.50)

## 2020-12-30 ENCOUNTER — Other Ambulatory Visit: Payer: Self-pay | Admitting: Adult Health

## 2020-12-30 MED ORDER — FLUCONAZOLE 150 MG PO TABS: 150.0000 mg | ORAL_TABLET | Freq: Once | ORAL | 1 refills | Status: AC

## 2021-01-08 ENCOUNTER — Encounter: Payer: Self-pay | Admitting: Internal Medicine

## 2021-02-14 ENCOUNTER — Other Ambulatory Visit: Payer: Self-pay | Admitting: Nurse Practitioner

## 2021-02-14 DIAGNOSIS — R0989 Other specified symptoms and signs involving the circulatory and respiratory systems: Secondary | ICD-10-CM

## 2021-02-14 MED ORDER — TRIAMTERENE-HCTZ 75-50 MG PO TABS: ORAL_TABLET | ORAL | 3 refills | Status: DC

## 2021-03-31 ENCOUNTER — Encounter: Payer: Medicare Other | Admitting: Adult Health

## 2021-04-01 ENCOUNTER — Encounter: Payer: Medicare Other | Admitting: Nurse Practitioner

## 2021-05-23 NOTE — Progress Notes (Signed)
CPE  Assessment:   Encounter for Annual Physical Exam with abnormal findings Due annually  Health Maintenance reviewed Healthy lifestyle reviewed and goals set  Labile hypertension Well controlled off of meds at this time Monitor blood pressure at home; call if consistently over 130/80 Continue DASH diet, lifestyle discussed  Reminder to go to the ER if any CP, SOB, nausea, dizziness, severe HA, changes vision/speech, left arm numbness and tingling and jaw pain.  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 Toy Care is managing, hx of trauma Continue medications; declines any changes  Lifestyle discussed: diet/exerise, sleep hygiene, stress management, hydration No SI/HI   IBS (irritable bowel syndrome)-constipation type Continue linzess 290 mg daily; PRN senokot and stool softener Increase water intake Discussed lifestyle;  May benefit from short trial of probiotic; "GI stability - standard process" and "Align" suggested, 1-3 months, d/c if no benefit and can repeat PRN.   Asthma, chronic, unspecified asthma severity, uncomplicated Continue medications; recently improved/stable avoid triggers   Migraines Recently improved; has seen neuro; rare eletriptan  Raynaud's  Didn't tolerate nifedipine, labile low BPs She reports doing well with lifestyle changes  Lower back pain Benign exam, suspect muscular etiology, possible some SI Get lumbar/pelvic xray, if negative plan PT referral  Start meloxicam daily x 2 weeks, then  PRN Declined steroid taper at this time    Orders Placed This Encounter  Procedures   DG Lumbar Spine Complete   Flu vaccine HIGH DOSE PF   CBC with Differential/Platelet   COMPLETE METABOLIC PANEL WITH GFR   Magnesium   Lipid panel   TSH   Hemoglobin A1c   VITAMIN D 25 Hydroxy (Vit-D Deficiency, Fractures)   Microalbumin / creatinine urine ratio   Urinalysis, Routine w reflex microscopic   EKG 12-Lead     Over 30 minutes of exam, counseling, chart review and critical decision making was performed Future Appointments  Date Time Provider [place]  11/26/2021 10:30 AM Unk Pinto, MD [facility] None  05/27/2022  3:00 PM Liane Comber, NP [facility] None     Plan:   During the course of the visit the patient was educated and counseled about appropriate screening and preventive services including:   Pneumococcal vaccine  Prevnar 13 Influenza vaccine Td vaccine Screening electrocardiogram Bone densitometry screening Colorectal cancer screening Diabetes screening Glaucoma screening Nutrition counseling  Advanced directives: requested   Subjective:  Alexis Weaver is a 70 y.o. female who presents for CPE and follow up. She has Palpitations; Asthma, chronic; Chronic sinusitis; Chronic anxiety; Major depression in partial remission ([place]); Osteopenia; Abnormal glucose; Medication management; IBS (irritable bowel syndrome)-constipation type; Vitamin D deficiency; Labile hypertension; Migraines; Raynaud's phenomenon without gangrene; Raynaud disease; and Hyperlipidemia, mixed on their problem list.   She is married, 2 children, 1 grandson; retired Marine scientist.   She is concerned about Left lower back/hip tenderness, pain that moves around, typically in muscles, ongoing and seems progressive since July 2022, no injury prior to onset. Hasn't tried anything. Denies weakness or radicular type pain, changes in sensation. Has meloxicam but hasn't tried taking regularly.  She has tender pressure points, had negative ANA, AntiDNA, RF.  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   She reports long history of migraines; has seen neuro; much improved since retiring; she does have abortive relpax which works well, needs more with weather changes.   She was seeing a therapist and Dr. Evelene Croon for chronic depression following trauma in childhood, son with TBI/addiction, taking trazodone for sleep, has been taking adderall XR 10 mg daily. "I'm doing as well as can be expected."  Has seen Dr. Ezzard Standing and Meraux Callas for allergies/asthma but shots didn't help. She is on allegra, and prednisone intermittently.   IBS-C, on linzess 290 mg with sennokot.  She had normal colonoscopy by Dr. Russella Dar in 2017.   She has reported episodic red/blue/white discoloration and coolness of extremities and underwent LE vascular US which was unremarkable; felt to be Raynaud's, was prescribed procardia but was unable to tolerate low BP, prefers managing with lifestyle.  BMI is Body mass index is 20.02 kg/m., she has been working on diet and exercise, has worked to reduce refined carbohydrates/sugars, exercises 60+ min at least 5 days a week.  Wt Readings from Last 3 Encounters:  05/27/21 100 lb 12.8 oz (45.7 kg)  12/27/20 104 lb (47.2 kg)  10/07/20 110 lb 12.8 oz (50.3 kg)    Her blood pressure has been controlled at home (labile, ranges 110/60-rare low 140s/80s), today their BP is BP: 138/68 She does workout. She denies chest pain, shortness of breath, dizziness.    She is not on cholesterol medication and denies myalgias. Her cholesterol is at goal. The cholesterol last visit was:   Lab Results  Component Value Date   CHOL 187 12/27/2020   HDL 92 12/27/2020   LDLCALC 81 12/27/2020   TRIG 60 12/27/2020   CHOLHDL 2.0 12/27/2020   She has hx of prediabetes (A1C 6.0 in 2016, 5.8 in 2020). She has been working on lifestyle for glucose management. Last A1C:  Lab  Results  Component Value Date   HGBA1C 5.6 10/07/2020   Lab Results  Component Value Date   GFRNONAA 64 10/07/2020   Patient is on Vitamin D supplement, did reduce from 10000 IU to 5000 IU   Lab Results  Component Value Date   VD25OH 101 (H) 10/07/2020         Medication Review: Current Outpatient Medications on File Prior to Visit  Medication Sig Dispense Refill   amphetamine-dextroamphetamine (ADDERALL XR) 10 MG 24 hr capsule Take 1 capsule Daily for ADD  per Dr Evelene Croon  0   bimatoprost (LATISSE) 0.03 % ophthalmic solution PLACE 1 DROP ON APPLICATOR AND APPLY EVENLY ALONG SKIN OF UPPER EYELIDS AT BASE OF EYELASHES ONCE DAILY AT BEDTIME 15 mL 3   Cholecalciferol (VITAMIN D3) 125 MCG (5000 UT) CAPS Take 1 cap 4-5 days per week     cycloSPORINE (RESTASIS) 0.05 % ophthalmic emulsion Place 1 drop into both eyes 2 (two) times daily.     Efinaconazole (JUBLIA) 10 % SOLN Apply topically daily.     eletriptan (RELPAX) 40 MG tablet Take 1 tablet for Migraine & may repeat 1 x in 2 hours (Maximum 2 tabs /24 hours) 6 tablet 5   ibuprofen (ADVIL) 800 MG tablet Take 1 tablet (800 mg total) by mouth every 8 (eight) hours as needed for moderate pain (take for 2 weeks with food, can take with tylenol). 90 tablet 0   linaclotide (LINZESS) 290 MCG CAPS  capsule TAKE 1 CAPSULE BY MOUTH DAILY FOR IRRITABLE BOWEL SYNDROME 90 capsule 3   Milnacipran HCl (SAVELLA) 100 MG TABS tablet TAKE 2 TABLETS BY MOUTH DAILY FOR MOOD 180 tablet 3   ondansetron (ZOFRAN) 8 MG tablet Take1 tablet 2 to 3 x /day as needed for Nausea / Emesis 100 tablet 0   OVER THE COUNTER MEDICATION Apple Cider Vinegar-Take daily     OVER THE COUNTER MEDICATION 500 mg. Vitamin C daily.     OVER THE COUNTER MEDICATION Cinnamon daily     OVER THE COUNTER MEDICATION Zinc 50 mg daily     predniSONE (DELTASONE) 20 MG tablet 2 tablets daily for 3 days, 1 tablet daily for 4 days. (Patient taking differently: as needed. 2 tablets daily for 3 days, 1  tablet daily for 4 days.) 10 tablet 0   Sennosides (SENOKOT PO) Take 1 tablet by mouth as needed.      traZODone (DESYREL) 50 MG tablet Take 1 tablet (50 mg total) by mouth at bedtime as needed for sleep. 90 tablet 1   triamterene-hydrochlorothiazide (MAXZIDE) 75-50 MG tablet TAKE 1 TABLET BY MOUTH DAILY FOR BLOOD PRESSURE AND FLUID RETENTION 90 tablet 3   No current facility-administered medications on file prior to visit.    Allergies  Allergen Reactions   Hydrocodone-Acetaminophen Shortness Of Breath   Lamisil [Terbinafine] Rash   Codeine Hives   Morphine And Related Hives   Nitrofurantoin Other (See Comments)    Makes hands and feet burn   Tetracycline     Current Problems (verified) Patient Active Problem List   Diagnosis Date Noted   Hyperlipidemia, mixed 10/06/2020   Migraines 03/22/2020   Raynaud's phenomenon without gangrene 03/22/2020   Raynaud disease 03/22/2020   Labile hypertension 07/07/2017   Abnormal glucose 06/26/2014   Medication management 06/26/2014   IBS (irritable bowel syndrome)-constipation type 06/26/2014   Vitamin D deficiency 06/26/2014   Asthma, chronic    Chronic sinusitis    Chronic anxiety    Major depression in partial remission ([place])    Osteopenia    Palpitations 04/01/2010    Screening Tests Immunization History  Administered Date(s) Administered   DT (Pediatric) 11/06/2004   Influenza, High Dose Seasonal PF 03/23/2019, 03/22/2020, 05/27/2021   Influenza-Unspecified 02/14/2012   PFIZER(Purple Top)SARS-COV-2 Vaccination 10/12/2019, 11/06/2019, 06/01/2020   Pneumococcal Conjugate-13 10/26/2017   Pneumococcal Polysaccharide-23 03/23/2019   Tdap 02/26/2011    Tetanus: 2012 - boost if needed per patient   Pneumovax: 2020 Prevnar 13: 10/2017 Flu vaccine: TODAY Shingrix: check with insurance  Covid 19: 2/2, 2021, pfizer + booster  Pap: 2016, declines another, never abnormal pap  MGM: 12/25/2020 [employer]  DEXA: 12/2020 R fem T -2.2  (mild improvement)  Colonoscopy: 2017  Dr. Fuller Plan 10 years EGD: 10/2015  Names of Other Physician/Practitioners you currently use: 1. [facility] here for primary care 2. Dr. Delman Cheadle, eye doctor, last visit 05/2021 3. Dr. Melony Overly, dentist, last visit 05/2021, goes q96m  4. Dr. Allyson Sabal, derm, last 2021, has scheduled 07/2021  Patient Care Team: Unk Pinto, MD as PCP - General (Internal Medicine) Mosetta Anis, MD as Referring Physician (Allergy) Melrose Nakayama, MD as Consulting Physician (Orthopedic Surgery)  SURGICAL HISTORY She  has a past surgical history that includes Abdominal hysterectomy; Cesarean section (1979, 1983); Breast enhancement surgery; Laparoscopic abdominal exploration; Hemorrhoid banding; and Appendectomy (1997). FAMILY HISTORY Her family history includes Asthma in her brother; COPD in her brother; Diabetes in her brother and father; Diverticulitis  in her maternal grandmother; Hyperlipidemia in her brother; Hypertension in her father and mother; Rheum arthritis in her sister. SOCIAL HISTORY She  reports that she has never smoked. She has never used smokeless tobacco. She reports that she does not drink alcohol and does not use drugs.   Review of Systems  Constitutional:  Negative for chills, diaphoresis, fever, malaise/fatigue and weight loss.  HENT:  Negative for congestion, ear discharge, ear pain, hearing loss, nosebleeds, sore throat and tinnitus.   Eyes: Negative.  Negative for blurred vision and double vision.  Respiratory:  Negative for cough, hemoptysis, sputum production, shortness of breath, wheezing and stridor.   Cardiovascular: Negative.  Negative for chest pain, palpitations, orthopnea, claudication, leg swelling and PND.  Gastrointestinal:  Positive for constipation. Negative for abdominal pain, blood in stool, diarrhea, heartburn, melena, nausea and vomiting.  Genitourinary: Negative.  Negative for dysuria, flank  pain, frequency, hematuria and urgency.  Musculoskeletal:  Negative for back pain, falls, joint pain, myalgias and neck pain.  Skin: Negative.  Negative for rash.  Neurological: Negative.  Negative for dizziness, tingling, sensory change, weakness and headaches.  Endo/Heme/Allergies:  Negative for polydipsia.  Psychiatric/Behavioral:  Positive for depression. Negative for hallucinations, memory loss, substance abuse and suicidal ideas. The patient is not nervous/anxious and does not have insomnia.   All other systems reviewed and are negative.   Objective:     Today's Vitals   05/27/21 1518  BP: 138/68  Pulse: 95  Temp: (!) 97.5 F (36.4 C)  SpO2: 99%  Weight: 100 lb 12.8 oz (45.7 kg)  Height: 4' 11.5" (1.511 m)   Body mass index is 20.02 kg/m.  General appearance: alert, no distress, WD/WN, female HEENT: normocephalic, sclerae anicteric, TMs pearly, nares patent, no discharge or erythema, pharynx normal Oral cavity: MMM, no lesions Neck: supple, no lymphadenopathy, no thyromegaly, no masses Heart: RRR, normal S1, S2, no murmurs Lungs: CTA bilaterally, no wheezes, rhonchi, or rales Breasts: Recent normal mammogram, doing self exams, declines today  Abdomen: +bs, soft, non tender, non distended, no masses, no hepatomegaly, no splenomegaly Musculoskeletal: nontender, no swelling, no obvious deformity. Extremities: no edema, no cyanosis, no clubbing Pulses: 2+ symmetric, upper and lower extremities, normal cap refill Neurological: alert, oriented x 3, CN2-12 intact, strength normal upper extremities and lower extremities, sensation normal throughout, DTRs 2+ throughout, no cerebellar signs, gait normal Psychiatric: normal affect, behavior normal, pleasant  GU: defer  EKG: sinus arrhythmia; No st changes; WNL  Izora Ribas, NP   05/27/2021

## 2021-05-27 ENCOUNTER — Ambulatory Visit (INDEPENDENT_AMBULATORY_CARE_PROVIDER_SITE_OTHER): Payer: Medicare Other | Admitting: Adult Health

## 2021-05-27 ENCOUNTER — Other Ambulatory Visit: Payer: Self-pay

## 2021-05-27 ENCOUNTER — Encounter: Payer: Self-pay | Admitting: Adult Health

## 2021-05-27 VITALS — BP 138/68 | HR 95 | Temp 97.5°F | Ht 59.5 in | Wt 100.8 lb

## 2021-05-27 DIAGNOSIS — Z23 Encounter for immunization: Secondary | ICD-10-CM

## 2021-05-27 DIAGNOSIS — R002 Palpitations: Secondary | ICD-10-CM

## 2021-05-27 DIAGNOSIS — M8580 Other specified disorders of bone density and structure, unspecified site: Secondary | ICD-10-CM

## 2021-05-27 DIAGNOSIS — K581 Irritable bowel syndrome with constipation: Secondary | ICD-10-CM

## 2021-05-27 DIAGNOSIS — R0989 Other specified symptoms and signs involving the circulatory and respiratory systems: Secondary | ICD-10-CM | POA: Diagnosis not present

## 2021-05-27 DIAGNOSIS — R7309 Other abnormal glucose: Secondary | ICD-10-CM

## 2021-05-27 DIAGNOSIS — Z1329 Encounter for screening for other suspected endocrine disorder: Secondary | ICD-10-CM

## 2021-05-27 DIAGNOSIS — J45909 Unspecified asthma, uncomplicated: Secondary | ICD-10-CM

## 2021-05-27 DIAGNOSIS — Z0001 Encounter for general adult medical examination with abnormal findings: Secondary | ICD-10-CM

## 2021-05-27 DIAGNOSIS — Z136 Encounter for screening for cardiovascular disorders: Secondary | ICD-10-CM | POA: Diagnosis not present

## 2021-05-27 DIAGNOSIS — Z79899 Other long term (current) drug therapy: Secondary | ICD-10-CM

## 2021-05-27 DIAGNOSIS — F3341 Major depressive disorder, recurrent, in partial remission: Secondary | ICD-10-CM

## 2021-05-27 DIAGNOSIS — G43909 Migraine, unspecified, not intractable, without status migrainosus: Secondary | ICD-10-CM

## 2021-05-27 DIAGNOSIS — Z0000 Encounter for general adult medical examination without abnormal findings: Secondary | ICD-10-CM | POA: Diagnosis not present

## 2021-05-27 DIAGNOSIS — E782 Mixed hyperlipidemia: Secondary | ICD-10-CM

## 2021-05-27 DIAGNOSIS — Z1389 Encounter for screening for other disorder: Secondary | ICD-10-CM

## 2021-05-27 DIAGNOSIS — M545 Low back pain, unspecified: Secondary | ICD-10-CM

## 2021-05-27 DIAGNOSIS — Z131 Encounter for screening for diabetes mellitus: Secondary | ICD-10-CM

## 2021-05-27 DIAGNOSIS — E559 Vitamin D deficiency, unspecified: Secondary | ICD-10-CM

## 2021-05-27 DIAGNOSIS — F419 Anxiety disorder, unspecified: Secondary | ICD-10-CM

## 2021-05-28 ENCOUNTER — Encounter: Payer: Self-pay | Admitting: Adult Health

## 2021-05-28 LAB — CBC WITH DIFFERENTIAL/PLATELET
Absolute Monocytes: 331 cells/uL (ref 200–950)
Basophils Absolute: 39 cells/uL (ref 0–200)
Basophils Relative: 0.9 %
Eosinophils Absolute: 30 cells/uL (ref 15–500)
Eosinophils Relative: 0.7 %
HCT: 43.6 % (ref 35.0–45.0)
Hemoglobin: 14.5 g/dL (ref 11.7–15.5)
Lymphs Abs: 1969 cells/uL (ref 850–3900)
MCH: 31.7 pg (ref 27.0–33.0)
MCHC: 33.3 g/dL (ref 32.0–36.0)
MCV: 95.4 fL (ref 80.0–100.0)
MPV: 10.9 fL (ref 7.5–12.5)
Monocytes Relative: 7.7 %
Neutro Abs: 1931 cells/uL (ref 1500–7800)
Neutrophils Relative %: 44.9 %
Platelets: 247 10*3/uL (ref 140–400)
RBC: 4.57 10*6/uL (ref 3.80–5.10)
RDW: 11.8 % (ref 11.0–15.0)
Total Lymphocyte: 45.8 %
WBC: 4.3 10*3/uL (ref 3.8–10.8)

## 2021-05-28 LAB — URINALYSIS, ROUTINE W REFLEX MICROSCOPIC
Bilirubin Urine: NEGATIVE
Glucose, UA: NEGATIVE
Hgb urine dipstick: NEGATIVE
Ketones, ur: NEGATIVE
Leukocytes,Ua: NEGATIVE
Nitrite: NEGATIVE
Protein, ur: NEGATIVE
Specific Gravity, Urine: 1.014 (ref 1.001–1.035)
pH: 7.5 (ref 5.0–8.0)

## 2021-05-28 LAB — LIPID PANEL
Cholesterol: 181 mg/dL (ref <200)
HDL: 98 mg/dL (ref >=50)
LDL Cholesterol (Calc): 70 mg/dL (calc)
Non-HDL Cholesterol (Calc): 83 mg/dL (calc) (ref <130)
Total CHOL/HDL Ratio: 1.8 (calc) (ref <5.0)
Triglycerides: 51 mg/dL (ref <150)

## 2021-05-28 LAB — HEMOGLOBIN A1C
Hgb A1c MFr Bld: 5.5 % of total Hgb (ref <5.7)
Mean Plasma Glucose: 111 mg/dL
eAG (mmol/L): 6.2 mmol/L

## 2021-05-28 LAB — COMPLETE METABOLIC PANEL WITH GFR
AG Ratio: 2.1 (calc) (ref 1.0–2.5)
ALT: 21 U/L (ref 6–29)
AST: 25 U/L (ref 10–35)
Albumin: 4.5 g/dL (ref 3.6–5.1)
Alkaline phosphatase (APISO): 59 U/L (ref 37–153)
BUN: 14 mg/dL (ref 7–25)
CO2: 33 mmol/L — ABNORMAL HIGH (ref 20–32)
Calcium: 10.1 mg/dL (ref 8.6–10.4)
Chloride: 95 mmol/L — ABNORMAL LOW (ref 98–110)
Creat: 0.77 mg/dL (ref 0.50–1.05)
Globulin: 2.1 g/dL (calc) (ref 1.9–3.7)
Glucose, Bld: 102 mg/dL — ABNORMAL HIGH (ref 65–99)
Potassium: 3.4 mmol/L — ABNORMAL LOW (ref 3.5–5.3)
Sodium: 137 mmol/L (ref 135–146)
Total Bilirubin: 0.8 mg/dL (ref 0.2–1.2)
Total Protein: 6.6 g/dL (ref 6.1–8.1)
eGFR: 83 mL/min/{1.73_m2} (ref >=60)

## 2021-05-28 LAB — MICROALBUMIN / CREATININE URINE RATIO
Creatinine, Urine: 71 mg/dL (ref 20–275)
Microalb Creat Ratio: 6 mcg/mg creat (ref <30)
Microalb, Ur: 0.4 mg/dL

## 2021-05-28 LAB — TSH: TSH: 1.34 mIU/L (ref 0.40–4.50)

## 2021-05-28 LAB — VITAMIN D 25 HYDROXY (VIT D DEFICIENCY, FRACTURES): Vit D, 25-Hydroxy: 101 ng/mL — ABNORMAL HIGH (ref 30–100)

## 2021-05-28 LAB — MAGNESIUM: Magnesium: 1.8 mg/dL (ref 1.5–2.5)

## 2021-05-30 ENCOUNTER — Other Ambulatory Visit: Payer: Self-pay

## 2021-05-30 ENCOUNTER — Ambulatory Visit
Admission: RE | Admit: 2021-05-30 | Discharge: 2021-05-30 | Disposition: A | Discharge status: Home / Self Care | Payer: Medicare Other | Source: Ambulatory Visit | Attending: Adult Health | Admitting: Adult Health

## 2021-05-30 DIAGNOSIS — M545 Low back pain, unspecified: Secondary | ICD-10-CM

## 2021-06-03 ENCOUNTER — Encounter: Payer: Self-pay | Admitting: Adult Health

## 2021-06-03 ENCOUNTER — Other Ambulatory Visit: Payer: Self-pay | Admitting: Adult Health

## 2021-06-03 DIAGNOSIS — M4316 Spondylolisthesis, lumbar region: Secondary | ICD-10-CM

## 2021-06-03 DIAGNOSIS — M545 Low back pain, unspecified: Secondary | ICD-10-CM

## 2021-06-13 ENCOUNTER — Other Ambulatory Visit: Payer: Self-pay | Admitting: Internal Medicine

## 2021-08-10 ENCOUNTER — Encounter: Payer: Self-pay | Admitting: Adult Health

## 2021-08-12 ENCOUNTER — Other Ambulatory Visit: Payer: Self-pay | Admitting: Adult Health

## 2021-08-12 MED ORDER — ONDANSETRON HCL 8 MG PO TABS: ORAL_TABLET | ORAL | 0 refills | Status: DC

## 2021-08-12 MED ORDER — ELETRIPTAN HYDROBROMIDE 40 MG PO TABS: ORAL_TABLET | ORAL | 5 refills | Status: DC

## 2021-08-12 MED ORDER — PREDNISONE 20 MG PO TABS: ORAL_TABLET | ORAL | 0 refills | Status: DC

## 2021-11-26 ENCOUNTER — Ambulatory Visit: Payer: Medicare Other | Admitting: Internal Medicine

## 2021-11-27 ENCOUNTER — Other Ambulatory Visit: Payer: Self-pay | Admitting: Internal Medicine

## 2021-11-27 MED ORDER — CIPROFLOXACIN HCL 250 MG PO TABS: ORAL_TABLET | ORAL | 0 refills | Status: DC

## 2021-12-10 NOTE — Patient Instructions (Signed)

## 2021-12-10 NOTE — Progress Notes (Signed)
e   Future Appointments  Date Time Provider Department  12/11/2021 11:30 AM [name], MD [facility]  05/27/2022  3:00 PM  [facility]    History of Present Illness:       This very nice 71 y.o. MWF presents for 6 month follow up with HTN, HLD, Pre-Diabetes, hx/o Migraine, hx/o Raynaud's  and Vitamin D Deficiency.        Patient has hx/o mild labile HTN  treated w/Maxzide  & BP has been controlled at home. Today's BP is at goal - 138/78. Patient has had no complaints of any cardiac type chest pain, palpitations, dyspnea / orthopnea / PND, dizziness, claudication or dependent edema.       Hyperlipidemia is controlled with diet. Last Lipids were at goal :  Lab Results  Component Value Date   CHOL 181 05/27/2021   HDL 98 05/27/2021   LDLCALC 70 05/27/2021   TRIG 51 05/27/2021   CHOLHDL 1.8 05/27/2021     Also, the patient has history of PreDiabetes (A1c 5.9% /2015 and 6.0% /2016) and has had no symptoms of reactive hypoglycemia, diabetic polys, paresthesias or visual blurring.  Last A1c was normal & at goal :  Lab Results  Component Value Date   HGBA1C 5.5 05/27/2021                                                           Further, the patient also has history of Vitamin D Deficiency, PreDiabetes  (A1c 5.9% /2015 and 6.0% /2016)  and supplements vitamin D .   Last vitamin D was at goal :   Lab Results  Component Value Date   VD25OH 101 (H) 05/27/2021     Current Outpatient Medications on File Prior to Visit  Medication Sig   amphetamine-dextroamphetamine (ADDERALL XR) 10 MG 24 hr capsule Take 1 capsule Daily for ADD  per Dr Evelene Croon   bimatoprost (LATISSE) 0.03 % ophthalmic solution PLACE 1 DROP ON APPLICATOR AND APPLY EVENLY ALONG SKIN OF UPPER EYELIDS AT BASE OF EYELASHES ONCE DAILY AT BEDTIME   Cholecalciferol (VITAMIN D3) 125 MCG (5000 UT) CAPS Take 1 cap 4-5 days per week   cycloSPORINE (RESTASIS) 0.05 % ophthalmic emulsion Place 1 drop into both eyes 2 (two)  times daily.   Efinaconazole (JUBLIA) 10 % SOLN Apply topically daily.   eletriptan (RELPAX) 40 MG tablet Take 1 tablet for Migraine & may repeat 1 x in 2 hours (Maximum 2 tabs /24 hours)   ibuprofen (ADVIL) 800 MG tablet Take 1 tablet (800 mg total) by mouth every 8 (eight) hours as needed for moderate pain (take for 2 weeks with food, can take with tylenol).   linaclotide (LINZESS) 290 MCG CAPS capsule TAKE 1 CAPSULE BY MOUTH DAILY FOR IRRITABLE BOWEL SYNDROME   Milnacipran HCl (SAVELLA) 100 MG TABS tablet TAKE 2 TABLETS BY MOUTH DAILY FOR MOOD   ondansetron (ZOFRAN) 8 MG tablet Take1 tablet 2 to 3 x /day as needed for Nausea / Emesis   OVER THE COUNTER MEDICATION Apple Cider Vinegar-Take daily   OVER THE COUNTER MEDICATION 500 mg. Vitamin C daily.   OVER THE COUNTER MEDICATION Cinnamon daily   OVER THE COUNTER MEDICATION Zinc 50 mg daily   Sennosides (SENOKOT PO) Take 1 tablet by mouth as needed.  traZODone (DESYREL) 50 MG tablet Take 1 tablet (50 mg total) by mouth at bedtime as needed for sleep.   triamterene-hydrochlorothiazide (MAXZIDE) 75-50 MG tablet TAKE 1 TABLET BY MOUTH DAILY FOR BLOOD PRESSURE AND FLUID RETENTION   No current facility-administered medications on file prior to visit.     Allergies  Allergen Reactions   Hydrocodone-Acetaminophen Shortness Of Breath   Lamisil [Terbinafine] Rash   Codeine Hives   Morphine And Related Hives   Nitrofurantoin Other (See Comments)    Makes hands and feet burn   Tetracycline      PMHx:   Past Medical History:  Diagnosis Date   Allergy    Anemia 2016   Anxiety    Asthma    Chronic headaches    Chronic sinusitis    Depression    Fibromyalgia    GERD (gastroesophageal reflux disease)    IBS (irritable bowel syndrome)    Meckel diverticulum 1997   Meckel's diverticulum    Migraines 03/22/2020   Neuromuscular disorder (HCC)    fibromyagia   Osteopenia      Immunization History  Administered Date(s) Administered    DT (Pediatric) 11/06/2004   Influenza, High Dose Seasonal PF 03/23/2019, 03/22/2020, 05/27/2021   Influenza-Unspecified 02/14/2012   PFIZER(Purple Top)SARS-COV-2 Vaccination 10/12/2019, 11/06/2019, 06/01/2020   Pneumococcal Conjugate-13 10/26/2017   Pneumococcal Polysaccharide-23 03/23/2019   Tdap 02/26/2011     Past Surgical History:  Procedure Laterality Date   ABDOMINAL HYSTERECTOMY     tubes/ovaries spared   APPENDECTOMY  1997   BREAST ENHANCEMENT SURGERY     CESAREAN SECTION  1979, 1983   x 2   HEMORRHOID BANDING     LAPAROSCOPIC ABDOMINAL EXPLORATION     peritonitis, appendectomy    FHx:    Reviewed / unchanged  SHx:    Reviewed / unchanged   Systems Review:  Constitutional: Denies fever, chills, wt changes, headaches, insomnia, fatigue, night sweats, change in appetite. Eyes: Denies redness, blurred vision, diplopia, discharge, itchy, watery eyes.  ENT: Denies discharge, congestion, post nasal drip, epistaxis, sore throat, earache, hearing loss, dental pain, tinnitus, vertigo, sinus pain, snoring.  CV: Denies chest pain, palpitations, irregular heartbeat, syncope, dyspnea, diaphoresis, orthopnea, PND, claudication or edema. Respiratory: denies cough, dyspnea, DOE, pleurisy, hoarseness, laryngitis, wheezing.  Gastrointestinal: Denies dysphagia, odynophagia, heartburn, reflux, water brash, abdominal pain or cramps, nausea, vomiting, bloating, diarrhea, constipation, hematemesis, melena, hematochezia  or hemorrhoids. Genitourinary: Denies dysuria, frequency, urgency, nocturia, hesitancy, discharge, hematuria or flank pain. Musculoskeletal: Denies arthralgias, myalgias, stiffness, jt. swelling, pain, limping or strain/sprain.  Skin: Denies pruritus, rash, hives, warts, acne, eczema or change in skin lesion(s). Neuro: No weakness, tremor, incoordination, spasms, paresthesia or pain. Psychiatric: Denies confusion, memory loss or sensory loss. Endo: Denies change in weight,  skin or hair change.  Heme/Lymph: No excessive bleeding, bruising or enlarged lymph nodes.  Physical Exam  BP 138/78   Pulse 76   Temp (!) 96.9 F (36.1 C)   Wt 101 lb 6.4 oz (46 kg)   SpO2 99%   BMI 20.14 kg/m   Appears  well nourished, well groomed  and in no distress.  Eyes: PERRLA, EOMs, conjunctiva no swelling or erythema. Sinuses: No frontal/maxillary tenderness ENT/Mouth: EAC's clear, TM's nl w/o erythema, bulging. Nares clear w/o erythema, swelling, exudates. Oropharynx clear without erythema or exudates. Oral hygiene is good. Tongue normal, non obstructing. Hearing intact.  Neck: Supple. Thyroid not palpable. Car 2+/2+ without bruits, nodes or JVD. Chest: Respirations nl with BS clear &  equal w/o rales, rhonchi, wheezing or stridor.  Cor: Heart sounds normal w/ regular rate and rhythm without sig. murmurs, gallops, clicks or rubs. Peripheral pulses normal and equal  without edema.  Abdomen: Soft & bowel sounds normal. Non-tender w/o guarding, rebound, hernias, masses or organomegaly.  Lymphatics: Unremarkable.  Musculoskeletal: Full ROM all peripheral extremities, joint stability, 5/5 strength and normal gait.  Skin: Warm, dry without exposed rashes, lesions or ecchymosis apparent.  Neuro: Cranial nerves intact, reflexes equal bilaterally. Sensory-motor testing grossly intact. Tendon reflexes grossly intact.  Pysch: Alert & oriented x 3.  Insight and judgement nl & appropriate. No ideations.  Assessment and Plan:  1. Labile hypertension  - Continue medication, monitor blood pressure at home.  - Continue DASH diet.  Reminder to go to the ER if any CP,  SOB, nausea, dizziness, severe HA, changes vision/speech.   - CBC with Differential/Platelet - COMPLETE METABOLIC PANEL WITH GFR - Magnesium - TSH  2. Hyperlipidemia, mixed  - Continue diet/meds, exercise,& lifestyle modifications.  - Continue monitor periodic cholesterol/liver & renal functions    - Lipid  panel - TSH  3. Abnormal glucose  - Continue diet, exercise  - Lifestyle modifications.  - Monitor appropriate labs.   - Hemoglobin A1c - Insulin, random  4. Vitamin D deficiency  - Continue supplementation    - VITAMIN D 25 Hydroxy   5. Migraine   6. Medication management  - CBC with Differential/Platelet - COMPLETE METABOLIC PANEL WITH GFR - Magnesium - Lipid panel - TSH - Hemoglobin A1c - Insulin, random - VITAMIN D 25 Hydroxy        Discussed  regular exercise, BP monitoring, weight control to achieve/maintain BMI less than 25 and discussed med and SE's. Recommended labs to assess /monitor clinical status .  I discussed the assessment and treatment plan with the patient. The patient was provided an opportunity to ask questions and all were answered. The patient agreed with the plan and demonstrated an understanding of the instructions.  I provided over 30 minutes of exam, counseling, chart review and  complex critical decision making.        The patient was advised to call back or seek an in-person evaluation if the symptoms worsen or if the condition fails to improve as anticipated.   Marinus Maw, MD

## 2021-12-11 ENCOUNTER — Encounter: Payer: Self-pay | Admitting: Internal Medicine

## 2021-12-11 ENCOUNTER — Other Ambulatory Visit: Payer: Self-pay | Admitting: Internal Medicine

## 2021-12-11 ENCOUNTER — Ambulatory Visit (INDEPENDENT_AMBULATORY_CARE_PROVIDER_SITE_OTHER): Payer: Medicare Other | Admitting: Internal Medicine

## 2021-12-11 VITALS — BP 138/78 | HR 76 | Temp 96.9°F | Ht 61.5 in | Wt 101.4 lb

## 2021-12-11 DIAGNOSIS — Z79899 Other long term (current) drug therapy: Secondary | ICD-10-CM

## 2021-12-11 DIAGNOSIS — E782 Mixed hyperlipidemia: Secondary | ICD-10-CM | POA: Diagnosis not present

## 2021-12-11 DIAGNOSIS — R7309 Other abnormal glucose: Secondary | ICD-10-CM

## 2021-12-11 DIAGNOSIS — G43909 Migraine, unspecified, not intractable, without status migrainosus: Secondary | ICD-10-CM

## 2021-12-11 DIAGNOSIS — E559 Vitamin D deficiency, unspecified: Secondary | ICD-10-CM | POA: Diagnosis not present

## 2021-12-11 DIAGNOSIS — N951 Menopausal and female climacteric states: Secondary | ICD-10-CM

## 2021-12-11 DIAGNOSIS — J45909 Unspecified asthma, uncomplicated: Secondary | ICD-10-CM

## 2021-12-11 DIAGNOSIS — R0989 Other specified symptoms and signs involving the circulatory and respiratory systems: Secondary | ICD-10-CM

## 2021-12-11 MED ORDER — DEXAMETHASONE 1 MG PO TABS: ORAL_TABLET | ORAL | 0 refills | Status: DC

## 2021-12-11 MED ORDER — ESTRADIOL 0.025 MG/24HR TD PTTW: 1.0000 | MEDICATED_PATCH | TRANSDERMAL | 3 refills | Status: DC

## 2021-12-11 MED ORDER — MONTELUKAST SODIUM 10 MG PO TABS: ORAL_TABLET | ORAL | 3 refills | Status: DC

## 2021-12-11 MED ORDER — ESTRADIOL 0.025 MG/24HR TD PTWK: MEDICATED_PATCH | TRANSDERMAL | 3 refills | Status: DC

## 2021-12-12 LAB — COMPLETE METABOLIC PANEL WITH GFR
AG Ratio: 2 (calc) (ref 1.0–2.5)
ALT: 19 U/L (ref 6–29)
AST: 21 U/L (ref 10–35)
Albumin: 4.5 g/dL (ref 3.6–5.1)
Alkaline phosphatase (APISO): 64 U/L (ref 37–153)
BUN: 12 mg/dL (ref 7–25)
CO2: 34 mmol/L — ABNORMAL HIGH (ref 20–32)
Calcium: 9.6 mg/dL (ref 8.6–10.4)
Chloride: 97 mmol/L — ABNORMAL LOW (ref 98–110)
Creat: 0.85 mg/dL (ref 0.60–1.00)
Globulin: 2.2 g/dL (calc) (ref 1.9–3.7)
Glucose, Bld: 93 mg/dL (ref 65–99)
Potassium: 3.7 mmol/L (ref 3.5–5.3)
Sodium: 137 mmol/L (ref 135–146)
Total Bilirubin: 0.7 mg/dL (ref 0.2–1.2)
Total Protein: 6.7 g/dL (ref 6.1–8.1)
eGFR: 74 mL/min/{1.73_m2} (ref >=60)

## 2021-12-12 LAB — INSULIN, RANDOM: Insulin: 4.2 u[IU]/mL

## 2021-12-12 LAB — CBC WITH DIFFERENTIAL/PLATELET
Absolute Monocytes: 340 cells/uL (ref 200–950)
Basophils Absolute: 39 cells/uL (ref 0–200)
Basophils Relative: 0.9 %
Eosinophils Absolute: 69 cells/uL (ref 15–500)
Eosinophils Relative: 1.6 %
HCT: 41.3 % (ref 35.0–45.0)
Hemoglobin: 13.9 g/dL (ref 11.7–15.5)
Lymphs Abs: 1750 cells/uL (ref 850–3900)
MCH: 31.8 pg (ref 27.0–33.0)
MCHC: 33.7 g/dL (ref 32.0–36.0)
MCV: 94.5 fL (ref 80.0–100.0)
MPV: 10.6 fL (ref 7.5–12.5)
Monocytes Relative: 7.9 %
Neutro Abs: 2103 cells/uL (ref 1500–7800)
Neutrophils Relative %: 48.9 %
Platelets: 255 10*3/uL (ref 140–400)
RBC: 4.37 10*6/uL (ref 3.80–5.10)
RDW: 12.2 % (ref 11.0–15.0)
Total Lymphocyte: 40.7 %
WBC: 4.3 10*3/uL (ref 3.8–10.8)

## 2021-12-12 LAB — LIPID PANEL
Cholesterol: 192 mg/dL (ref <200)
HDL: 97 mg/dL (ref >=50)
LDL Cholesterol (Calc): 83 mg/dL (calc)
Non-HDL Cholesterol (Calc): 95 mg/dL (calc) (ref <130)
Total CHOL/HDL Ratio: 2 (calc) (ref <5.0)
Triglycerides: 50 mg/dL (ref <150)

## 2021-12-12 LAB — HEMOGLOBIN A1C
Hgb A1c MFr Bld: 5.5 % of total Hgb (ref <5.7)
Mean Plasma Glucose: 111 mg/dL
eAG (mmol/L): 6.2 mmol/L

## 2021-12-12 LAB — VITAMIN D 25 HYDROXY (VIT D DEFICIENCY, FRACTURES): Vit D, 25-Hydroxy: 95 ng/mL (ref 30–100)

## 2021-12-12 LAB — TSH: TSH: 2.02 mIU/L (ref 0.40–4.50)

## 2021-12-12 LAB — MAGNESIUM: Magnesium: 1.8 mg/dL (ref 1.5–2.5)

## 2021-12-12 NOTE — Progress Notes (Signed)
<><><><><><><><><><><><><><><><><><><><><><><><><><><><><><><><><> <><><><><><><><><><><><><><><><><><><><><><><><><><><><><><><><><> -   Test results slightly outside the reference range are not unusual. If there is anything important, I will review this with you,  otherwise it is considered normal test values.  If you have further questions,  please do not hesitate to contact me at the office or via My Chart.  <><><><><><><><><><><><><><><><><><><><><><><><><><><><><><><><><> <><><><><><><><><><><><><><><><><><><><><><><><><><><><><><><><><>  -   Magnesium  -  1.8  -  very  low- goal is betw 2.0 - 2.5,   - So..............Marland Kitchen  Recommend that you take  Magnesium 500 mg tablet x 2 tablets / daily   - also important to eat lots of  leafy green vegetables   - spinach - Kale - collards - greens - okra - asparagus  - broccoli - quinoa - squash - almonds   - black, red, white beans  -  peas - green beans <><><><><><><><><><><><><><><><><><><><><><><><><><><><><><><><><>  -  Total Chol = 192    &   LDL Chol = 83   - >  Excellent   - Very low risk for Heart Attack  / Stroke ============================================================ ============================================================  -  A1c - Normal  - No Diabetes  -   Great ! <><><><><><><><><><><><><><><><><><><><><><><><><><><><><><><><><>  -  Vitamin D = 95 - Excellent - Please keep dose    same <><><><><><><><><><><><><><><><><><><><><><><><><><><><><><><><><>  -  All Else - CBC - Kidneys - Electrolytes - Liver - Magnesium & Thyroid    - all  Normal / OK <><><><><><><><><><><><><><><><><><><><><><><><><><><><><><><><><>

## 2021-12-15 ENCOUNTER — Other Ambulatory Visit: Payer: Self-pay | Admitting: Internal Medicine

## 2021-12-15 ENCOUNTER — Encounter: Payer: Self-pay | Admitting: Internal Medicine

## 2021-12-15 NOTE — Progress Notes (Unsigned)
  Deb,  - It was sent in to [facility] on 6/29   Ask them what happened !

## 2021-12-28 ENCOUNTER — Other Ambulatory Visit: Payer: Self-pay | Admitting: Adult Health

## 2021-12-28 ENCOUNTER — Other Ambulatory Visit: Payer: Self-pay | Admitting: Internal Medicine

## 2021-12-28 MED ORDER — SAVELLA 100 MG PO TABS
ORAL_TABLET | ORAL | 3 refills | Status: DC
Start: 2021-12-28 — End: 2021-12-31

## 2021-12-29 ENCOUNTER — Encounter: Payer: Self-pay | Admitting: Internal Medicine

## 2021-12-31 ENCOUNTER — Other Ambulatory Visit: Payer: Self-pay | Admitting: Adult Health

## 2022-02-09 ENCOUNTER — Other Ambulatory Visit: Payer: Self-pay | Admitting: Internal Medicine

## 2022-02-09 DIAGNOSIS — K581 Irritable bowel syndrome with constipation: Secondary | ICD-10-CM

## 2022-02-09 MED ORDER — LINACLOTIDE 290 MCG PO CAPS: ORAL_CAPSULE | ORAL | 3 refills | Status: DC

## 2022-02-11 ENCOUNTER — Encounter: Payer: Self-pay | Admitting: Internal Medicine

## 2022-02-11 ENCOUNTER — Other Ambulatory Visit: Payer: Self-pay | Admitting: Nurse Practitioner

## 2022-02-11 DIAGNOSIS — K581 Irritable bowel syndrome with constipation: Secondary | ICD-10-CM

## 2022-02-11 MED ORDER — LINACLOTIDE 290 MCG PO CAPS: ORAL_CAPSULE | ORAL | 3 refills | Status: DC

## 2022-02-13 LAB — HM MAMMOGRAPHY

## 2022-02-19 ENCOUNTER — Encounter: Payer: Self-pay | Admitting: Internal Medicine

## 2022-03-11 ENCOUNTER — Ambulatory Visit: Payer: Medicare Other | Admitting: Internal Medicine

## 2022-03-16 ENCOUNTER — Encounter: Payer: Self-pay | Admitting: Nurse Practitioner

## 2022-03-16 ENCOUNTER — Ambulatory Visit
Admission: RE | Admit: 2022-03-16 | Discharge: 2022-03-16 | Disposition: A | Discharge status: Home / Self Care | Payer: Medicare Other | Source: Ambulatory Visit | Attending: Nurse Practitioner | Admitting: Nurse Practitioner

## 2022-03-16 ENCOUNTER — Ambulatory Visit (INDEPENDENT_AMBULATORY_CARE_PROVIDER_SITE_OTHER): Payer: Medicare Other | Admitting: Nurse Practitioner

## 2022-03-16 VITALS — BP 116/80 | HR 89 | Temp 97.7°F | Ht 59.5 in | Wt 103.8 lb

## 2022-03-16 DIAGNOSIS — M79601 Pain in right arm: Secondary | ICD-10-CM

## 2022-03-16 DIAGNOSIS — M8580 Other specified disorders of bone density and structure, unspecified site: Secondary | ICD-10-CM | POA: Diagnosis not present

## 2022-03-16 DIAGNOSIS — F3341 Major depressive disorder, recurrent, in partial remission: Secondary | ICD-10-CM

## 2022-03-16 DIAGNOSIS — R7309 Other abnormal glucose: Secondary | ICD-10-CM | POA: Diagnosis not present

## 2022-03-16 DIAGNOSIS — R0989 Other specified symptoms and signs involving the circulatory and respiratory systems: Secondary | ICD-10-CM | POA: Diagnosis not present

## 2022-03-16 DIAGNOSIS — M545 Low back pain, unspecified: Secondary | ICD-10-CM

## 2022-03-16 DIAGNOSIS — M25619 Stiffness of unspecified shoulder, not elsewhere classified: Secondary | ICD-10-CM

## 2022-03-16 DIAGNOSIS — J45909 Unspecified asthma, uncomplicated: Secondary | ICD-10-CM

## 2022-03-16 DIAGNOSIS — G43909 Migraine, unspecified, not intractable, without status migrainosus: Secondary | ICD-10-CM

## 2022-03-16 DIAGNOSIS — J320 Chronic maxillary sinusitis: Secondary | ICD-10-CM

## 2022-03-16 DIAGNOSIS — K581 Irritable bowel syndrome with constipation: Secondary | ICD-10-CM

## 2022-03-16 DIAGNOSIS — I7300 Raynaud's syndrome without gangrene: Secondary | ICD-10-CM

## 2022-03-16 DIAGNOSIS — Z79899 Other long term (current) drug therapy: Secondary | ICD-10-CM

## 2022-03-16 DIAGNOSIS — G8929 Other chronic pain: Secondary | ICD-10-CM

## 2022-03-16 DIAGNOSIS — E559 Vitamin D deficiency, unspecified: Secondary | ICD-10-CM

## 2022-03-16 MED ORDER — PREDNISONE 10 MG PO TABS: ORAL_TABLET | ORAL | 0 refills | Status: DC

## 2022-03-16 MED ORDER — MELOXICAM 15 MG PO TABS
ORAL_TABLET | ORAL | 0 refills | Status: AC
Start: 2022-03-16 — End: ?

## 2022-03-16 NOTE — Progress Notes (Signed)
Follow Up  Assessment:   Labile hypertension Discussed DASH (Dietary Approaches to Stop Hypertension) DASH diet is lower in sodium than a typical American diet. Cut back on foods that are high in saturated fat, cholesterol, and trans fats. Eat more whole-grain foods, fish, poultry, and nuts Remain active and exercise as tolerated daily.  Monitor BP at home-Call if greater than 130/80.  Check CMP/CBC   Other abnormal glucose/hx of prediabetes Education: Reviewed 'ABCs' of diabetes management  Discussed goals to be met and/or maintained include A1C (<7) Blood pressure (<130/80) Cholesterol (LDL <70) Continue Eye Exam yearly  Continue Dental Exam Q6 mo Discussed dietary recommendations Discussed Physical Activity recommendations Foot exam UTD Check A1C   Osteopenia Pursue a combination of weight-bearing exercises and strength training. Advised on fall prevention measures including proper lighting in all rooms, removal of area rugs and floor clutter, use of walking devices as deemed appropriate, avoidance of uneven walking surfaces. Smoking cessation and moderate alcohol consumption if applicable Consume 542 to 1000 IU of vitamin D daily with a goal vitamin D serum value of 30 ng/mL or higher. Aim for 1000 to 1200 mg of elemental calcium daily through supplements and/or dietary sources.   Chronic maxillary sinusitis Monitor; continue daily allergy pill, saline nasal irrigations, nasal steroids PRN, stay well hydrated.   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 Toy Care is managing, hx of trauma Continue medications; declines any changes  Lifestyle discussed: diet/exerise, sleep hygiene, stress management, hydration No SI/HI   IBS (irritable bowel syndrome)-constipation type Continue linzess; PRN senokot and stool softener Increase water intake Discussed lifestyle;  Continue  Probiotic   Asthma, chronic, unspecified asthma severity, uncomplicated Continue medications; recently improved/stable avoid triggers   Migraines Recently improved; has seen neuro; rare eletriptan Stay well hydrated  Raynaud's  Controlled with lifestyle changes  Lower back pain Continue Meloxicam PRN Rest when flared Alternate Ice/Heat  Medication management All medications discussed and reviewed in full. All questions and concerns regarding medications addressed.    Right shoulder pain Start Meloxicam  Take with Pepcid Prednisone taper provided Will obtain x-ray for any underlying injury affecting movement and LROM RICE method Suggested sling support  Orders Placed This Encounter  Procedures   DG Shoulder Right    Standing Status:   Future    Standing Expiration Date:   03/17/2023    Order Specific Question:   Reason for Exam (SYMPTOM  OR DIAGNOSIS REQUIRED)    Answer:   Right should pain, unable to abduct without pain, no injury, repetitive movment painting purgula outdoors for last few months.    Order Specific Question:   Preferred imaging location?    Answer:   [id number]   CBC with Differential/Platelet   COMPLETE METABOLIC PANEL WITH GFR   Meds ordered this encounter  Medications   meloxicam (MOBIC) 15 MG tablet    Sig: Take 1/2 to 1 tablet Daily with Food  for Pain & Inflammation    Dispense:  30 tablet    Refill:  0    Order Specific Question:   Supervising Provider    Answer:   Unk Pinto [[id number]]   predniSONE (DELTASONE) 10 MG tablet    Sig: 1 tab 3 x day for 2 days, then 1 tab 2 x day for 2 days, then 1 tab 1 x day for 3 days    Dispense:  13 tablet    Refill:  0    Order Specific Question:   Supervising Provider    Answer:   Unk Pinto [[id number]]    Over 20 minutes of exam, counseling, chart review and critical decision making was performed Future Appointments  Date Time Provider [place]  06/24/2022 10:00 AM Darrol Jump, NP [facility] None     Plan:   During the course of the visit the patient was educated and counseled about appropriate screening and preventive services including:   Pneumococcal vaccine  Prevnar 13 Influenza vaccine Td vaccine Screening electrocardiogram Bone densitometry screening Colorectal cancer screening Diabetes screening Glaucoma screening Nutrition counseling  Advanced directives: requested   Subjective:  Alexis Weaver is a 71 y.o. female who presents for CPE and follow up. She has Palpitations; Asthma, chronic; Chronic sinusitis; Chronic anxiety; Major depression in partial remission ([place]); Osteopenia; Abnormal glucose; Medication management; IBS (irritable bowel syndrome)-constipation type; Vitamin D deficiency; Labile hypertension; Migraines; Raynaud's phenomenon without gangrene; Raynaud disease; Hyperlipidemia, mixed; and Anterolisthesis of lumbar spine on their problem list.   She is also concerned for right shoulder and arm pain with radiation down to fingertips.  She has been outdoor painting a pergola standing on scaffolding for the last month.  Repetitive movement.  She is now having trouble with ROM.  Unable to extend arm behind head without pain.  Pain with sleeping, awaking her at night.  She has tried IBU without relief.  She has Meloxicam 7.5 mg which is not effective.  No known injury, fall, trauma. She does report digging ditches and planting tress outdoors.  She is concerned about Left lower back/hip tenderness, pain that moves around, typically in muscles, ongoing and seems progressive since July 2022, no injury prior to onset. Hasn't tried anything. Denies weakness or radicular type pain, changes in sensation. Has meloxicam 7.5 mg but hasn't tried taking regularly.     She has tender pressure points, had negative ANA, AntiDNA, RF.  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   She reports long history of migraines;  has seen neuro; much improved since retiring; she does have abortive relpax which works well, needs more with weather changes.   She was seeing a therapist and Dr. Toy Care for chronic depression following trauma in childhood, son with TBI/addiction, taking trazodone for sleep, has been taking adderall XR 10 mg daily. Denies HI/SI.  Has seen Dr. Lucia Gaskins and Donneta Romberg for allergies/asthma but shots didn't help. She is on allegra, and prednisone intermittently.   IBS-C, on linzess 290 mg with sennokot. Well controlled.  Started probiotic. She had normal colonoscopy by Dr. Fuller Plan in 2017.   She has reported Raynaud's, underwent LE vascular US which was unremarkable; was prescribed procardia but was unable to tolerate low BP, prefers managing with lifestyle.  Currently well controlled.  BMI is Body mass index is 20.61 kg/m., she has been working on diet and exercise. Wt Readings from Last 3 Encounters:  03/16/22 103 lb 12.8 oz (47.1 kg)  12/11/21 101 lb 6.4 oz (46 kg)  05/27/21 100 lb 12.8 oz (45.7 kg)    Her blood pressure has been controlled at home (labile, ranges 110/60-rare low 140s/80s), today their BP is BP: 116/80 She does workout. She denies chest pain, shortness of breath, dizziness.    She is not on cholesterol medication and denies myalgias. Her cholesterol is at goal. The cholesterol last visit was:   Lab Results  Component Value Date   CHOL 192 12/11/2021  HDL 97 12/11/2021   LDLCALC 83 12/11/2021   TRIG 50 12/11/2021   CHOLHDL 2.0 12/11/2021   She has hx of prediabetes (A1C 6.0 in 2016, 5.8 in 2020). She has been working on lifestyle for glucose management. Last A1C:  Lab Results  Component Value Date   HGBA1C 5.5 12/11/2021   Lab Results  Component Value Date   GFRNONAA 64 10/07/2020   Patient is on Vitamin D supplement, did reduce from 10000 IU to 5000 IU   Lab Results  Component Value Date   VD25OH 95 12/11/2021         Medication Review: Current Outpatient  Medications on File Prior to Visit  Medication Sig Dispense Refill   amphetamine-dextroamphetamine (ADDERALL XR) 10 MG 24 hr capsule Take 1 capsule Daily for ADD  per Dr Toy Care  0   bimatoprost (LATISSE) 0.03 % ophthalmic solution PLACE 1 DROP ON APPLICATOR AND APPLY EVENLY ALONG SKIN OF UPPER EYELIDS AT BASE OF EYELASHES ONCE DAILY AT BEDTIME 15 mL 3   Cholecalciferol (VITAMIN D3) 125 MCG (5000 UT) CAPS Take 1 cap 4-5 days per week     cycloSPORINE (RESTASIS) 0.05 % ophthalmic emulsion Place 1 drop into both eyes 2 (two) times daily.     Efinaconazole (JUBLIA) 10 % SOLN Apply topically daily.     eletriptan (RELPAX) 40 MG tablet Take 1 tablet for Migraine & may repeat 1 x in 2 hours (Maximum 2 tabs /24 hours) 6 tablet 5   estradiol (VIVELLE-DOT) 0.025 MG/24HR Place 1 patch onto the skin 2 (two) times a week. 24 patch 3   linaclotide (LINZESS) 290 MCG CAPS capsule Take 1 capsule Daily for IBS - C                                                         /                             TAKE                                   BY                                MOUTH 90 capsule 3   Milnacipran HCl (SAVELLA) 100 MG TABS tablet TAKE 2 TABLETS BY MOUTH EVERY DAY FOR MOOD 180 tablet 3   montelukast (SINGULAIR) 10 MG tablet Take 1 tablet daily for Allergies & Asthma 90 tablet 3   ondansetron (ZOFRAN) 8 MG tablet Take1 tablet 2 to 3 x /day as needed for Nausea / Emesis 100 tablet 0   OVER THE COUNTER MEDICATION Apple Cider Vinegar-Take daily     OVER THE COUNTER MEDICATION 500 mg. Vitamin C daily.     OVER THE COUNTER MEDICATION Cinnamon daily     OVER THE COUNTER MEDICATION Zinc 50 mg daily     Sennosides (SENOKOT PO) Take 1 tablet by mouth as needed.      traZODone (DESYREL) 50 MG tablet Take 1 tablet (50 mg total) by mouth at bedtime as needed for sleep. 90 tablet 1   triamterene-hydrochlorothiazide (MAXZIDE) 75-50 MG  tablet TAKE 1 TABLET BY MOUTH DAILY FOR BLOOD PRESSURE AND FLUID RETENTION 90 tablet 3    dexamethasone (DECADRON) 1 MG tablet Take 1 tablet  3 x /day as directed for pain  / Inflammation. (Patient not taking: Reported on 03/16/2022) 50 tablet 0   ibuprofen (ADVIL) 800 MG tablet Take 1 tablet (800 mg total) by mouth every 8 (eight) hours as needed for moderate pain (take for 2 weeks with food, can take with tylenol). (Patient not taking: Reported on 03/16/2022) 90 tablet 0   No current facility-administered medications on file prior to visit.    Allergies  Allergen Reactions   Hydrocodone-Acetaminophen Shortness Of Breath   Lamisil [Terbinafine] Rash   Codeine Hives   Morphine And Related Hives   Nitrofurantoin Other (See Comments)    Makes hands and feet burn   Tetracycline     Current Problems (verified) Patient Active Problem List   Diagnosis Date Noted   Anterolisthesis of lumbar spine 06/03/2021   Hyperlipidemia, mixed 10/06/2020   Migraines 03/22/2020   Raynaud's phenomenon without gangrene 03/22/2020   Raynaud disease 03/22/2020   Labile hypertension 07/07/2017   Abnormal glucose 06/26/2014   Medication management 06/26/2014   IBS (irritable bowel syndrome)-constipation type 06/26/2014   Vitamin D deficiency 06/26/2014   Asthma, chronic    Chronic sinusitis    Chronic anxiety    Major depression in partial remission ([place])    Osteopenia    Palpitations 04/01/2010    Screening Tests Immunization History  Administered Date(s) Administered   DT (Pediatric) 11/06/2004   Influenza, High Dose Seasonal PF 03/23/2019, 03/22/2020, 05/27/2021   Influenza-Unspecified 02/14/2012   PFIZER(Purple Top)SARS-COV-2 Vaccination 10/12/2019, 11/06/2019, 06/01/2020   Pneumococcal Conjugate-13 10/26/2017   Pneumococcal Polysaccharide-23 03/23/2019   Tdap 02/26/2011    Names of Other Physician/Practitioners you currently use: 1. [facility] here for primary care 2. Dr. Delman Cheadle, eye doctor, last visit 05/2021 3. Dr. Melony Overly, dentist, last  visit 05/2021, goes q56m 4. Dr. LAllyson Sabal derm, last 2021, has scheduled 07/2021  Patient Care Team: MUnk Pinto MD as PCP - General (Internal Medicine) SMosetta Anis MD as Referring Physician (Allergy) DMelrose Nakayama MD as Consulting Physician (Orthopedic Surgery)  SURGICAL HISTORY She  has a past surgical history that includes Abdominal hysterectomy; Cesarean section (1979, 1983); Breast enhancement surgery; Laparoscopic abdominal exploration; Hemorrhoid banding; and Appendectomy (1997). FAMILY HISTORY Her family history includes Asthma in her brother; COPD in her brother; Diabetes in her brother and father; Diverticulitis in her maternal grandmother; Hyperlipidemia in her brother; Hypertension in her father and mother; Rheum arthritis in her sister. SOCIAL HISTORY She  reports that she has never smoked. She has never used smokeless tobacco. She reports that she does not drink alcohol and does not use drugs.   Review of Systems  Constitutional:  Negative for chills, diaphoresis, fever, malaise/fatigue and weight loss.  HENT:  Negative for congestion, ear discharge, ear pain, hearing loss, nosebleeds, sore throat and tinnitus.   Eyes: Negative.  Negative for blurred vision and double vision.  Respiratory:  Negative for cough, hemoptysis, sputum production, shortness of breath, wheezing and stridor.   Cardiovascular: Negative.  Negative for chest pain, palpitations, orthopnea, claudication, leg swelling and PND.  Gastrointestinal:  Positive for constipation. Negative for abdominal pain, blood in stool, diarrhea, heartburn, melena, nausea and vomiting.  Genitourinary: Negative.  Negative for dysuria, flank pain, frequency, hematuria and urgency.  Musculoskeletal:  Negative for back pain, falls, joint pain, myalgias and neck  pain.  Skin: Negative.  Negative for rash.  Neurological: Negative.  Negative for dizziness, tingling, sensory change, weakness and headaches.   Endo/Heme/Allergies:  Negative for polydipsia.  Psychiatric/Behavioral:  Positive for depression. Negative for hallucinations, memory loss, substance abuse and suicidal ideas. The patient is not nervous/anxious and does not have insomnia.   All other systems reviewed and are negative.    Objective:     Today's Vitals   03/16/22 1117  BP: 116/80  Pulse: 89  Temp: 97.7 F (36.5 C)  SpO2: 97%  Weight: 103 lb 12.8 oz (47.1 kg)  Height: 4' 11.5" (1.511 m)   Body mass index is 20.61 kg/m.  General appearance: alert, no distress, WD/WN, female HEENT: normocephalic, sclerae anicteric, TMs pearly, nares patent, no discharge or erythema, pharynx normal Oral cavity: MMM, no lesions Neck: supple, no lymphadenopathy, no thyromegaly, no masses Heart: RRR, normal S1, S2, no murmurs Lungs: CTA bilaterally, no wheezes, rhonchi, or rales Breasts: Recent normal mammogram, doing self exams, declines today  Abdomen: +bs, soft, non tender, non distended, no masses, no hepatomegaly, no splenomegaly Musculoskeletal: nontender, no swelling, no obvious deformity. Extremities: no edema, no cyanosis, no clubbing Pulses: 2+ symmetric, upper and lower extremities, normal cap refill Neurological: alert, oriented x 3, CN2-12 intact, strength normal upper extremities and lower extremities, sensation normal throughout, DTRs 2+ throughout, no cerebellar signs, gait normal Psychiatric: normal affect, behavior normal, pleasant  GU: defer   Darrol Jump, NP   03/16/2022

## 2022-03-16 NOTE — Patient Instructions (Signed)

## 2022-03-17 LAB — COMPLETE METABOLIC PANEL WITH GFR
AG Ratio: 1.8 (calc) (ref 1.0–2.5)
ALT: 18 U/L (ref 6–29)
AST: 22 U/L (ref 10–35)
Albumin: 4.3 g/dL (ref 3.6–5.1)
Alkaline phosphatase (APISO): 57 U/L (ref 37–153)
BUN: 16 mg/dL (ref 7–25)
CO2: 29 mmol/L (ref 20–32)
Calcium: 9.3 mg/dL (ref 8.6–10.4)
Chloride: 98 mmol/L (ref 98–110)
Creat: 0.78 mg/dL (ref 0.60–1.00)
Globulin: 2.4 g/dL (calc) (ref 1.9–3.7)
Glucose, Bld: 86 mg/dL (ref 65–99)
Potassium: 3.6 mmol/L (ref 3.5–5.3)
Sodium: 138 mmol/L (ref 135–146)
Total Bilirubin: 0.6 mg/dL (ref 0.2–1.2)
Total Protein: 6.7 g/dL (ref 6.1–8.1)
eGFR: 82 mL/min/{1.73_m2} (ref >=60)

## 2022-03-17 LAB — CBC WITH DIFFERENTIAL/PLATELET
Absolute Monocytes: 402 cells/uL (ref 200–950)
Basophils Absolute: 69 cells/uL (ref 0–200)
Basophils Relative: 1.4 %
Eosinophils Absolute: 78 cells/uL (ref 15–500)
Eosinophils Relative: 1.6 %
HCT: 42.7 % (ref 35.0–45.0)
Hemoglobin: 14.5 g/dL (ref 11.7–15.5)
Lymphs Abs: 1588 cells/uL (ref 850–3900)
MCH: 32.2 pg (ref 27.0–33.0)
MCHC: 34 g/dL (ref 32.0–36.0)
MCV: 94.9 fL (ref 80.0–100.0)
MPV: 10.2 fL (ref 7.5–12.5)
Monocytes Relative: 8.2 %
Neutro Abs: 2764 cells/uL (ref 1500–7800)
Neutrophils Relative %: 56.4 %
Platelets: 262 10*3/uL (ref 140–400)
RBC: 4.5 10*6/uL (ref 3.80–5.10)
RDW: 11.8 % (ref 11.0–15.0)
Total Lymphocyte: 32.4 %
WBC: 4.9 10*3/uL (ref 3.8–10.8)

## 2022-03-25 ENCOUNTER — Other Ambulatory Visit: Payer: Self-pay | Admitting: Nurse Practitioner

## 2022-03-25 DIAGNOSIS — R0989 Other specified symptoms and signs involving the circulatory and respiratory systems: Secondary | ICD-10-CM

## 2022-04-06 ENCOUNTER — Encounter: Payer: Self-pay | Admitting: Nurse Practitioner

## 2022-04-09 ENCOUNTER — Other Ambulatory Visit: Payer: Self-pay | Admitting: Nurse Practitioner

## 2022-04-09 DIAGNOSIS — M79601 Pain in right arm: Secondary | ICD-10-CM

## 2022-04-09 DIAGNOSIS — M8580 Other specified disorders of bone density and structure, unspecified site: Secondary | ICD-10-CM

## 2022-04-09 DIAGNOSIS — M25619 Stiffness of unspecified shoulder, not elsewhere classified: Secondary | ICD-10-CM

## 2022-04-16 ENCOUNTER — Ambulatory Visit (HOSPITAL_BASED_OUTPATIENT_CLINIC_OR_DEPARTMENT_OTHER)
Admission: RE | Admit: 2022-04-16 | Discharge: 2022-04-16 | Disposition: A | Discharge status: Home / Self Care | Payer: Medicare Other | Source: Ambulatory Visit | Attending: Nurse Practitioner | Admitting: Nurse Practitioner

## 2022-04-16 DIAGNOSIS — M25619 Stiffness of unspecified shoulder, not elsewhere classified: Secondary | ICD-10-CM | POA: Diagnosis present

## 2022-04-16 DIAGNOSIS — M79601 Pain in right arm: Secondary | ICD-10-CM | POA: Diagnosis present

## 2022-04-16 DIAGNOSIS — M8580 Other specified disorders of bone density and structure, unspecified site: Secondary | ICD-10-CM | POA: Insufficient documentation

## 2022-04-19 ENCOUNTER — Other Ambulatory Visit: Payer: Self-pay | Admitting: Internal Medicine

## 2022-04-20 ENCOUNTER — Encounter: Payer: Self-pay | Admitting: Nurse Practitioner

## 2022-04-20 DIAGNOSIS — M79601 Pain in right arm: Secondary | ICD-10-CM

## 2022-04-20 DIAGNOSIS — M7552 Bursitis of left shoulder: Secondary | ICD-10-CM

## 2022-04-20 DIAGNOSIS — M25619 Stiffness of unspecified shoulder, not elsewhere classified: Secondary | ICD-10-CM

## 2022-05-13 ENCOUNTER — Other Ambulatory Visit: Payer: Self-pay | Admitting: Nurse Practitioner

## 2022-05-13 ENCOUNTER — Encounter: Payer: Self-pay | Admitting: Internal Medicine

## 2022-05-13 MED ORDER — ONDANSETRON HCL 8 MG PO TABS: ORAL_TABLET | ORAL | 0 refills | Status: DC

## 2022-05-27 ENCOUNTER — Encounter: Payer: Medicare Other | Admitting: Adult Health

## 2022-06-24 ENCOUNTER — Encounter: Payer: Medicare Other | Admitting: Nurse Practitioner

## 2022-07-22 ENCOUNTER — Encounter: Payer: Self-pay | Admitting: Nurse Practitioner

## 2022-07-22 ENCOUNTER — Ambulatory Visit (INDEPENDENT_AMBULATORY_CARE_PROVIDER_SITE_OTHER): Payer: Medicare Other | Admitting: Nurse Practitioner

## 2022-07-22 VITALS — BP 134/74 | HR 89 | Temp 97.9°F | Ht 59.5 in | Wt 104.8 lb

## 2022-07-22 DIAGNOSIS — J320 Chronic maxillary sinusitis: Secondary | ICD-10-CM

## 2022-07-22 DIAGNOSIS — Z0000 Encounter for general adult medical examination without abnormal findings: Secondary | ICD-10-CM

## 2022-07-22 DIAGNOSIS — M545 Low back pain, unspecified: Secondary | ICD-10-CM

## 2022-07-22 DIAGNOSIS — Z136 Encounter for screening for cardiovascular disorders: Secondary | ICD-10-CM

## 2022-07-22 DIAGNOSIS — R0989 Other specified symptoms and signs involving the circulatory and respiratory systems: Secondary | ICD-10-CM

## 2022-07-22 DIAGNOSIS — Z79899 Other long term (current) drug therapy: Secondary | ICD-10-CM

## 2022-07-22 DIAGNOSIS — R7309 Other abnormal glucose: Secondary | ICD-10-CM

## 2022-07-22 DIAGNOSIS — G43909 Migraine, unspecified, not intractable, without status migrainosus: Secondary | ICD-10-CM

## 2022-07-22 DIAGNOSIS — Z1389 Encounter for screening for other disorder: Secondary | ICD-10-CM

## 2022-07-22 DIAGNOSIS — M8580 Other specified disorders of bone density and structure, unspecified site: Secondary | ICD-10-CM

## 2022-07-22 DIAGNOSIS — F3341 Major depressive disorder, recurrent, in partial remission: Secondary | ICD-10-CM

## 2022-07-22 DIAGNOSIS — E782 Mixed hyperlipidemia: Secondary | ICD-10-CM

## 2022-07-22 DIAGNOSIS — M7541 Impingement syndrome of right shoulder: Secondary | ICD-10-CM

## 2022-07-22 DIAGNOSIS — K581 Irritable bowel syndrome with constipation: Secondary | ICD-10-CM

## 2022-07-22 DIAGNOSIS — Z01818 Encounter for other preprocedural examination: Secondary | ICD-10-CM

## 2022-07-22 DIAGNOSIS — J45909 Unspecified asthma, uncomplicated: Secondary | ICD-10-CM

## 2022-07-22 DIAGNOSIS — I7300 Raynaud's syndrome without gangrene: Secondary | ICD-10-CM

## 2022-07-22 DIAGNOSIS — Z0001 Encounter for general adult medical examination with abnormal findings: Secondary | ICD-10-CM

## 2022-07-22 DIAGNOSIS — E559 Vitamin D deficiency, unspecified: Secondary | ICD-10-CM

## 2022-07-22 DIAGNOSIS — E538 Deficiency of other specified B group vitamins: Secondary | ICD-10-CM

## 2022-07-22 MED ORDER — AZITHROMYCIN 500 MG PO TABS: 500.0000 mg | ORAL_TABLET | Freq: Every day | ORAL | 0 refills | Status: AC

## 2022-07-22 NOTE — Progress Notes (Unsigned)
CPE  Assessment:   Encounter for Annual Physical Exam with abnormal findings Due annually  Health Maintenance reviewed Healthy lifestyle reviewed and goals set  Labile hypertension Well controlled off of meds at this time Monitor blood pressure at home; call if consistently over 130/80 Continue DASH diet, lifestyle discussed  Reminder to go to the ER if any CP, SOB, nausea, dizziness, severe HA, changes vision/speech, left arm numbness and tingling and jaw pain.  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 Toy Care is managing, hx of trauma Continue medications; declines any changes  Lifestyle discussed: diet/exerise, sleep hygiene, stress management, hydration No SI/HI  IBS (irritable bowel syndrome)-constipation type Continue linzess 290 mg daily; PRN senokot and stool softener Increase water intake Discussed lifestyle;  May benefit from short trial of probiotic; "GI stability - standard process" and "Align" suggested, 1-3 months, d/c if no benefit and can repeat PRN.   Asthma, chronic, unspecified asthma severity, uncomplicated Continue medications; recently improved/stable avoid triggers   Migraines Recently improved; has seen neuro; rare eletriptan  Raynaud's  Didn't tolerate nifedipine, labile low BPs She reports doing well with lifestyle changes  Lower back pain Rest when flared NSAID/Meloxicam PRN  Impingement syndrome of right shoulder Follows with Ortho Planned surgery 08/2022 Surgical Clearance completed  today.  B12 Defiencey Continue supplement Monitor levels   Screening for blood or protein in urine UA/Microalbumin  Preoperative clearance Completed  Orders Placed This Encounter  Procedures   DG Chest 2 View    Standing Status:   Future    Standing Expiration Date:   07/23/2023    Order Specific Question:   Reason for Exam (SYMPTOM  OR DIAGNOSIS REQUIRED)    Answer:   Surgical Screening    Order Specific Question:   Preferred imaging location?    Answer:   [facility]   DG Bone Density    Standing Status:   Future    Standing Expiration Date:   07/24/2023    Order Specific Question:   Reason for Exam (SYMPTOM  OR DIAGNOSIS REQUIRED)    Answer:   biannual screening    Order Specific Question:   Preferred imaging location?    Answer:   [facility]   CBC with Differential/Platelet   COMPLETE METABOLIC PANEL WITH GFR   Magnesium   Lipid panel   TSH   Hemoglobin A1c   Insulin, random   VITAMIN D 25 Hydroxy (Vit-D Deficiency, Fractures)   Urinalysis, Routine w reflex microscopic   Microalbumin / creatinine urine ratio   Vitamin B12   EKG 12-Lead     Notify office for further evaluation and treatment, questions or concerns if any reported s/s fail to improve.   The patient was advised to call back or seek an in-person evaluation if any symptoms worsen or if the condition fails to improve as anticipated.   Further disposition pending results of labs. Discussed med's effects and SE's.    I discussed the assessment and treatment plan with the patient. The patient was provided an opportunity to ask questions and all were answered. The patient agreed with  the plan and demonstrated an understanding of the instructions.  Discussed med's effects and SE's. Screening labs and tests as requested with regular follow-up as recommended.  I provided 35 minutes of face-to-face time during this encounter including counseling, chart review, and critical decision making was  preformed.  Future Appointments  Date Time Provider [place]  07/26/2023  2:00 PM Darrol Jump, NP [facility] None     Plan:   During the course of the visit the patient was educated and counseled about appropriate screening and preventive services including:   Pneumococcal vaccine  Prevnar 13 Influenza vaccine Td vaccine Screening electrocardiogram Bone densitometry screening Colorectal cancer screening Diabetes screening Glaucoma screening Nutrition counseling  Advanced directives: requested   Subjective:  Alexis Weaver is a 72 y.o. female who presents for CPE and follow up. She has Palpitations; Asthma, chronic; Chronic sinusitis; Chronic anxiety; Major depression in partial remission (Alexis Weaver); Osteopenia; Abnormal glucose; Medication management; IBS (irritable bowel syndrome)-constipation type; Vitamin D deficiency; Labile hypertension; Migraines; Raynaud's phenomenon without gangrene; Raynaud disease; Hyperlipidemia, mixed; and Anterolisthesis of lumbar spine on their problem list.   She is married, 2 children, 1 grandson; retired Marine scientist.   Saw [facility] 06/04/22 for right shoulder pain, impingement syndrome, evaluated for pain that has been > 7 mo without a specific injury.  She is continuing Meloxicam.  Right shoulder MRI was completed 05/04/22 which shows evidence for rotator cuff tendinosis with some partial bursal surface frayng but no obvious discrete full thicknes rotator cuff tear.  She was provided a steroid injection and returned in 1 month without significant resolution.  She now has surgery scheduled for 08/21/22.  She is concerned about Left lower back/hip tenderness, pain that moves around, typically in muscles, ongoing and seems progressive since July 2022, no injury prior to onset. Hasn't tried anything. Denies weakness or radicular type pain, changes in sensation. Has meloxicam but hasn't tried taking regularly.   She has tender pressure points, had  negative ANA, AntiDNA, RF.  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   She reports long history of migraines; has seen neuro; much improved since retiring; she does have abortive relpax which works well, needs more with weather changes.   She was seeing a therapist and Dr. Toy Care for chronic depression following trauma in childhood, son with TBI/addiction, taking trazodone for sleep, has been taking adderall XR 10 mg daily. "I'm doing as well as can be expected."  Has seen Dr. Lucia Gaskins and Donneta Romberg for allergies/asthma but shots didn't help. She is on allegra, and prednisone intermittently.   IBS-C, on linzess 290 mg with sennokot.  She had normal colonoscopy by Dr. Fuller Plan in 2017.   She has reported episodic red/blue/white discoloration and coolness of extremities and underwent LE vascular US which was unremarkable; felt to be Raynaud's, was prescribed procardia but was unable to tolerate low BP, prefers managing with lifestyle.  BMI is Body mass index is 20.81 kg/m., she has been working on diet and exercise, walks regularly. Wt Readings from Last 3 Encounters:  07/22/22 104 lb 12.8 oz (47.5 kg)  03/16/22 103 lb 12.8 oz (47.1 kg)  12/11/21 101 lb 6.4 oz (46 kg)    Her blood pressure has been controlled at home (labile, ranges 110/60-rare low 140s/80s), today their BP is BP: 134/74 She does workout. She denies chest pain, shortness of breath, dizziness.    She is not on cholesterol medication and denies myalgias. Her cholesterol is at  goal. The cholesterol last visit was:   Lab Results  Component Value Date   CHOL 192 12/11/2021   HDL 97 12/11/2021   LDLCALC 83 12/11/2021   TRIG 50 12/11/2021   CHOLHDL 2.0 12/11/2021   She has hx of prediabetes (A1C 6.0 in 2016, 5.8 in 2020). She has been working on lifestyle for glucose management. Last A1C:  Lab Results  Component Value Date   HGBA1C 5.5 12/11/2021   Lab Results  Component Value Date   GFRNONAA 64  10/07/2020   Patient is on Vitamin D supplement, did reduce from 10000 IU to 5000 IU   Lab Results  Component Value Date   VD25OH 95 12/11/2021      Medication Review: Current Outpatient Medications on File Prior to Visit  Medication Sig Dispense Refill   amphetamine-dextroamphetamine (ADDERALL XR) 10 MG 24 hr capsule Take 1 capsule Daily for ADD  per Dr Toy Care  0   bimatoprost (LATISSE) 0.03 % ophthalmic solution PLACE 1 DROP ON APPLICATOR AND APPLY EVENLY ALONG SKIN OF UPPLER EYELID AT BASE OF EYELASHES ONCE AT BEDTIME 5 mL 99   Cholecalciferol (VITAMIN D3) 125 MCG (5000 UT) CAPS Take 1 cap 4-5 days per week     cycloSPORINE (RESTASIS) 0.05 % ophthalmic emulsion Place 1 drop into both eyes 2 (two) times daily.     dexamethasone (DECADRON) 1 MG tablet Take 1 tablet  3 x /day as directed for pain  / Inflammation. (Patient not taking: Reported on 03/16/2022) 50 tablet 0   Efinaconazole (JUBLIA) 10 % SOLN Apply topically daily.     eletriptan (RELPAX) 40 MG tablet Take 1 tablet for Migraine & may repeat 1 x in 2 hours (Maximum 2 tabs /24 hours) 6 tablet 5   estradiol (VIVELLE-DOT) 0.025 MG/24HR Place 1 patch onto the skin 2 (two) times a week. 24 patch 3   ibuprofen (ADVIL) 800 MG tablet Take 1 tablet (800 mg total) by mouth every 8 (eight) hours as needed for moderate pain (take for 2 weeks with food, can take with tylenol). (Patient not taking: Reported on 03/16/2022) 90 tablet 0   linaclotide (LINZESS) 290 MCG CAPS capsule Take 1 capsule Daily for IBS - C                                                         /                             TAKE                                   BY                                MOUTH 90 capsule 3   meloxicam (MOBIC) 15 MG tablet Take 1/2 to 1 tablet Daily with Food  for Pain & Inflammation 30 tablet 0   Milnacipran HCl (SAVELLA) 100 MG TABS tablet TAKE 2 TABLETS BY MOUTH EVERY DAY FOR MOOD 180 tablet 3   montelukast (SINGULAIR) 10 MG tablet Take 1 tablet daily  for Allergies & Asthma 90 tablet 3  ondansetron (ZOFRAN) 8 MG tablet Take1 tablet 2 to 3 x /day as needed for Nausea / Emesis 100 tablet 0   OVER THE COUNTER MEDICATION Apple Cider Vinegar-Take daily     OVER THE COUNTER MEDICATION 500 mg. Vitamin C daily.     OVER THE COUNTER MEDICATION Cinnamon daily     OVER THE COUNTER MEDICATION Zinc 50 mg daily     predniSONE (DELTASONE) 10 MG tablet 1 tab 3 x day for 2 days, then 1 tab 2 x day for 2 days, then 1 tab 1 x day for 3 days 13 tablet 0   Sennosides (SENOKOT PO) Take 1 tablet by mouth as needed.      traZODone (DESYREL) 50 MG tablet Take 1 tablet (50 mg total) by mouth at bedtime as needed for sleep. 90 tablet 1   triamterene-hydrochlorothiazide (MAXZIDE) 75-50 MG tablet ^Take  1 tablet  Daily for BP & Fluid Retention                                                         /                                                        TAKE                            BY                                MOUTH 90 tablet 3   No current facility-administered medications on file prior to visit.    Allergies  Allergen Reactions   Hydrocodone-Acetaminophen Shortness Of Breath   Lamisil [Terbinafine] Rash   Codeine Hives   Morphine And Related Hives   Nitrofurantoin Other (See Comments)    Makes hands and feet burn   Tetracycline     Current Problems (verified) Patient Active Problem List   Diagnosis Date Noted   Anterolisthesis of lumbar spine 06/03/2021   Hyperlipidemia, mixed 10/06/2020   Migraines 03/22/2020   Raynaud's phenomenon without gangrene 03/22/2020   Raynaud disease 03/22/2020   Labile hypertension 07/07/2017   Abnormal glucose 06/26/2014   Medication management 06/26/2014   IBS (irritable bowel syndrome)-constipation type 06/26/2014   Vitamin D deficiency 06/26/2014   Asthma, chronic    Chronic sinusitis    Chronic anxiety    Major depression in partial remission ([place])    Osteopenia    Palpitations 04/01/2010    Screening  Tests Immunization History  Administered Date(s) Administered   DT (Pediatric) 11/06/2004   Influenza, High Dose Seasonal PF 03/23/2019, 03/22/2020, 05/27/2021   Influenza-Unspecified 02/14/2012   PFIZER(Purple Top)SARS-COV-2 Vaccination 10/12/2019, 11/06/2019, 06/01/2020   Pneumococcal Conjugate-13 10/26/2017   Pneumococcal Polysaccharide-23 03/23/2019   Tdap 02/26/2011    Tetanus: 2012 - boost if needed per patient   Pneumovax: 2020 Prevnar 13: 10/2017 Flu vaccine: 02/2022 Shingrix: check with insurance  Covid 19: 2/2, 2021, pfizer + booster  Pap: 2016, declines another, never abnormal pap  MGM: 02/2022 [facility]  DEXA: 12/2020 R  fem T -2.2 (mild improvement) Due 12/2022 - ordered  Colonoscopy: 2017  Dr. Russella Dar 10 years EGD: 10/2015  Names of Other Physician/Practitioners you currently use: 1. [facility] here for primary care 2. Dr. Emily Filbert, eye doctor, last visit 05/2021 3. Dr. Fanny Dance, dentist, last visit 05/2021, goes q37m  4. Dr. Terri Piedra, derm, last 2021, has scheduled 07/2021  Patient Care Team: [name], MD as PCP - General (Internal Medicine) Sidney Ace, MD as Referring Physician (Allergy) Marcene Corning, MD as Consulting Physician (Orthopedic Surgery)  SURGICAL HISTORY She  has a past surgical history that includes Abdominal hysterectomy; Cesarean section (1979, 1983); Breast enhancement surgery; Laparoscopic abdominal exploration; Hemorrhoid banding; and Appendectomy (1997). FAMILY HISTORY Her family history includes Asthma in her brother; COPD in her brother; Diabetes in her brother and father; Diverticulitis in her maternal grandmother; Hyperlipidemia in her brother; Hypertension in her father and mother; Rheum arthritis in her sister. SOCIAL HISTORY She  reports that she has never smoked. She has never used smokeless tobacco. She reports that she does not drink alcohol and does not use drugs.   Review of Systems   Constitutional:  Negative for chills, diaphoresis, fever, malaise/fatigue and weight loss.  HENT:  Negative for congestion, ear discharge, ear pain, hearing loss, nosebleeds, sore throat and tinnitus.   Eyes: Negative.  Negative for blurred vision and double vision.  Respiratory:  Negative for cough, hemoptysis, sputum production, shortness of breath, wheezing and stridor.   Cardiovascular: Negative.  Negative for chest pain, palpitations, orthopnea, claudication, leg swelling and PND.  Gastrointestinal:  Positive for constipation. Negative for abdominal pain, blood in stool, diarrhea, heartburn, melena, nausea and vomiting.  Genitourinary: Negative.  Negative for dysuria, flank pain, frequency, hematuria and urgency.  Musculoskeletal:  Negative for back pain, falls, joint pain, myalgias and neck pain.  Skin: Negative.  Negative for rash.  Neurological: Negative.  Negative for dizziness, tingling, sensory change, weakness and headaches.  Endo/Heme/Allergies:  Negative for polydipsia.  Psychiatric/Behavioral:  Positive for depression. Negative for hallucinations, memory loss, substance abuse and suicidal ideas. The patient is not nervous/anxious and does not have insomnia.   All other systems reviewed and are negative.    Objective:     Today's Vitals   07/22/22 1406  BP: 134/74  Pulse: 89  Temp: 97.9 F (36.6 C)  SpO2: 95%  Weight: 104 lb 12.8 oz (47.5 kg)  Height: 4' 11.5" (1.511 m)   Body mass index is 20.81 kg/m.  General appearance: alert, no distress, WD/WN, female HEENT: normocephalic, sclerae anicteric, TMs pearly, nares patent, no discharge or erythema, pharynx normal Oral cavity: MMM, no lesions Neck: supple, no lymphadenopathy, no thyromegaly, no masses Heart: RRR, normal S1, S2, no murmurs Lungs: CTA bilaterally, no wheezes, rhonchi, or rales Breasts: Recent normal mammogram, doing self exams, declines today  Abdomen: +bs, soft, non tender, non distended, no masses,  no hepatomegaly, no splenomegaly Musculoskeletal: nontender, no swelling, no obvious deformity. Extremities: no edema, no cyanosis, no clubbing Pulses: 2+ symmetric, upper and lower extremities, normal cap refill Neurological: alert, oriented x 3, CN2-12 intact, strength normal upper extremities and lower extremities, sensation normal throughout, DTRs 2+ throughout, no cerebellar signs, gait normal Psychiatric: normal affect, behavior normal, pleasant  GU: defer  EKG: sinus arrhythmia; No st changes; WNL  [name], NP   07/22/2022

## 2022-07-22 NOTE — Patient Instructions (Signed)

## 2022-07-23 ENCOUNTER — Other Ambulatory Visit: Payer: Self-pay | Admitting: Nurse Practitioner

## 2022-07-23 ENCOUNTER — Encounter: Payer: Self-pay | Admitting: Nurse Practitioner

## 2022-07-23 DIAGNOSIS — R7989 Other specified abnormal findings of blood chemistry: Secondary | ICD-10-CM

## 2022-07-23 DIAGNOSIS — E876 Hypokalemia: Secondary | ICD-10-CM

## 2022-07-23 LAB — LIPID PANEL
Cholesterol: 193 mg/dL (ref <200)
HDL: 92 mg/dL (ref >=50)
LDL Cholesterol (Calc): 87 mg/dL (calc)
Non-HDL Cholesterol (Calc): 101 mg/dL (calc) (ref <130)
Total CHOL/HDL Ratio: 2.1 (calc) (ref <5.0)
Triglycerides: 54 mg/dL (ref <150)

## 2022-07-23 LAB — CBC WITH DIFFERENTIAL/PLATELET
Absolute Monocytes: 329 cells/uL (ref 200–950)
Basophils Absolute: 50 cells/uL (ref 0–200)
Basophils Relative: 1.1 %
Eosinophils Absolute: 32 cells/uL (ref 15–500)
Eosinophils Relative: 0.7 %
HCT: 41.8 % (ref 35.0–45.0)
Hemoglobin: 14.3 g/dL (ref 11.7–15.5)
Lymphs Abs: 1908 cells/uL (ref 850–3900)
MCH: 31.6 pg (ref 27.0–33.0)
MCHC: 34.2 g/dL (ref 32.0–36.0)
MCV: 92.5 fL (ref 80.0–100.0)
MPV: 10.6 fL (ref 7.5–12.5)
Monocytes Relative: 7.3 %
Neutro Abs: 2183 cells/uL (ref 1500–7800)
Neutrophils Relative %: 48.5 %
Platelets: 257 10*3/uL (ref 140–400)
RBC: 4.52 10*6/uL (ref 3.80–5.10)
RDW: 12.2 % (ref 11.0–15.0)
Total Lymphocyte: 42.4 %
WBC: 4.5 10*3/uL (ref 3.8–10.8)

## 2022-07-23 LAB — URINALYSIS, ROUTINE W REFLEX MICROSCOPIC
Bilirubin Urine: NEGATIVE
Glucose, UA: NEGATIVE
Hgb urine dipstick: NEGATIVE
Ketones, ur: NEGATIVE
Leukocytes,Ua: NEGATIVE
Nitrite: NEGATIVE
Protein, ur: NEGATIVE
Specific Gravity, Urine: 1.01 (ref 1.001–1.035)
pH: 7 (ref 5.0–8.0)

## 2022-07-23 LAB — COMPLETE METABOLIC PANEL WITH GFR
AG Ratio: 2 (calc) (ref 1.0–2.5)
ALT: 14 U/L (ref 6–29)
AST: 17 U/L (ref 10–35)
Albumin: 4.4 g/dL (ref 3.6–5.1)
Alkaline phosphatase (APISO): 51 U/L (ref 37–153)
BUN: 12 mg/dL (ref 7–25)
CO2: 32 mmol/L (ref 20–32)
Calcium: 9.5 mg/dL (ref 8.6–10.4)
Chloride: 96 mmol/L — ABNORMAL LOW (ref 98–110)
Creat: 0.75 mg/dL (ref 0.60–1.00)
Globulin: 2.2 g/dL (calc) (ref 1.9–3.7)
Glucose, Bld: 104 mg/dL — ABNORMAL HIGH (ref 65–99)
Potassium: 3.3 mmol/L — ABNORMAL LOW (ref 3.5–5.3)
Sodium: 137 mmol/L (ref 135–146)
Total Bilirubin: 0.8 mg/dL (ref 0.2–1.2)
Total Protein: 6.6 g/dL (ref 6.1–8.1)
eGFR: 85 mL/min/{1.73_m2} (ref >=60)

## 2022-07-23 LAB — HEMOGLOBIN A1C
Hgb A1c MFr Bld: 5.8 % of total Hgb — ABNORMAL HIGH (ref <5.7)
Mean Plasma Glucose: 120 mg/dL
eAG (mmol/L): 6.6 mmol/L

## 2022-07-23 LAB — INSULIN, RANDOM: Insulin: 4.9 u[IU]/mL

## 2022-07-23 LAB — MICROALBUMIN / CREATININE URINE RATIO
Creatinine, Urine: 41 mg/dL (ref 20–275)
Microalb, Ur: <0.2 mg/dL

## 2022-07-23 LAB — TSH: TSH: 1.71 mIU/L (ref 0.40–4.50)

## 2022-07-23 LAB — VITAMIN B12: Vitamin B-12: 1842 pg/mL — ABNORMAL HIGH (ref 200–1100)

## 2022-07-23 LAB — MAGNESIUM: Magnesium: 1.7 mg/dL (ref 1.5–2.5)

## 2022-07-23 LAB — VITAMIN D 25 HYDROXY (VIT D DEFICIENCY, FRACTURES): Vit D, 25-Hydroxy: 90 ng/mL (ref 30–100)

## 2022-07-27 ENCOUNTER — Ambulatory Visit
Admission: RE | Admit: 2022-07-27 | Discharge: 2022-07-27 | Disposition: A | Discharge status: Home / Self Care | Payer: Medicare Other | Source: Ambulatory Visit | Attending: Nurse Practitioner | Admitting: Nurse Practitioner

## 2022-07-27 ENCOUNTER — Other Ambulatory Visit: Payer: Medicare Other

## 2022-07-27 DIAGNOSIS — Z79899 Other long term (current) drug therapy: Secondary | ICD-10-CM

## 2022-07-27 DIAGNOSIS — E876 Hypokalemia: Secondary | ICD-10-CM

## 2022-07-27 DIAGNOSIS — Z01818 Encounter for other preprocedural examination: Secondary | ICD-10-CM

## 2022-07-27 DIAGNOSIS — R7989 Other specified abnormal findings of blood chemistry: Secondary | ICD-10-CM

## 2022-07-28 LAB — COMPLETE METABOLIC PANEL WITH GFR
AG Ratio: 1.9 (calc) (ref 1.0–2.5)
ALT: 16 U/L (ref 6–29)
AST: 20 U/L (ref 10–35)
Albumin: 3.9 g/dL (ref 3.6–5.1)
Alkaline phosphatase (APISO): 46 U/L (ref 37–153)
BUN: 18 mg/dL (ref 7–25)
CO2: 31 mmol/L (ref 20–32)
Calcium: 9.1 mg/dL (ref 8.6–10.4)
Chloride: 103 mmol/L (ref 98–110)
Creat: 0.73 mg/dL (ref 0.60–1.00)
Globulin: 2.1 g/dL (calc) (ref 1.9–3.7)
Glucose, Bld: 84 mg/dL (ref 65–99)
Potassium: 4 mmol/L (ref 3.5–5.3)
Sodium: 141 mmol/L (ref 135–146)
Total Bilirubin: 0.7 mg/dL (ref 0.2–1.2)
Total Protein: 6 g/dL — ABNORMAL LOW (ref 6.1–8.1)
eGFR: 88 mL/min/{1.73_m2} (ref >=60)

## 2022-08-15 ENCOUNTER — Encounter: Payer: Self-pay | Admitting: Nurse Practitioner

## 2022-08-16 ENCOUNTER — Other Ambulatory Visit: Payer: Self-pay | Admitting: Internal Medicine

## 2022-08-16 MED ORDER — BIMATOPROST 0.03 % EX SOLN: CUTANEOUS | 99 refills | Status: DC

## 2022-09-16 ENCOUNTER — Encounter: Payer: Self-pay | Admitting: Nurse Practitioner

## 2022-09-16 DIAGNOSIS — F909 Attention-deficit hyperactivity disorder, unspecified type: Secondary | ICD-10-CM

## 2022-09-17 MED ORDER — AMPHETAMINE-DEXTROAMPHET ER 15 MG PO CP24: ORAL_CAPSULE | ORAL | 0 refills | Status: DC

## 2022-11-10 ENCOUNTER — Encounter: Payer: Self-pay | Admitting: Nurse Practitioner

## 2022-11-10 MED ORDER — AMPHETAMINE-DEXTROAMPHET ER 15 MG PO CP24: ORAL_CAPSULE | ORAL | 0 refills | Status: DC

## 2022-12-24 ENCOUNTER — Other Ambulatory Visit: Payer: Self-pay | Admitting: Internal Medicine

## 2022-12-24 ENCOUNTER — Encounter: Payer: Self-pay | Admitting: Nurse Practitioner

## 2022-12-24 ENCOUNTER — Other Ambulatory Visit: Payer: Self-pay | Admitting: Nurse Practitioner

## 2022-12-24 DIAGNOSIS — F5101 Primary insomnia: Secondary | ICD-10-CM

## 2022-12-24 MED ORDER — AMPHETAMINE-DEXTROAMPHET ER 15 MG PO CP24: ORAL_CAPSULE | ORAL | 0 refills | Status: DC

## 2022-12-24 MED ORDER — TRAZODONE HCL 50 MG PO TABS: ORAL_TABLET | ORAL | 1 refills | Status: DC

## 2022-12-25 ENCOUNTER — Other Ambulatory Visit: Payer: Self-pay | Admitting: Internal Medicine

## 2022-12-25 ENCOUNTER — Encounter: Payer: Self-pay | Admitting: Nurse Practitioner

## 2022-12-25 MED ORDER — SAVELLA 100 MG PO TABS: ORAL_TABLET | ORAL | 3 refills | Status: DC

## 2023-01-08 ENCOUNTER — Other Ambulatory Visit: Payer: Self-pay | Admitting: Internal Medicine

## 2023-01-08 DIAGNOSIS — F3341 Major depressive disorder, recurrent, in partial remission: Secondary | ICD-10-CM

## 2023-01-08 MED ORDER — SAVELLA 100 MG PO TABS: ORAL_TABLET | ORAL | 3 refills | Status: DC

## 2023-01-18 ENCOUNTER — Encounter: Payer: Self-pay | Admitting: Nurse Practitioner

## 2023-01-18 MED ORDER — BIMATOPROST 0.03 % EX SOLN: CUTANEOUS | 99 refills | Status: DC

## 2023-01-20 ENCOUNTER — Encounter: Payer: Self-pay | Admitting: Internal Medicine

## 2023-01-20 ENCOUNTER — Ambulatory Visit (INDEPENDENT_AMBULATORY_CARE_PROVIDER_SITE_OTHER): Payer: Medicare Other | Admitting: Internal Medicine

## 2023-01-20 VITALS — BP 118/78 | HR 74 | Temp 97.9°F | Resp 17 | Ht 59.5 in | Wt 103.2 lb

## 2023-01-20 DIAGNOSIS — R7309 Other abnormal glucose: Secondary | ICD-10-CM

## 2023-01-20 DIAGNOSIS — R0989 Other specified symptoms and signs involving the circulatory and respiratory systems: Secondary | ICD-10-CM

## 2023-01-20 DIAGNOSIS — E559 Vitamin D deficiency, unspecified: Secondary | ICD-10-CM | POA: Diagnosis not present

## 2023-01-20 DIAGNOSIS — Z79899 Other long term (current) drug therapy: Secondary | ICD-10-CM

## 2023-01-20 DIAGNOSIS — E782 Mixed hyperlipidemia: Secondary | ICD-10-CM

## 2023-01-20 DIAGNOSIS — J45909 Unspecified asthma, uncomplicated: Secondary | ICD-10-CM

## 2023-01-20 DIAGNOSIS — G43909 Migraine, unspecified, not intractable, without status migrainosus: Secondary | ICD-10-CM

## 2023-01-20 LAB — CBC WITH DIFFERENTIAL/PLATELET
Absolute Monocytes: 360 cells/uL (ref 200–950)
Basophils Absolute: 70 cells/uL (ref 0–200)
Basophils Relative: 1.4 %
Eosinophils Absolute: 130 cells/uL (ref 15–500)
Eosinophils Relative: 2.6 %
HCT: 39.1 % (ref 35.0–45.0)
Hemoglobin: 13.2 g/dL (ref 11.7–15.5)
Lymphs Abs: 1800 cells/uL (ref 850–3900)
MCH: 31.8 pg (ref 27.0–33.0)
MCHC: 33.8 g/dL (ref 32.0–36.0)
MCV: 94.2 fL (ref 80.0–100.0)
MPV: 10.5 fL (ref 7.5–12.5)
Monocytes Relative: 7.2 %
Neutro Abs: 2640 cells/uL (ref 1500–7800)
Neutrophils Relative %: 52.8 %
Platelets: 248 10*3/uL (ref 140–400)
RBC: 4.15 10*6/uL (ref 3.80–5.10)
RDW: 11.9 % (ref 11.0–15.0)
Total Lymphocyte: 36 %
WBC: 5 10*3/uL (ref 3.8–10.8)

## 2023-01-20 MED ORDER — DEXAMETHASONE 1 MG PO TABS
ORAL_TABLET | ORAL | 0 refills | Status: DC
Start: 2023-01-20 — End: 2023-10-07

## 2023-01-20 NOTE — Patient Instructions (Signed)

## 2023-01-20 NOTE — Progress Notes (Signed)
Future Appointments  Date Time Provider Department  01/20/2023                 6 mo ov  9:30 AM [name], MD [facility]  07/26/2023               cpe  2:00 PM [name], NP [facility]    History of Present Illness:      This very nice 72 y.o. MWF presents for 6 month follow up with HTN, HLD, Pre-Diabetes, hx/o Migraine, hx/o Raynaud's  and Vitamin D Deficiency.        Patient has hx/o mild labile HTN  treated w/Maxzide  & BP has been controlled at home. Today's BP is at goal - 118/78 .  Patient has had no complaints of any cardiac type chest pain, palpitations, dyspnea Alexis Weaver /PND, dizziness, claudication or dependent edema.       Hyperlipidemia is controlled with diet. Last Lipids were at goal :  Lab Results  Component Value Date   CHOL 193 07/22/2022   HDL 92 07/22/2022   LDLCALC 87 07/22/2022   TRIG 54 07/22/2022   CHOLHDL 2.1 07/22/2022     Also, the patient has history of PreDiabetes (A1c 5.9% /2015 and 6.0% /2016) and has had no symptoms of reactive hypoglycemia, diabetic polys, paresthesias or visual blurring.  Last A1c was normal & at goal :  Lab Results  Component Value Date   HGBA1C 5.8 (H) 07/22/2022                                                           Further, the patient also has history of Vitamin D Deficiency, PreDiabetes  (A1c 5.9% /2015 and 6.0% /2016)  and supplements vitamin D .   Last vitamin D was at goal :   Lab Results  Component Value Date   VD25OH 90 07/22/2022     Current Outpatient Medications on File Prior to Visit  Medication Sig   ADDERALL XR 15 MG  Take one capsule (15 mg) two times a day.   bimatoprost (LATISSE) 0.03 % ophth  soln  APPLY  EYELASHES AT BEDTIME   Cholecalciferol (VITAMIN D3) 125 MCG (5000 UT) CAPS Take 1 cap 4-5 days per week   cycloSPORINE (RESTASIS) 0.05 % ophthalmic emulsion Place 1 drop into both eyes 2 (two) times daily.   Efinaconazole (JUBLIA) 10 % SOLN Apply topically daily.    eletriptan (RELPAX) 40 MG tablet Take 1 tablet for Migraine & may repeat 1 x in 2 hours    estradiol (VIVELLE-DOT) 0.025 MG/24HR PLACE 1 PATCH ONTO THE SKIN 2 TIMES A WEEK.   Fluorouracil (TOLAK EX) Apply topically.   linaclotide (LINZESS) 290 MCG CAPS  Take 1 capsule Daily    meloxica 15 MG tablet Take 1/2 to 1 tablet Daily    Milnacipran  (SAVELLA) 100 MG TABS  Take  2 tablets ( 200 mg) Daily  for  Mood   montelukast 10 MG tablet Take 1 tablet daily for Allergies & Asthma   ondansetron (ZOFRAN) 8 MG tablet Take1 tablet 2 to 3 x /day as needed    OVER THE COUNTER MEDICATION Apple Cider Vinegar-Take daily   OVER THE COUNTER MEDICATION 500 mg. Vitamin C daily.   OVER THE COUNTER MEDICATION Cinnamon  daily   OVER THE COUNTER MEDICATION Zinc 50 mg daily   Sennosides (SENOKOT PO) Take 1 tablet as needed.    traZODone (DESYREL) 50 MG tablet Take  1-3 tabs  1-2 hrs before Bedtime  as needed    triamterene-hctz 75-50 MG tablet Take  1 tablet  Daily          Allergies  Allergen Reactions   Hydrocodone-Acetaminophen Shortness Of Breath   Lamisil [Terbinafine] Rash   Codeine Hives   Morphine And Codeine Hives   Nitrofurantoin Other (See Comments)    Makes hands and feet burn   Tetracycline      PMHx:   Past Medical History:  Diagnosis Date   Allergy    Anemia 2016   Anxiety    Asthma    Chronic headaches    Chronic sinusitis    Depression    Fibromyalgia    GERD (gastroesophageal reflux disease)    IBS (irritable bowel syndrome)    Meckel diverticulum 1997   Meckel's diverticulum    Migraines 03/22/2020   Neuromuscular disorder (HCC)    fibromyagia   Osteopenia      Immunization History  Administered Date(s) Administered   DT 11/06/2004   Influenza, High Dose  03/23/2019, 03/22/2020, 05/27/2021   Influenza 02/14/2012   PFIZER-SARS-COV Vacc 10/12/2019, 11/06/2019, 06/01/2020   Pneumococcal -13 10/26/2017   Pneumococcal -23 03/23/2019   Tdap 02/26/2011     Past  Surgical History:  Procedure Laterality Date   ABDOMINAL HYSTERECTOMY     tubes/ovaries spared   APPENDECTOMY  1997   BREAST ENHANCEMENT SURGERY     CESAREAN SECTION  1979, 1983   x 2   HEMORRHOID BANDING     LAPAROSCOPIC ABDOMINAL EXPLORATION     peritonitis, appendectomy    FHx:    Reviewed / unchanged  SHx:    Reviewed / unchanged   Systems Review:  Constitutional: Denies fever, chills, wt changes, headaches, insomnia, fatigue, night sweats, change in appetite. Eyes: Denies redness, blurred vision, diplopia, discharge, itchy, watery eyes.  ENT: Denies discharge, congestion, post nasal drip, epistaxis, sore throat, earache, hearing loss, dental pain, tinnitus, vertigo, sinus pain, snoring.  CV: Denies chest pain, palpitations, irregular heartbeat, syncope, dyspnea, diaphoresis, orthopnea, PND, claudication or edema. Respiratory: denies cough, dyspnea, DOE, pleurisy, hoarseness, laryngitis, wheezing.  Gastrointestinal: Denies dysphagia, odynophagia, heartburn, reflux, water brash, abdominal pain or cramps, nausea, vomiting, bloating, diarrhea, constipation, hematemesis, melena, hematochezia  or hemorrhoids. Genitourinary: Denies dysuria, frequency, urgency, nocturia, hesitancy, discharge, hematuria or flank pain. Musculoskeletal: Denies arthralgias, myalgias, stiffness, jt. swelling, pain, limping or strain/sprain.  Skin: Denies pruritus, rash, hives, warts, acne, eczema or change in skin lesion(s). Neuro: No weakness, tremor, incoordination, spasms, paresthesia or pain. Psychiatric: Denies confusion, memory loss or sensory loss. Endo: Denies change in weight, skin or hair change.  Heme/Lymph: No excessive bleeding, bruising or enlarged lymph nodes.  Physical Exam  BP 118/78   Pulse 74   Temp 97.9 F (36.6 C)   Resp 17   Ht 4' 11.5" (1.511 m)   Wt 103 lb 3.2 oz (46.8 kg)   SpO2 98%   BMI 20.50 kg/m   Appears  well nourished, well groomed  and in no distress.  Eyes:  PERRLA, EOMs, conjunctiva no swelling or erythema. Sinuses: No frontal/maxillary tenderness ENT/Mouth: EAC's clear, TM's nl w/o erythema, bulging. Nares clear w/o erythema, swelling, exudates. Oropharynx clear without erythema or exudates. Oral hygiene is good. Tongue normal, non obstructing. Hearing intact.  Neck: Supple.  Thyroid not palpable. Car 2+/2+ without bruits, nodes or JVD. Chest: Respirations nl with BS clear & equal w/o rales, rhonchi, wheezing or stridor.  Cor: Heart sounds normal w/ regular rate and rhythm without sig. murmurs, gallops, clicks or rubs. Peripheral pulses normal and equal  without edema.  Abdomen: Soft & bowel sounds normal. Non-tender w/o guarding, rebound, hernias, masses or organomegaly.  Lymphatics: Unremarkable.  Musculoskeletal: Full ROM all peripheral extremities, joint stability, 5/5 strength and normal gait.  Skin: Warm, dry without exposed rashes, lesions or ecchymosis apparent.  Neuro: Cranial nerves intact, reflexes equal bilaterally. Sensory-motor testing grossly intact. Tendon reflexes grossly intact.  Pysch: Alert & oriented x 3.  Insight and judgement nl & appropriate. No ideations.  Assessment and Plan:  1. Labile hypertension  - Continue medication, monitor blood pressure at home.  - Continue DASH diet.  Reminder to go to the ER if any CP,  SOB, nausea, dizziness, severe HA, changes vision/speech.   - CBC with Differential/Platelet - COMPLETE METABOLIC PANEL WITH GFR - Magnesium - TSH   2. Hyperlipidemia, mixed  - Continue diet/meds, exercise,& lifestyle modifications.  - Continue monitor periodic cholesterol/liver & renal functions    - Lipid panel - TSH   3. Abnormal glucose  - Continue diet, exercise  - Lifestyle modifications.  - Monitor appropriate labs.   - Hemoglobin A1c - Insulin, random   4. Vitamin D deficiency  - Continue supplementation    - VITAMIN D 25 Hydroxy    5. Migraine    6. Medication  management  - CBC with Differential/Platelet - COMPLETE METABOLIC PANEL WITH GFR - Magnesium - Lipid panel - TSH - Hemoglobin A1c - Insulin, random - VITAMIN D 25 Hydroxy          Discussed  regular exercise, BP monitoring, weight control to achieve/maintain BMI less than 25 and discussed med and SE's. Recommended labs to assess /monitor clinical status .  I discussed the assessment and treatment plan with the patient. The patient was provided an opportunity to ask questions and all were answered. The patient agreed with the plan and demonstrated an understanding of the instructions.  I provided over 30 minutes of exam, counseling, chart review and  complex critical decision making.        The patient was advised to call back or seek an in-person evaluation if the symptoms worsen or if the condition fails to improve as anticipated.   Marinus Maw, MD

## 2023-01-21 NOTE — Progress Notes (Signed)
^<^<^<^<^<^<^<^<^<^<^<^<^<^<^<^<^<^<^<^<^<^<^<^<^<^<^<^<^<^<^<^<^<^<^<^<^ ^>^>^>^>^>^>^>^>^>^>^>>^>^>^>^>^>^>^>^>^>^>^>^>^>^>^>^>^>^>^>^>^>^>^>^>^>  -  Test results slightly outside the reference range are not unusual. If there is anything important, I will review this with you,  otherwise it is considered normal test values.  If you have further questions,  please do not hesitate to contact me at the office or via My Chart.   ^<^<^<^<^<^<^<^<^<^<^<^<^<^<^<^<^<^<^<^<^<^<^<^<^<^<^<^<^<^<^<^<^<^<^<^<^ ^>^>^>^>^>^>^>^>^>^>^>^>^>^>^>^>^>^>^>^>^>^>^>^>^>^>^>^>^>^>^>^>^>^>^>^>^  - A1c =  5.7%    - Borderline  in prediabetic range                                                      - So very important to work on a better diet  Also suggest taking Cinnamon which is known to  increase Insulin sensitivity &  therefore decrease Insulin resistance of Diabetes & PreDiabetes  - Recommend "naturebell" on [employer] Cinnamon                                                         2 capsules = 9,000 mg               240 capsules for $16.95                If take 1 capsule Daily, that's an 8 month supply  - Also recommend take Vinegar - 1 ounce in water or other drink 2 x /day                                  is also known to lower blood sugar & A1c ! -  For diet    - Avoid Sweets, Candy & White Stuff   - White Rice, White Potatoes, White Flour  - Breads &  Pasta  ^<^<^<^<^<^<^<^<^<^<^<^<^<^<^<^<^<^<^<^<^<^<^<^<^<^<^<^<^<^<^<^<^<^<^<^<^ ^>^>^>^>^>^>^>^>^>^>^>^>^>^>^>^>^>^>^>^>^>^>^>^>^>^>^>^>^>^>^>^>^>^>^>^>^  -  Chol = 181  /  LDL = 80    - Both  Excellent   - Very low risk for Heart Attack  / Stroke  ^>^>^>^>^>^>^>^>^>^>^>^>^>^>^>^>^>^>^>^>^>^>^>^>^>^>^>^>^>^>^>^>^>^>^>^>^ ^>^>^>^>^>^>^>^>^>^>^>^>^>^>^>^>^>^>^>^>^>^>^>^>^>^>^>^>^>^>^>^>^>^>^>^>^  -  Vitamin D = 95 - Excellent -  Please keep dosage same   ^<^<^<^<^<^<^<^<^<^<^<^<^<^<^<^<^<^<^<^<^<^<^<^<^<^<^<^<^<^<^<^<^<^<^<^<^ ^>^>^>^>^>^>^>^>^>^>^>^>^>^>^>^>^>^>^>^>^>^>^>^>^>^>^>^>^>^>^>^>^>^>^>^>^  -  All Else - CBC - Kidneys - Electrolytes - Liver - Magnesium & Thyroid    - all  Normal / OK  ^<^<^<^<^<^<^<^<^<^<^<^<^<^<^<^<^<^<^<^<^<^<^<^<^<^<^<^<^<^<^<^<^<^<^<^<^ ^>^>^>^>^>^>^>^>^>^>^>^>^>^>^>^>^>^>^>^>^>^>^>^>^>^>^>^>^>^>^>^>^>^>^>^>^  -  Keep up the [place] work  !   ^<^<^<^<^<^<^<^<^<^<^<^<^<^<^<^<^<^<^<^<^<^<^<^<^<^<^<^<^<^<^<^<^<^<^<^<^ ^>^>^>^>^>^>^>^>^>^>^>^>^>^>^>^>^>^>^>^>^>^>^>^>^>^>^>^>^>^>^>^>^>^>^>^>^             ^<^<^<^<^<^<^<^<^<^<^<^<^<^<^<^<^<^<^<^<^<^<^<^<^<^<^<^<^<^<^<^<^<^<^<^<^ ^>^>^>^>^>^>^>^>^>^>^>^>^>^>^>^>^>^>^>^>^>^>^>^>^>^>^>^>^>^>^>^>^>^>^>^>^

## 2023-01-25 ENCOUNTER — Encounter: Payer: Self-pay | Admitting: Internal Medicine

## 2023-02-16 LAB — HM MAMMOGRAPHY

## 2023-02-16 LAB — HM DEXA SCAN

## 2023-02-22 ENCOUNTER — Encounter: Payer: Self-pay | Admitting: Internal Medicine

## 2023-02-26 IMAGING — CR DG LUMBAR SPINE COMPLETE 4+V
5 series · 5 of 5 positions shown · non-contrast
Comparison: CT abdomen pelvis 12/24/2011

CLINICAL DATA: Chronic back pain

EXAM:
LUMBAR SPINE - COMPLETE 4+ VIEW

[w lumbar spine ap]
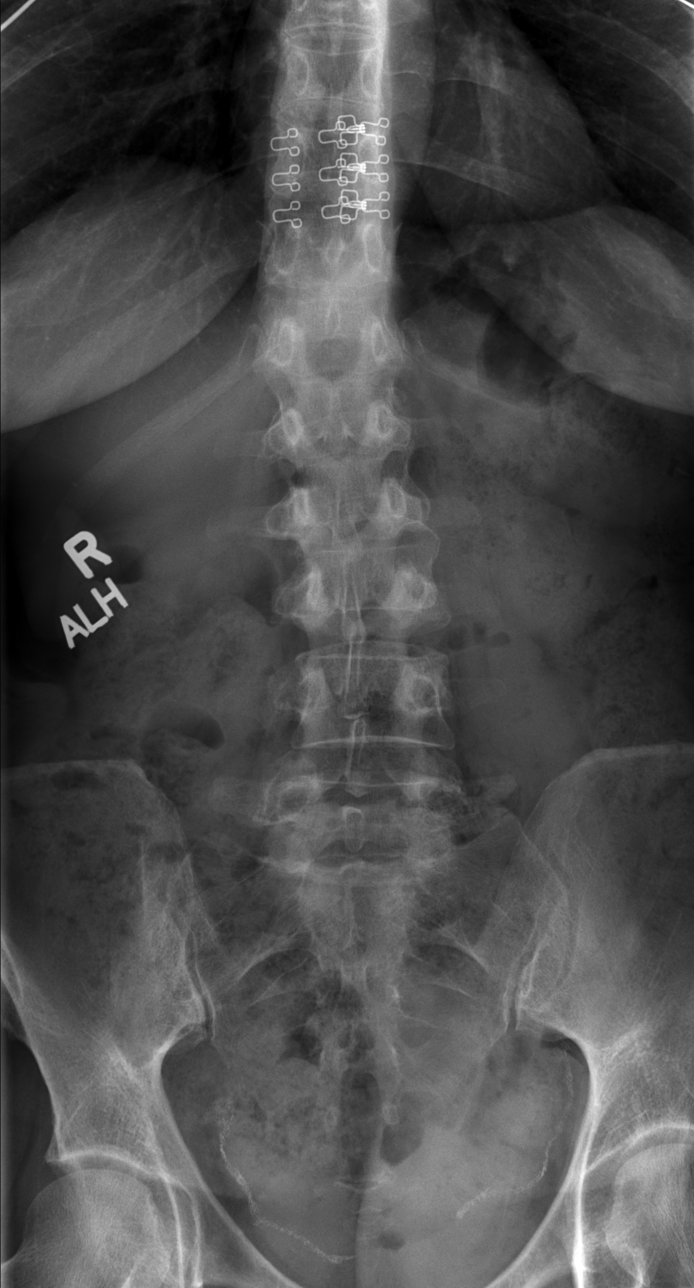

[w lumbar spine obl (1 of 2)]
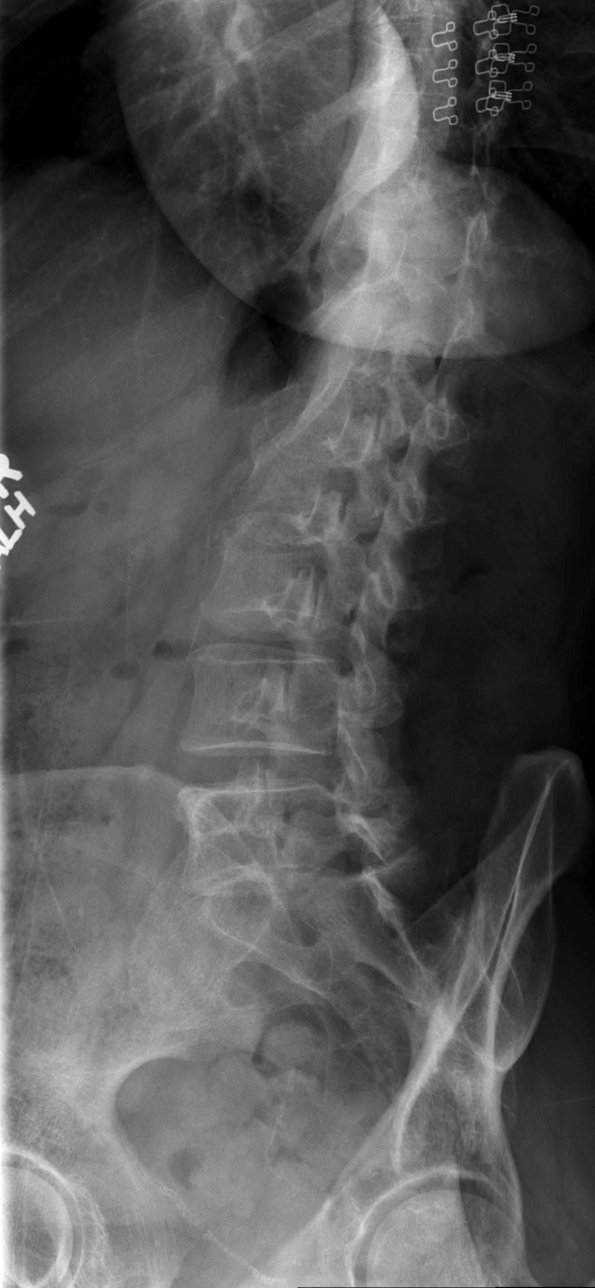

[w lumbar spine obl (2 of 2)]
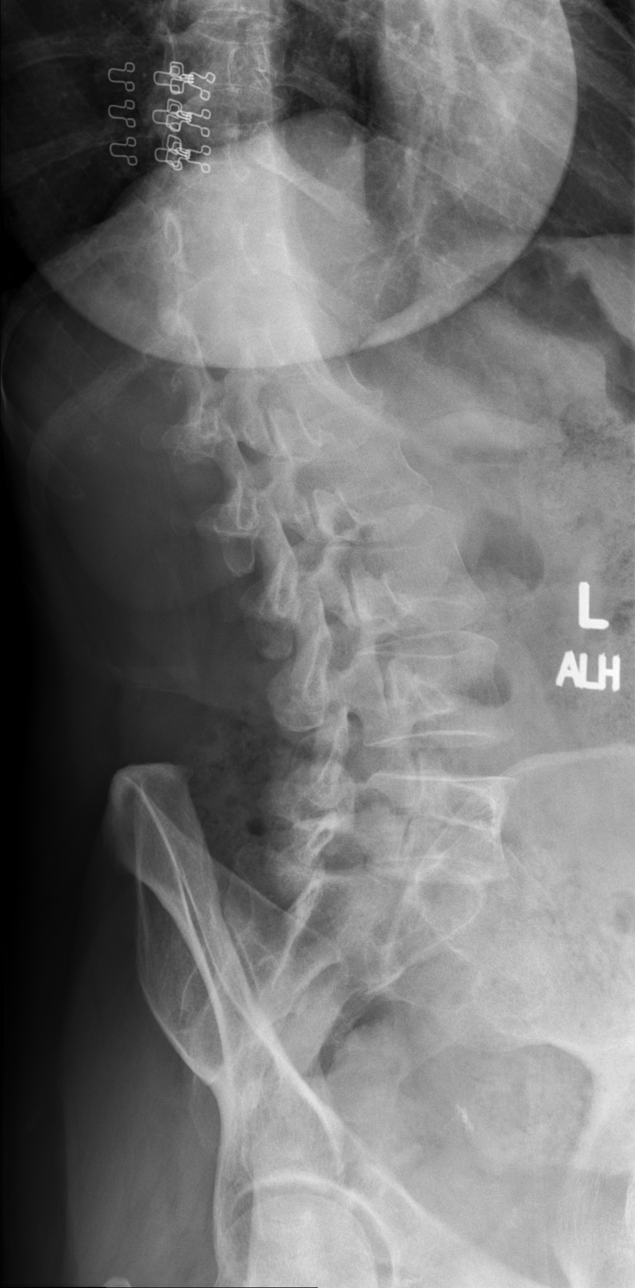

[w lumbar spine lat]
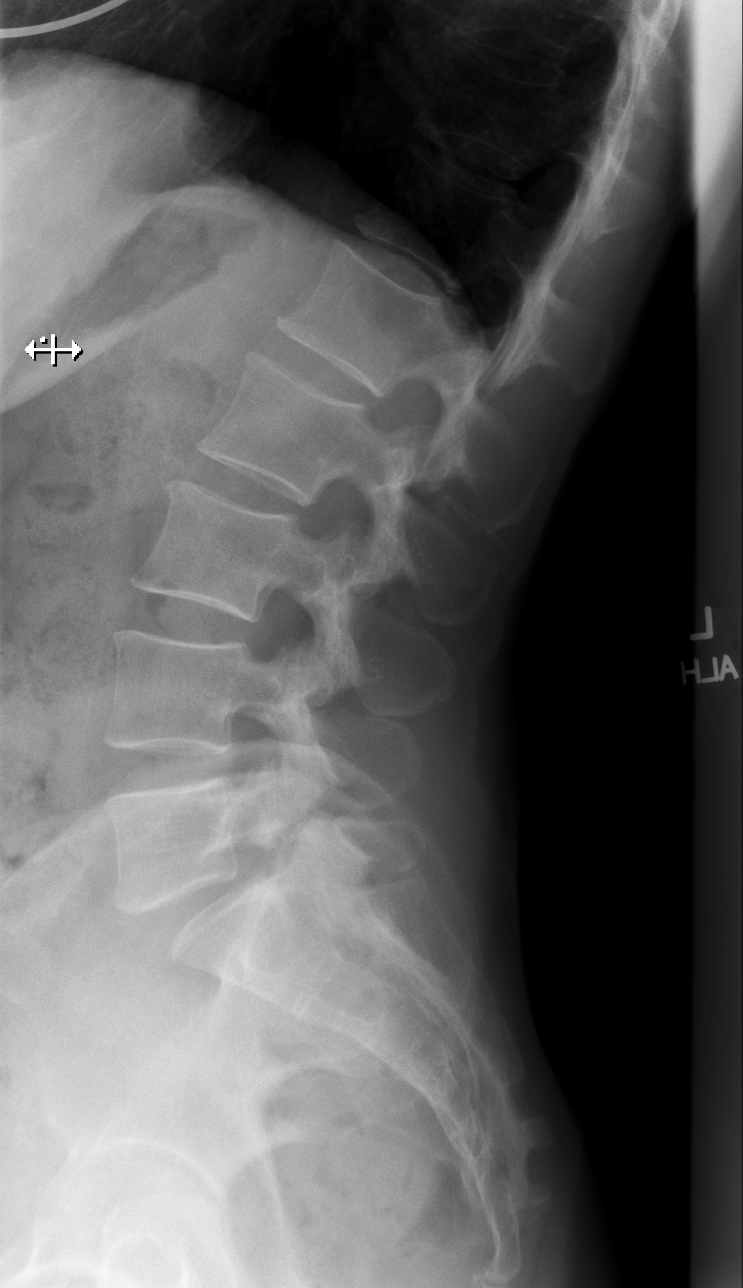

[w lumbar l-5 s-1 spot]
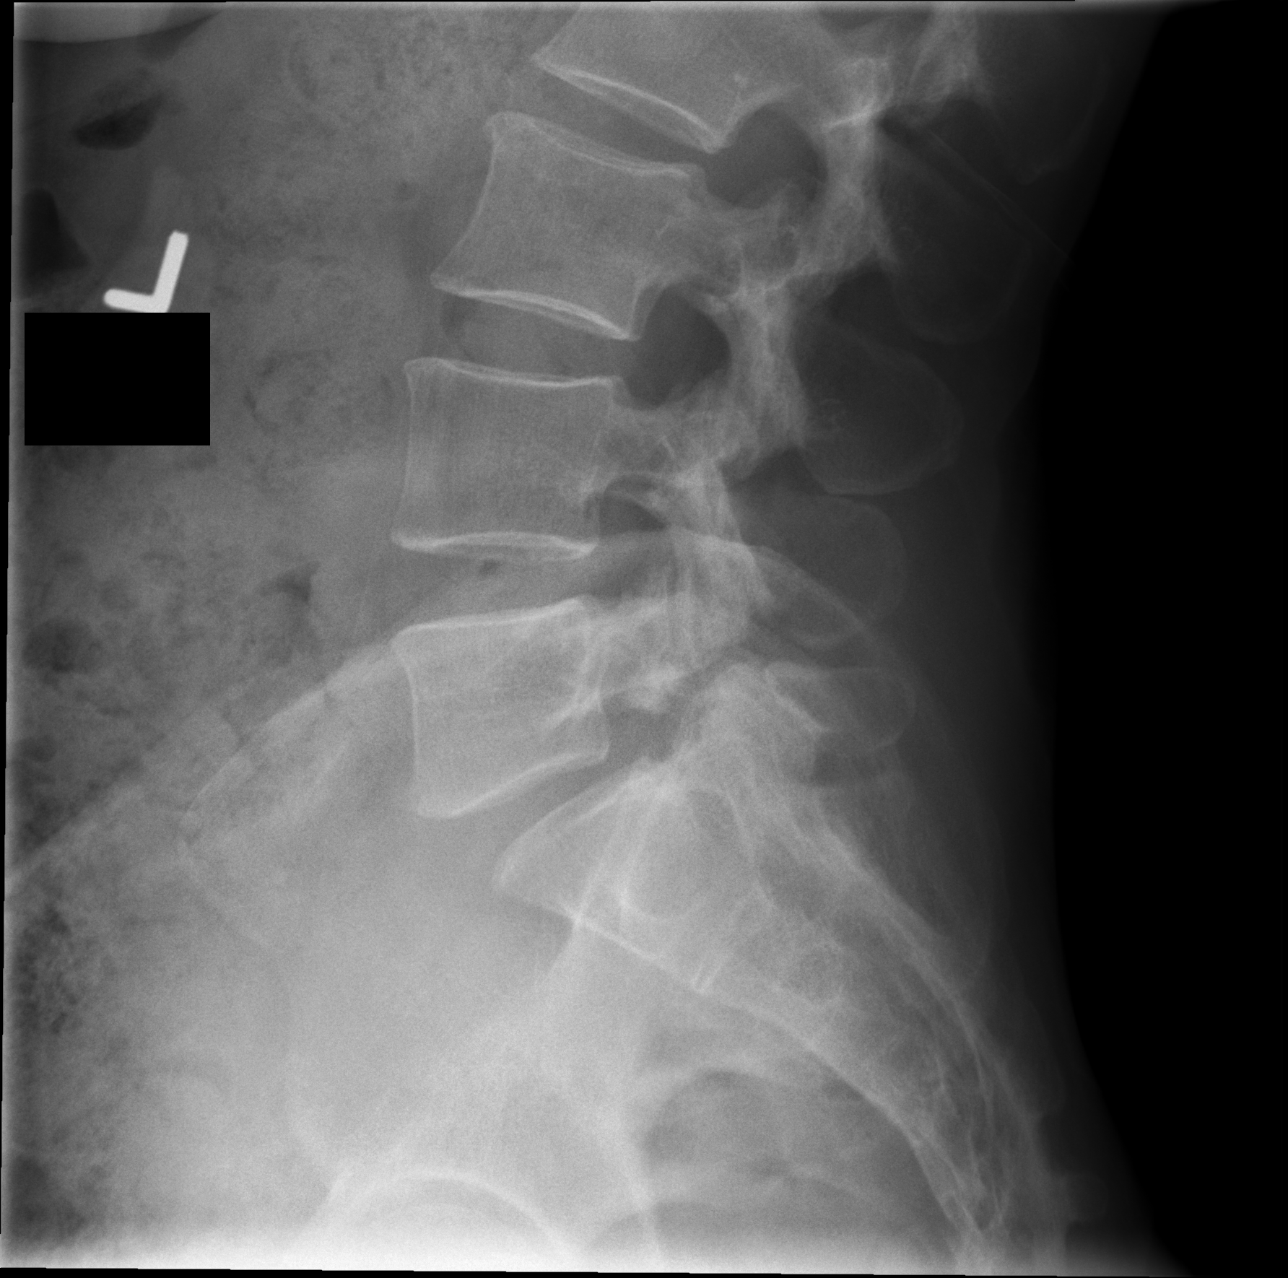

[5 of 5 positions shown; findings below may reference images not displayed]

FINDINGS: Bilateral L5 pars defects are present with grade 1 anterolisthesis
of L5 upon S1. Anterolisthesis has mildly progressed since prior CT
examination, now 7 mm. No acute fracture of the lumbar spine.
Vertebral body height has been preserved. Intervertebral disc height
is preserved. The paraspinal soft tissues are unremarkable.
IMPRESSION: Bilateral L5 pars defects with progressive, grade 1 anterolisthesis
of L5 upon S1.

## 2023-03-24 ENCOUNTER — Encounter: Payer: Self-pay | Admitting: Nurse Practitioner

## 2023-03-24 MED ORDER — AMPHETAMINE-DEXTROAMPHET ER 15 MG PO CP24: ORAL_CAPSULE | ORAL | 0 refills | Status: DC

## 2023-04-08 ENCOUNTER — Other Ambulatory Visit: Payer: Self-pay | Admitting: Nurse Practitioner

## 2023-04-08 ENCOUNTER — Encounter: Payer: Self-pay | Admitting: Nurse Practitioner

## 2023-04-29 ENCOUNTER — Encounter: Payer: Self-pay | Admitting: Nurse Practitioner

## 2023-04-29 ENCOUNTER — Ambulatory Visit (INDEPENDENT_AMBULATORY_CARE_PROVIDER_SITE_OTHER): Payer: Medicare Other | Admitting: Nurse Practitioner

## 2023-04-29 VITALS — BP 120/66 | HR 77 | Temp 97.5°F | Ht 59.5 in | Wt 107.6 lb

## 2023-04-29 DIAGNOSIS — M8580 Other specified disorders of bone density and structure, unspecified site: Secondary | ICD-10-CM | POA: Diagnosis not present

## 2023-04-29 DIAGNOSIS — J320 Chronic maxillary sinusitis: Secondary | ICD-10-CM | POA: Diagnosis not present

## 2023-04-29 DIAGNOSIS — Z0001 Encounter for general adult medical examination with abnormal findings: Secondary | ICD-10-CM

## 2023-04-29 DIAGNOSIS — I7300 Raynaud's syndrome without gangrene: Secondary | ICD-10-CM

## 2023-04-29 DIAGNOSIS — F3341 Major depressive disorder, recurrent, in partial remission: Secondary | ICD-10-CM

## 2023-04-29 DIAGNOSIS — R7309 Other abnormal glucose: Secondary | ICD-10-CM

## 2023-04-29 DIAGNOSIS — K581 Irritable bowel syndrome with constipation: Secondary | ICD-10-CM

## 2023-04-29 DIAGNOSIS — R6889 Other general symptoms and signs: Secondary | ICD-10-CM

## 2023-04-29 DIAGNOSIS — Z79899 Other long term (current) drug therapy: Secondary | ICD-10-CM

## 2023-04-29 DIAGNOSIS — Z0000 Encounter for general adult medical examination without abnormal findings: Secondary | ICD-10-CM

## 2023-04-29 DIAGNOSIS — G43909 Migraine, unspecified, not intractable, without status migrainosus: Secondary | ICD-10-CM

## 2023-04-29 DIAGNOSIS — J45909 Unspecified asthma, uncomplicated: Secondary | ICD-10-CM

## 2023-04-29 DIAGNOSIS — R0989 Other specified symptoms and signs involving the circulatory and respiratory systems: Secondary | ICD-10-CM

## 2023-04-29 MED ORDER — AZITHROMYCIN 250 MG PO TABS: ORAL_TABLET | ORAL | 1 refills | Status: DC

## 2023-04-29 NOTE — Patient Instructions (Addendum)

## 2023-04-29 NOTE — Progress Notes (Signed)
MEDICARE WELLNESS AND FOLLOW UP  Assessment:   Annual Medicare Wellness Visit Due annually  Health maintenance reviewed Healthily lifestyle goals set  Labile hypertension Well controlled off of meds at this time Monitor blood pressure at home; call if consistently over 130/80 Continue DASH diet, lifestyle discussed  Reminder to go to the ER if any CP, SOB, nausea, dizziness, severe HA, changes vision/speech, left arm numbness and tingling and jaw pain.  Other abnormal glucose/hx of prediabetes Recent A1Cs at goal Discussed diet/exercise, weight management  Defer A1C; check CMP  Osteopenia Pursue a combination of weight-bearing exercises and strength training. Patients with severe mobility impairment should be referred for physical therapy. Advised on fall prevention measures including proper lighting in all rooms, removal of area rugs and floor clutter, use of walking devices as deemed appropriate, avoidance of uneven walking surfaces. Smoking cessation and moderate alcohol consumption if applicable Consume 800 to 1000 IU of vitamin D daily with a goal vitamin D serum value of 30 ng/mL or higher. Aim for 1000 to 1200 mg of elemental calcium daily through supplements and/or dietary sources.  Chronic maxillary sinusitis Monitor; continue daily allergy pill, saline nasal irrigations, nasal steroids PRN Start Z-Pak given length of symptoms of 3 weeks.   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 Evelene Croon is managing, hx of trauma Continue medications; declines any changes  Lifestyle discussed: diet/exerise, sleep hygiene, stress management, hydration No SI/HI  IBS (irritable bowel syndrome)-constipation type Discussed lifestyle;  Suggested prebiotic fiber supplement;  Continue pre/probiotic Stay well hydrated.  Asthma, chronic, unspecified asthma severity, uncomplicated Continue  medications;   Migraines Recently improved; has seen neuro; rare eletriptan  Raynaud's  Didn't tolerate nifedipine, labile low BPs She reports doing well with lifestyle changes  Medication management All medications discussed and reviewed in full. All questions and concerns regarding medications addressed.    Orders Placed This Encounter  Procedures   CBC with Differential/Platelet   COMPLETE METABOLIC PANEL WITH GFR   Hemoglobin A1c    Notify office for further evaluation and treatment, questions or concerns if any reported s/s fail to improve.   The patient was advised to call back or seek an in-person evaluation if any symptoms worsen or if the condition fails to improve as anticipated.   Further disposition pending results of labs. Discussed med's effects and SE's.    I discussed the assessment and treatment plan with the patient. The patient was provided an opportunity to ask questions and all were answered. The patient agreed with the plan and demonstrated an understanding of the instructions.  Discussed med's effects and SE's. Screening labs and tests as requested with regular follow-up as recommended.  I provided 40 minutes of face-to-face time during this encounter including counseling, chart review, and critical decision making was preformed.  Today's Plan of Care is based on a patient-centered health care approach known as shared decision making - the decisions, tests and treatments allow for patient preferences and values to be balanced with clinical evidence.     Future Appointments  Date Time Provider Department Center  08/03/2023 10:00 AM [name], NP [facility] None  11/02/2023 10:30 AM [name], MD [facility] None  04/28/2024 10:30 AM [name], NP [facility] None     Plan:   During the course of the visit the patient was educated and counseled about appropriate screening and preventive services including:   Pneumococcal vaccine   Prevnar  13 Influenza vaccine Td vaccine Screening electrocardiogram Bone densitometry screening Colorectal cancer screening Diabetes screening Glaucoma screening Nutrition counseling  Advanced directives: requested   Subjective:  Alexis Weaver is a 72 y.o. female who presents for AWV and follow up. She has Palpitations; Asthma, chronic; Chronic sinusitis; Chronic anxiety; Major depression in partial remission (HCC); Osteopenia; Abnormal glucose; Medication management; IBS (irritable bowel syndrome)-constipation type; Vitamin D deficiency; Labile hypertension; Migraines; Raynaud's phenomenon without gangrene; Raynaud disease; Hyperlipidemia, mixed; and Anterolisthesis of lumbar spine on their problem list.   She has tender pressure points, had negative ANA, AntiDNA, RF.  Has increase in pain in distal phalangeal joint of right hand 2nd digit.    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   She reports long history of migraines; has seen neuro; much improved since retiring; she does have abortive relpax which works well.   She was seeing a therapist and Dr. Evelene Croon for chronic depression following trauma in childhood, son with TBI/addiction, taking trazodone for sleep, has been taking adderall XR 10 mg daily. "I'm doing as well as can be expected."  Has seen Dr. Ezzard Standing and Lochearn Callas for allergies/asthma but shots didn't help.  She is on allegra, and prednisone intermittent, reports has had persistent sinus tenderness x 3 weeks not improving.   On linzess for IBS-C, on 145 mg with sennokot with some initial improvement. She had normal colonoscopy by Dr. Russella Dar in 2017 with recall of 10 years.   She has reported episodic red/blue/white discoloration and coolness of extremities and underwent LE vascular US which was unremarkable; felt to be Raynaud's, was prescribed procardia but was unable to tolerate low BP, prefers managing with lifestyle.  BMI is Body mass index  is 21.37 kg/m., she has been working on diet and exercise, exercises 60+ min at least 5 days a week.  Wt Readings from Last 3 Encounters:  04/29/23 107 lb 9.6 oz (48.8 kg)  01/20/23 103 lb 3.2 oz (46.8 kg)  07/22/22 104 lb 12.8 oz (47.5 kg)    Her blood pressure has been controlled at home (labile, ranges 110/60-rare low 140s/80s), today their BP is BP: 120/66 She does workout. She denies chest pain, shortness of breath, dizziness.    She is not on cholesterol medication and denies myalgias. Her cholesterol is at goal. The cholesterol last visit was:   Lab Results  Component Value Date   CHOL 181 01/20/2023   HDL 83 01/20/2023   LDLCALC 80 01/20/2023   TRIG 95 01/20/2023   CHOLHDL 2.2 01/20/2023   She has hx of prediabetes (A1C 6.0 in 2016, 5.8 in 2020). She has been working on lifestyle for glucose management. Last A1C:  Lab Results  Component Value Date   HGBA1C 5.7 (H) 01/20/2023   Lab Results  Component Value Date   GFRNONAA 64 10/07/2020   Patient is on Vitamin D supplement, did reduce from 10000 IU to 5000 IU   Lab Results  Component Value Date   VD25OH 95 01/20/2023       Medication Review: Current Outpatient Medications on File Prior to Visit  Medication Sig Dispense Refill   amphetamine-dextroamphetamine (ADDERALL XR) 15 MG 24 hr capsule Take one capsule (15 mg) two times a day. 60 capsule 0   bimatoprost (LATISSE) 0.03 % ophthalmic solution PLACE 1 DROP ON APPLICATOR AND APPLY EVENLY ALONG SKIN OF UPPLER EYELID AT BASE OF EYELASHES ONCE AT BEDTIME 5 mL 99   Cholecalciferol (VITAMIN D3)  125 MCG (5000 UT) CAPS Take 1 cap 4-5 days per week     cycloSPORINE (RESTASIS) 0.05 % ophthalmic emulsion Place 1 drop into both eyes 2 (two) times daily.     dexamethasone (DECADRON) 1 MG tablet Take 1 tablet  3 x /day as directed for pain  / Inflammation. 50 tablet 0   Efinaconazole (JUBLIA) 10 % SOLN Apply topically daily.     eletriptan (RELPAX) 40 MG tablet Take 1 tablet for  Migraine & may repeat 1 x in 2 hours (Maximum 2 tabs /24 hours) 6 tablet 5   estradiol (VIVELLE-DOT) 0.025 MG/24HR PLACE 1 PATCH ONTO THE SKIN 2 TIMES A WEEK. 24 patch 3   Fluorouracil (TOLAK EX) Apply topically.     linaclotide (LINZESS) 290 MCG CAPS capsule Take 1 capsule Daily for IBS - C                                                         /                             TAKE                                   BY                                MOUTH 90 capsule 3   meloxicam (MOBIC) 15 MG tablet Take 1/2 to 1 tablet Daily with Food  for Pain & Inflammation 30 tablet 0   Milnacipran HCl (SAVELLA) 100 MG TABS tablet Take  2 tablets ( 200 mg) Daily  for  Mood 180 tablet 3   montelukast (SINGULAIR) 10 MG tablet Take 1 tablet daily for Allergies & Asthma 90 tablet 3   ondansetron (ZOFRAN) 8 MG tablet Take1 tablet 2 to 3 x /day as needed for Nausea / Emesis 100 tablet 0   OVER THE COUNTER MEDICATION Apple Cider Vinegar-Take daily     OVER THE COUNTER MEDICATION 500 mg. Vitamin C daily.     OVER THE COUNTER MEDICATION Cinnamon daily     OVER THE COUNTER MEDICATION Zinc 50 mg daily     Sennosides (SENOKOT PO) Take 1 tablet by mouth as needed.      traZODone (DESYREL) 50 MG tablet Take  1 to 3 tabs  1 to 2 hours  before Bedtime  as needed for Sleep 270 tablet 1   triamterene-hydrochlorothiazide (MAXZIDE) 75-50 MG tablet ^Take  1 tablet  Daily for BP & Fluid Retention                                                         /  TAKE                            BY                                MOUTH 90 tablet 3   No current facility-administered medications on file prior to visit.    Allergies  Allergen Reactions   Hydrocodone-Acetaminophen Shortness Of Breath   Lamisil [Terbinafine] Rash   Codeine Hives   Morphine And Codeine Hives   Nitrofurantoin Other (See Comments)    Makes hands and feet burn   Tetracycline     Current Problems  (verified) Patient Active Problem List   Diagnosis Date Noted   Anterolisthesis of lumbar spine 06/03/2021   Hyperlipidemia, mixed 10/06/2020   Migraines 03/22/2020   Raynaud's phenomenon without gangrene 03/22/2020   Raynaud disease 03/22/2020   Labile hypertension 07/07/2017   Abnormal glucose 06/26/2014   Medication management 06/26/2014   IBS (irritable bowel syndrome)-constipation type 06/26/2014   Vitamin D deficiency 06/26/2014   Asthma, chronic    Chronic sinusitis    Chronic anxiety    Major depression in partial remission (HCC)    Osteopenia    Palpitations 04/01/2010    Screening Tests Immunization History  Administered Date(s) Administered   DT (Pediatric) 11/06/2004   Influenza, High Dose Seasonal PF 03/23/2019, 03/22/2020, 05/27/2021   Influenza-Unspecified 02/14/2012   PFIZER(Purple Top)SARS-COV-2 Vaccination 10/12/2019, 11/06/2019, 06/01/2020   Pneumococcal Conjugate-13 10/26/2017   Pneumococcal Polysaccharide-23 03/23/2019   Tdap 02/26/2011    Tetanus: 2012 - patient declines today  Pneumovax: 2020 Prevnar 13: 10/2017 Flu vaccine: 2021 - declines Shingrix: check with insurance  Covid 19: 2/2, 2021, pfizer + booster  Pap: 2016, declines another, never abnormal pap  MGM: 06/2022 [employer]  DEXA: 06/2022  pending report last 2022 -2.2  Colonoscopy: 2017  Dr. Russella Dar 10 - years EGD: 10/2015  Names of Other Physician/Practitioners you currently use: 1. [facility] here for primary care 2. Dr. Emily Filbert, eye doctor, last visit 2024 has upcoming in 06/2021 3. Dr. Fara Olden, dentist, last visit 2024, goes q73m  4. Dr. Terri Piedra, derm, last 2024  Patient Care Team: [name], MD as PCP - General (Internal Medicine) Sidney Ace, MD as Referring Physician (Allergy) Marcene Corning, MD as Consulting Physician (Orthopedic Surgery)  SURGICAL HISTORY She  has a past surgical history that includes Abdominal hysterectomy; Cesarean  section (1979, 1983); Breast enhancement surgery; Laparoscopic abdominal exploration; Hemorrhoid banding; and Appendectomy (1997). FAMILY HISTORY Her family history includes Asthma in her brother; COPD in her brother; Diabetes in her brother and father; Diverticulitis in her maternal grandmother; Hyperlipidemia in her brother; Hypertension in her father and mother; Rheum arthritis in her sister. SOCIAL HISTORY She  reports that she has never smoked. She has never used smokeless tobacco. She reports that she does not drink alcohol and does not use drugs.   Review of Systems  Constitutional:  Negative for chills, diaphoresis, fever, malaise/fatigue and weight loss.  HENT:  Negative for congestion, ear discharge, ear pain, hearing loss, nosebleeds, sore throat and tinnitus.   Eyes: Negative.   Respiratory:  Negative for cough, hemoptysis, sputum production, shortness of breath, wheezing and stridor.   Cardiovascular: Negative.   Gastrointestinal:  Positive for constipation. Negative for abdominal pain, blood in stool, diarrhea, heartburn, melena, nausea and vomiting.  Genitourinary:  Negative for dysuria,  flank pain, frequency, hematuria and urgency.  Musculoskeletal:  Negative for back pain, falls, joint pain, myalgias and neck pain.  Skin: Negative.   Neurological: Negative.  Negative for weakness and headaches.  Psychiatric/Behavioral:  Positive for depression. Negative for hallucinations, memory loss, substance abuse and suicidal ideas. The patient is not nervous/anxious.      Objective:     Today's Vitals   04/29/23 1042  BP: 120/66  Pulse: 77  Temp: (!) 97.5 F (36.4 C)  SpO2: 98%  Weight: 107 lb 9.6 oz (48.8 kg)  Height: 4' 11.5" (1.511 m)    Body mass index is 21.37 kg/m.  General appearance: alert, no distress, WD/WN, female HEENT: normocephalic, sclerae anicteric, TMs pearly, nares patent, no discharge or erythema, pharynx normal Oral cavity: MMM, no lesions Neck:  supple, no lymphadenopathy, no thyromegaly, no masses Heart: RRR, normal S1, S2, no murmurs Lungs: CTA bilaterally, no wheezes, rhonchi, or rales Breasts: Recent normal mammogram, doing self exams, declines today  Abdomen: +bs, soft, non tender, non distended, no masses, no hepatomegaly, no splenomegaly Musculoskeletal: nontender, no swelling, no obvious deformity. Extremities: no edema, no cyanosis, no clubbing Pulses: 2+ symmetric, upper and lower extremities, normal cap refill Neurological: alert, oriented x 3, CN2-12 intact, strength normal upper extremities and lower extremities, sensation normal throughout, DTRs 2+ throughout, no cerebellar signs, gait normal Psychiatric: normal affect, behavior normal, pleasant  GU: defer   Medicare Attestation I have personally reviewed: The patient's medical and social history Their use of alcohol, tobacco or illicit drugs Their current medications and supplements The patient's functional ability including ADLs,fall risks, home safety risks, cognitive, and hearing and visual impairment Diet and physical activities Evidence for depression or mood disorders  The patient's weight, height, BMI, and visual acuity have been recorded in the chart.  I have made referrals, counseling, and provided education to the patient based on review of the above and I have provided the patient with a written personalized care plan for preventive services.     [name], NP   04/29/2023

## 2023-04-30 LAB — CBC WITH DIFFERENTIAL/PLATELET
Absolute Lymphocytes: 1913 {cells}/uL (ref 850–3900)
Absolute Monocytes: 403 {cells}/uL (ref 200–950)
Basophils Absolute: 61 {cells}/uL (ref 0–200)
Basophils Relative: 1.2 %
Eosinophils Absolute: 102 {cells}/uL (ref 15–500)
Eosinophils Relative: 2 %
HCT: 43.6 % (ref 35.0–45.0)
Hemoglobin: 14.7 g/dL (ref 11.7–15.5)
MCH: 32 pg (ref 27.0–33.0)
MCHC: 33.7 g/dL (ref 32.0–36.0)
MCV: 94.8 fL (ref 80.0–100.0)
MPV: 10.3 fL (ref 7.5–12.5)
Monocytes Relative: 7.9 %
Neutro Abs: 2621 {cells}/uL (ref 1500–7800)
Neutrophils Relative %: 51.4 %
Platelets: 280 10*3/uL (ref 140–400)
RBC: 4.6 10*6/uL (ref 3.80–5.10)
RDW: 11.6 % (ref 11.0–15.0)
Total Lymphocyte: 37.5 %
WBC: 5.1 10*3/uL (ref 3.8–10.8)

## 2023-04-30 LAB — COMPLETE METABOLIC PANEL WITH GFR
AG Ratio: 1.8 (calc) (ref 1.0–2.5)
ALT: 15 U/L (ref 6–29)
AST: 21 U/L (ref 10–35)
Albumin: 4.4 g/dL (ref 3.6–5.1)
Alkaline phosphatase (APISO): 62 U/L (ref 37–153)
BUN: 18 mg/dL (ref 7–25)
CO2: 34 mmol/L — ABNORMAL HIGH (ref 20–32)
Calcium: 9.8 mg/dL (ref 8.6–10.4)
Chloride: 98 mmol/L (ref 98–110)
Creat: 0.76 mg/dL (ref 0.60–1.00)
Globulin: 2.4 g/dL (ref 1.9–3.7)
Glucose, Bld: 87 mg/dL (ref 65–99)
Potassium: 4 mmol/L (ref 3.5–5.3)
Sodium: 138 mmol/L (ref 135–146)
Total Bilirubin: 0.6 mg/dL (ref 0.2–1.2)
Total Protein: 6.8 g/dL (ref 6.1–8.1)
eGFR: 84 mL/min/{1.73_m2} (ref >=60)

## 2023-04-30 LAB — HEMOGLOBIN A1C
Hgb A1c MFr Bld: 5.7 %{Hb} — ABNORMAL HIGH (ref <5.7)
Mean Plasma Glucose: 117 mg/dL
eAG (mmol/L): 6.5 mmol/L

## 2023-05-05 ENCOUNTER — Encounter: Payer: Self-pay | Admitting: Internal Medicine

## 2023-05-16 ENCOUNTER — Other Ambulatory Visit: Payer: Self-pay | Admitting: Nurse Practitioner

## 2023-05-17 MED ORDER — AMPHETAMINE-DEXTROAMPHET ER 15 MG PO CP24: ORAL_CAPSULE | ORAL | 0 refills | Status: DC

## 2023-06-19 ENCOUNTER — Other Ambulatory Visit: Payer: Self-pay | Admitting: Internal Medicine

## 2023-06-19 DIAGNOSIS — F5101 Primary insomnia: Secondary | ICD-10-CM

## 2023-06-19 DIAGNOSIS — R0989 Other specified symptoms and signs involving the circulatory and respiratory systems: Secondary | ICD-10-CM

## 2023-06-22 ENCOUNTER — Other Ambulatory Visit: Payer: Self-pay | Admitting: Nurse Practitioner

## 2023-06-22 DIAGNOSIS — K581 Irritable bowel syndrome with constipation: Secondary | ICD-10-CM

## 2023-06-25 ENCOUNTER — Encounter: Payer: Medicare Other | Admitting: Nurse Practitioner

## 2023-07-12 ENCOUNTER — Other Ambulatory Visit: Payer: Self-pay | Admitting: Internal Medicine

## 2023-07-12 DIAGNOSIS — J45909 Unspecified asthma, uncomplicated: Secondary | ICD-10-CM

## 2023-07-15 ENCOUNTER — Encounter: Payer: Self-pay | Admitting: Nurse Practitioner

## 2023-07-19 MED ORDER — AMPHETAMINE-DEXTROAMPHET ER 15 MG PO CP24: ORAL_CAPSULE | ORAL | 0 refills | Status: DC

## 2023-07-26 ENCOUNTER — Encounter: Payer: Medicare Other | Admitting: Nurse Practitioner

## 2023-08-03 ENCOUNTER — Encounter: Payer: Medicare Other | Admitting: Nurse Practitioner

## 2023-08-04 ENCOUNTER — Other Ambulatory Visit: Payer: Self-pay

## 2023-08-05 MED ORDER — ONDANSETRON HCL 8 MG PO TABS: ORAL_TABLET | ORAL | 0 refills | Status: AC

## 2023-08-05 MED ORDER — ELETRIPTAN HYDROBROMIDE 40 MG PO TABS: ORAL_TABLET | ORAL | 5 refills | Status: AC

## 2023-10-07 ENCOUNTER — Encounter: Payer: Self-pay | Admitting: Internal Medicine

## 2023-10-07 ENCOUNTER — Ambulatory Visit (INDEPENDENT_AMBULATORY_CARE_PROVIDER_SITE_OTHER): Payer: Medicare Other | Admitting: Internal Medicine

## 2023-10-07 VITALS — BP 148/80 | HR 95 | Temp 97.8°F | Ht 59.5 in | Wt 104.2 lb

## 2023-10-07 DIAGNOSIS — F3341 Major depressive disorder, recurrent, in partial remission: Secondary | ICD-10-CM

## 2023-10-07 DIAGNOSIS — G43909 Migraine, unspecified, not intractable, without status migrainosus: Secondary | ICD-10-CM

## 2023-10-07 DIAGNOSIS — F419 Anxiety disorder, unspecified: Secondary | ICD-10-CM | POA: Diagnosis not present

## 2023-10-07 DIAGNOSIS — R0989 Other specified symptoms and signs involving the circulatory and respiratory systems: Secondary | ICD-10-CM

## 2023-10-07 DIAGNOSIS — E782 Mixed hyperlipidemia: Secondary | ICD-10-CM | POA: Diagnosis not present

## 2023-10-07 DIAGNOSIS — J45909 Unspecified asthma, uncomplicated: Secondary | ICD-10-CM

## 2023-10-07 MED ORDER — DEXAMETHASONE 1 MG PO TABS: ORAL_TABLET | ORAL | 0 refills | Status: DC

## 2023-10-07 NOTE — Progress Notes (Signed)
 New Patient Office Visit     CC/Reason for Visit: Establish care, discuss chronic conditions, medication refills Previous PCP: Vangie Genet, MD Last Visit: November/2024  HPI: Alexis Weaver is a 73 y.o. female who is coming in today for the above mentioned reasons. Past Medical History is significant for: Labile hypertension not on medication, migraine headaches, vitamin D  deficiency, depression and anxiety followed by psychiatry and vitamin D  deficiency.  Her past surgical history significant for right rotator cuff repair, C-section x 2, appendectomy and hysterectomy as well as breast lift with implant.  Her last annual wellness visit was in November 2024.   Past Medical/Surgical History: Past Medical History:  Diagnosis Date   Allergy    Anemia 2016   Anxiety    Asthma    Chronic headaches    Chronic sinusitis    Depression    Fibromyalgia    GERD (gastroesophageal reflux disease)    IBS (irritable bowel syndrome)    Meckel diverticulum 1997   Meckel's diverticulum    Migraines 03/22/2020   Neuromuscular disorder (HCC)    fibromyagia   Osteopenia     Past Surgical History:  Procedure Laterality Date   ABDOMINAL HYSTERECTOMY     tubes/ovaries spared   APPENDECTOMY  1997   BREAST ENHANCEMENT SURGERY     CESAREAN SECTION  1979, 1983   x 2   HEMORRHOID BANDING     LAPAROSCOPIC ABDOMINAL EXPLORATION     peritonitis, appendectomy    Social History:  reports that she has never smoked. She has never used smokeless tobacco. She reports that she does not drink alcohol and does not use drugs.  Allergies: Allergies  Allergen Reactions   Hydrocodone-Acetaminophen Shortness Of Breath   Lamisil  [Terbinafine ] Rash   Codeine Hives   Morphine And Codeine Hives   Nitrofurantoin Other (See Comments)    Makes hands and feet burn   Tetracycline     Family History:  Family History  Problem Relation Age of Onset   Diabetes Father    Hypertension Father     Diabetes Brother    COPD Brother    Asthma Brother    Hyperlipidemia Brother    Hypertension Mother    Diverticulitis Maternal Grandmother    Rheum arthritis Sister    Colon cancer Neg Hx      Current Outpatient Medications:    amphetamine -dextroamphetamine (ADDERALL XR) 15 MG 24 hr capsule, Take one capsule (15 mg) two times a day., Disp: 60 capsule, Rfl: 0   bimatoprost  (LATISSE ) 0.03 % ophthalmic solution, PLACE 1 DROP ON APPLICATOR AND APPLY EVENLY ALONG SKIN OF UPPLER EYELID AT BASE OF EYELASHES ONCE AT BEDTIME, Disp: 5 mL, Rfl: 99   Cholecalciferol (VITAMIN D3) 125 MCG (5000 UT) CAPS, Take 1 cap 4-5 days per week, Disp: , Rfl:    cycloSPORINE (RESTASIS) 0.05 % ophthalmic emulsion, Place 1 drop into both eyes 2 (two) times daily., Disp: , Rfl:    Efinaconazole (JUBLIA) 10 % SOLN, Apply topically daily., Disp: , Rfl:    eletriptan  (RELPAX ) 40 MG tablet, Take 1 tablet for Migraine & may repeat 1 x in 2 hours (Maximum 2 tabs /24 hours), Disp: 6 tablet, Rfl: 5   estradiol  (VIVELLE -DOT) 0.025 MG/24HR, PLACE 1 PATCH ONTO THE SKIN 2 TIMES A WEEK., Disp: 24 patch, Rfl: 3   Fluorouracil (TOLAK EX), Apply topically., Disp: , Rfl:    LINZESS  290 MCG CAPS capsule, TAKE 1 CAPSULE BY MOUTH EVERY DAY FOR IBS, Disp:  90 capsule, Rfl: 3   meloxicam  (MOBIC ) 15 MG tablet, Take 1/2 to 1 tablet Daily with Food  for Pain & Inflammation, Disp: 30 tablet, Rfl: 0   Milnacipran  HCl (SAVELLA ) 100 MG TABS tablet, Take  2 tablets ( 200 mg) Daily  for  Mood, Disp: 180 tablet, Rfl: 3   ondansetron  (ZOFRAN ) 8 MG tablet, Take1 tablet 2 to 3 x /day as needed for Nausea / Emesis, Disp: 100 tablet, Rfl: 0   OVER THE COUNTER MEDICATION, Apple Cider Vinegar-Take daily, Disp: , Rfl:    OVER THE COUNTER MEDICATION, 500 mg. Vitamin C daily., Disp: , Rfl:    OVER THE COUNTER MEDICATION, Cinnamon daily, Disp: , Rfl:    OVER THE COUNTER MEDICATION, Zinc 50 mg daily, Disp: , Rfl:    Sennosides (SENOKOT PO), Take 1 tablet by  mouth as needed. , Disp: , Rfl:    traZODone  (DESYREL ) 50 MG tablet, TAKE 1 TO 3 TABS 1 TO 2 HOURS BEFORE BEDTIME AS NEEDED FOR SLEEP, Disp: 270 tablet, Rfl: 1   triamterene -hydrochlorothiazide (MAXZIDE) 75-50 MG tablet, TAKE 1 TABLET BY MOUTH EVERY DAY FOR BP AND FLUID RETENTION, Disp: 90 tablet, Rfl: 3   dexamethasone  (DECADRON ) 1 MG tablet, Take 1 tablet  3 x /day as directed for pain  / Inflammation., Disp: 50 tablet, Rfl: 0  Review of Systems:  Negative except as indicated in HPI.   Physical Exam: Vitals:   10/07/23 1256  BP: (!) 148/80  Pulse: 95  Temp: 97.8 F (36.6 C)  TempSrc: Oral  SpO2: 99%  Weight: 104 lb 3.2 oz (47.3 kg)  Height: 4' 11.5" (1.511 m)  HC: 40" (101.6 cm)   Body mass index is 20.69 kg/m.  Physical Exam Vitals reviewed.  Constitutional:      Appearance: Normal appearance.  HENT:     Head: Normocephalic and atraumatic.  Eyes:     Conjunctiva/sclera: Conjunctivae normal.  Cardiovascular:     Rate and Rhythm: Normal rate and regular rhythm.  Pulmonary:     Effort: Pulmonary effort is normal.     Breath sounds: Normal breath sounds.  Skin:    General: Skin is warm and dry.  Neurological:     General: No focal deficit present.     Mental Status: She is alert and oriented to person, place, and time.  Psychiatric:        Mood and Affect: Mood normal.        Behavior: Behavior normal.        Thought Content: Thought content normal.        Judgment: Judgment normal.      Impression and Plan:  Chronic anxiety  Recurrent major depressive disorder, in partial remission (HCC)  Labile hypertension  Hyperlipidemia, mixed  Migraine without status migrainosus, not intractable, unspecified migraine type  Chronic asthma without complication, unspecified asthma severity, unspecified whether persistent -     dexAMETHasone ; Take 1 tablet  3 x /day as directed for pain  / Inflammation.  Dispense: 50 tablet; Refill: 0   - Previous outpatient charts  reviewed in detail. - Blood pressure is noted to be elevated in office today.  She will do blood pressure monitoring and advise of any blood pressures consistently above 130/80. - Dexamethasone  refilled that she takes as needed for allergies and asthma.  50 tablets usually lasts her 2 to 3 years.  Time spent: 46 minutes reviewing chart, interviewing and examining patient and formulating plan of care.  Marguerita Shih, MD [facility]

## 2023-11-02 ENCOUNTER — Ambulatory Visit: Payer: Medicare Other | Admitting: Internal Medicine

## 2024-01-18 ENCOUNTER — Other Ambulatory Visit: Payer: Self-pay | Admitting: Internal Medicine

## 2024-01-18 DIAGNOSIS — F3341 Major depressive disorder, recurrent, in partial remission: Secondary | ICD-10-CM

## 2024-01-18 MED ORDER — SAVELLA 100 MG PO TABS: ORAL_TABLET | ORAL | 3 refills | Status: AC

## 2024-01-18 NOTE — Telephone Encounter (Signed)
 Copied from CRM #[id number]. Topic: Clinical - Medication Refill >> Jan 18, 2024  1:10 PM Rosina D wrote: Medication: Milnacipran  HCl (SAVELLA ) 100 MG TABS tablet  Has the patient contacted their pharmacy? Yes (Agent: If no, request that the patient contact the pharmacy for the refill. If patient does not wish to contact the pharmacy document the reason why and proceed with request.) (Agent: If yes, when and what did the pharmacy advise?)  This is the patient's preferred pharmacy:  [id number] [address] [facility] - [address] [place] [address] Phone: [phone] Fax: [phone]  Is this the correct pharmacy for this prescription? Yes If no, delete pharmacy and type the correct one.   Has the prescription been filled recently? Yes  Is the patient out of the medication? Yes  Has the patient been seen for an appointment in the last year OR does the patient have an upcoming appointment? Yes  Can we respond through MyChart? Yes  Agent: Please be advised that Rx refills may take up to 3 business days. We ask that you follow-up with your pharmacy.

## 2024-02-22 LAB — HM MAMMOGRAPHY

## 2024-02-25 ENCOUNTER — Encounter: Payer: Self-pay | Admitting: Internal Medicine

## 2024-03-15 ENCOUNTER — Other Ambulatory Visit: Payer: Self-pay | Admitting: Internal Medicine

## 2024-03-15 NOTE — Telephone Encounter (Unsigned)
 Copied from CRM #[id number]. Topic: Clinical - Medication Refill >> Mar 15, 2024 12:04 PM Shereese L wrote: Medication: bimatoprost  (LATISSE ) 0.03 % ophthalmic solution  Has the patient contacted their pharmacy? Yes (Agent: If no, request that the patient contact the pharmacy for the refill. If patient does not wish to contact the pharmacy document the reason why and proceed with request.) (Agent: If yes, when and what did the pharmacy advise?)  This is the patient's preferred pharmacy:  [id number] [address] [facility] - [address] [place] [address] Phone: [phone] Fax: [phone]  Is this the correct pharmacy for this prescription? Yes If no, delete pharmacy and type the correct one.   Has the prescription been filled recently? Yes  Is the patient out of the medication? Yes  Has the patient been seen for an appointment in the last year OR does the patient have an upcoming appointment? Yes  Can we respond through MyChart? Yes  Agent: Please be advised that Rx refills may take up to 3 business days. We ask that you follow-up with your pharmacy.

## 2024-03-16 MED ORDER — BIMATOPROST 0.03 % EX SOLN: CUTANEOUS | 99 refills | Status: AC

## 2024-04-18 ENCOUNTER — Ambulatory Visit: Admitting: Family Medicine

## 2024-04-18 ENCOUNTER — Encounter: Payer: Self-pay | Admitting: Family Medicine

## 2024-04-18 DIAGNOSIS — Z0000 Encounter for general adult medical examination without abnormal findings: Secondary | ICD-10-CM

## 2024-04-18 NOTE — Patient Instructions (Addendum)
 I really enjoyed getting to talk with you today! I am available on Tuesdays and Thursdays for virtual visits if you have any questions or concerns, or if I can be of any further assistance.   CHECKLIST FROM ANNUAL WELLNESS VISIT:  -Follow up (please call to schedule if not scheduled after visit):   -yearly for annual wellness visit with primary care office  Here is a list of your preventive care/health maintenance measures and the plan for each if any are due:  PLAN For any measures below that may be due:    1. Can get vaccines at the pharmacy. Please let us  know if you do so that we can update your record.   Health Maintenance  Topic Date Due   COVID-19 Vaccine (4 - 2025-26 season) 05/04/2024 (Originally 02/14/2024)   Zoster Vaccines- Shingrix (1 of 2) 07/19/2024 (Originally 06/04/1970)   Influenza Vaccine  09/12/2024 (Originally 01/14/2024)   DTaP/Tdap/Td (3 - Td or Tdap) 04/18/2025 (Originally 02/25/2021)   Mammogram  02/21/2025   Medicare Annual Wellness (AWV)  04/18/2025   Colonoscopy  04/29/2026   Pneumococcal Vaccine: 50+ Years  Completed   DEXA SCAN  Completed   Hepatitis C Screening  Completed   Meningococcal B Vaccine  Aged Out    -See a dentist at least yearly  -Get your eyes checked and then per your eye specialist's recommendations  -Other issues addressed today:   -I have included below further information regarding a healthy whole foods based diet, physical activity guidelines for adults, stress management and opportunities for social connections. I hope you find this information useful.   -----------------------------------------------------------------------------------------------------------------------------------------------------------------------------------------------------------------------------------------------------------    NUTRITION: -eat real food: lots of colorful vegetables (half the plate) and fruits -5-7 servings of vegetables and fruits per  day (fresh or steamed is best), exp. 2 servings of vegetables with lunch and dinner and 2 servings of fruit per day. Berries and greens such as kale and collards are great choices.  -consume on a regular basis:  fresh fruits, fresh veggies, fish, nuts, seeds, healthy oils (such as olive oil, avocado oil), whole grains (make sure for bread/pasta/crackers/etc., that the first ingredient on label contains the word whole), legumes. -can eat small amounts of dairy and lean meat (no larger than the palm of your hand), but avoid processed meats such as ham, bacon, lunch meat, etc. -drink water -try to avoid fast food and pre-packaged foods, processed meat, ultra processed foods/beverages (donuts, candy, etc.) -most experts advise limiting sodium to < 2300mg  per day, should limit further is any chronic conditions such as high blood pressure, heart disease, diabetes, etc. The American Heart Association advised that < 1500mg  is is ideal -try to avoid foods/beverages that contain any ingredients with names you do not recognize  -try to avoid foods/beverages  with added sugar or sweeteners/sweets  -try to avoid sweet drinks (including diet drinks): soda, juice, Gatorade, sweet tea, power drinks, diet drinks -try to avoid white rice, white bread, pasta (unless whole grain)  EXERCISE GUIDELINES FOR ADULTS: -if you wish to increase your physical activity, do so gradually and with the approval of your doctor -STOP and seek medical care immediately if you have any chest pain, chest discomfort or trouble breathing when starting or increasing exercise  -move and stretch your body, legs, feet and arms when sitting for long periods -Physical activity guidelines for optimal health in adults: -get at least 150 minutes per week of moderate exercise (can talk, but not sing); this is about 20-30 minutes of  sustained activity 5-7 days per week or two 10-15 minute episodes of sustained activity 5-7 days per week -do some  muscle building/resistance training/strength training at least 2 days per week  -balance exercises 3+ days per week:   Stand somewhere where you have something sturdy to hold onto if you lose balance    1) lift up on toes, then back down, start with 5x per day and work up to 20x   2) stand and lift one leg straight out to the side so that foot is a few inches of the floor, start with 5x each side and work up to 20x each side   3) stand on one foot, start with 5 seconds each side and work up to 20 seconds on each side  If you need ideas or help with getting more active:  -Silver sneakers https://tools.silversneakers.com  -Walk with a Doc: Http://www.duncan-williams.com/  -try to include resistance (weight lifting/strength building) and balance exercises twice per week: or the following link for ideas: http://castillo-powell.com/  buyducts.dk  STRESS MANAGEMENT: -can try meditating, or just sitting quietly with deep breathing while intentionally relaxing all parts of your body for 5 minutes daily -if you need further help with stress, anxiety or depression please follow up with your primary doctor or contact the wonderful folks at [facility]: [phone]  SOCIAL CONNECTIONS: -options in [place] if you wish to engage in more social and exercise related activities:  -Silver sneakers https://tools.silversneakers.com  -Walk with a Doc: Http://www.duncan-williams.com/  -Check out the [facility] Active Adults 50+ section on the [place] [employer] companies (hiking clubs, book clubs, cards and games, chess, exercise classes, aquatic classes and much more) - see the website for details: https://www.[place].gov/departments/parks-recreation/active-adults50  -YouTube has lots of exercise videos for different ages and abilities as well  -[facility] (a variety of indoor and outdoor  inperson activities for adults). 3136307785. [address]  -Virtual Online Classes (a variety of topics): see seniorplanet.org or call [phone]  -consider volunteering at a school, hospice center, church, senior center or elsewhere

## 2024-04-18 NOTE — Progress Notes (Signed)
 PATIENT CHECK-IN and HEALTH RISK ASSESSMENT QUESTIONNAIRE:  -completed by phone/video for Medicare Preventive Visit  1)Vitals (height, wt, BP, etc) - record in vitals section for visit on day of visit Request home vitals (wt, BP, etc.) and enter into vitals, THEN update Vital Signs SmartPhrase below at the top of the HPI. See below.  2)Reviewed and Update Medications, Allergies PMH, Surgeries, Social history in Epic 3)Hospitalizations in the last year with date/reason? none  4)Reviewed  and Updated Care Team (patient's specialists) in Epic 5) Completed PHQ9 in Epic  6) Completed Fall Screening in Epic 7)Review all Health Maintenance Due and order if not done. 8) Under the Medicare Tab completed the following: - Medicare wellness tab -Stress tab -PHQ-9, Exercise Tab -Social Connections Tab -Method of visit.     ----------------------------------------------------------------------------------------------------------------------------------------------------------------------------------------------------------------------  Because this visit was a virtual/telehealth visit, some criteria may be missing or patient reported. Any vitals not documented were not able to be obtained and vitals that have been documented are patient reported.    MEDICARE ANNUAL PREVENTIVE VISIT WITH PROVIDER: (Welcome to Medicare, initial annual wellness or annual wellness exam)  Virtual Visit via Phone Note  I connected with ADALENA ABDULLA on 04/18/24 by phone and verified that I am speaking with the correct person using two identifiers.  Location patient: home Location provider:work or home office Persons participating in the virtual visit: patient, provider  Concerns and/or follow up today: Reports doing well. See intake detailed in flowsheets.  How often do you have a drink containing alcohol?never How many drinks containing alcohol do you have on a typical day when you are drinking?na How often do  you have six or more drinks on one occasion?na Have you ever smoked?n Quit date if applicable? na  How many packs a day do/did you smoke? na Do you use smokeless tobacco?n Do you use an illicit drugs?n Do you feel safe at home?y Last dentist visit? Goes on a regular basis - went a few weeks ago   Last eye Exam and location?gets eye check at least year  See HM section in Epic for other details of completed HM.    ROS: negative for report of fevers, unintentional weight loss, vision changes, vision loss, hearing loss or change, chest pain, sob, hemoptysis, melena, hematochezia, hematuria, falls, bleeding or bruising, thoughts of suicide or self harm, memory loss  Patient-completed extensive health risk assessment - reviewed and discussed with the patient: See Health Risk Assessment completed with patient prior to the visit either above or in recent phone note. This was reviewed in detailed with the patient today and appropriate recommendations, orders and referrals were placed as needed per Summary below and patient instructions.   Review of Medical History: -PMH, PSH, Family History and current specialty and care providers reviewed and updated and listed below   Patient Care Team: Theophilus Andrews, Tully GRADE, MD as PCP - General (Internal Medicine) Frutoso Luz, MD as Referring Physician (Allergy) Sheril Coy, MD as Consulting Physician (Orthopedic Surgery)   Past Medical History:  Diagnosis Date   Allergy    Anemia 2016   Anxiety    Asthma    Chronic headaches    Chronic sinusitis    Depression    Fibromyalgia    GERD (gastroesophageal reflux disease)    IBS (irritable bowel syndrome)    Meckel diverticulum 1997   Meckel's diverticulum    Migraines 03/22/2020   Neuromuscular disorder (HCC)    fibromyagia   Osteopenia     Past  Surgical History:  Procedure Laterality Date   ABDOMINAL HYSTERECTOMY     tubes/ovaries spared   APPENDECTOMY  1997   BREAST ENHANCEMENT  SURGERY     CESAREAN SECTION  1979, 1983   x 2   HEMORRHOID BANDING     LAPAROSCOPIC ABDOMINAL EXPLORATION     peritonitis, appendectomy    Social History   Socioeconomic History   Marital status: Married    Spouse name: Not on file   Number of children: 2   Years of education: Not on file   Highest education level: Not on file  Occupational History   Occupation: school teacher/disabled  Tobacco Use   Smoking status: Never   Smokeless tobacco: Never  Vaping Use   Vaping status: Never Used  Substance and Sexual Activity   Alcohol use: No    Alcohol/week: 0.0 standard drinks of alcohol   Drug use: No   Sexual activity: Not Currently    Birth control/protection: Post-menopausal  Other Topics Concern   Not on file  Social History Narrative   Not on file   Social Drivers of Health   Financial Resource Strain: Not on file  Food Insecurity: Not on file  Transportation Needs: Not on file  Physical Activity: Sufficiently Active (04/18/2024)   Exercise Vital Sign    Days of Exercise per Week: 5 days    Minutes of Exercise per Session: 60 min  Stress: No Stress Concern Present (04/18/2024)   [employer] of Occupational Health - Occupational Stress Questionnaire    Feeling of Stress: Not at all  Social Connections: Moderately Integrated (04/18/2024)   Social Connection and Isolation Panel    Frequency of Communication with Friends and Family: More than three times a week    Frequency of Social Gatherings with Friends and Family: Three times a week    Attends Religious Services: Never    Active Member of Clubs or Organizations: Yes    Attends Engineer, Structural: More than 4 times per year    Marital Status: Married  Catering Manager Violence: Not on file    Family History  Problem Relation Age of Onset   Diabetes Father    Hypertension Father    Diabetes Brother    COPD Brother    Asthma Brother    Hyperlipidemia Brother    Hypertension Mother     Diverticulitis Maternal Grandmother    Rheum arthritis Sister    Colon cancer Neg Hx     Current Outpatient Medications on File Prior to Visit  Medication Sig Dispense Refill   bimatoprost  (LATISSE ) 0.03 % ophthalmic solution PLACE 1 DROP ON APPLICATOR AND APPLY EVENLY ALONG SKIN OF UPPLER EYELID AT BASE OF EYELASHES ONCE AT BEDTIME 5 mL 99   Cholecalciferol (VITAMIN D3) 125 MCG (5000 UT) CAPS Take 1 cap 4-5 days per week     cycloSPORINE (RESTASIS) 0.05 % ophthalmic emulsion Place 1 drop into both eyes 2 (two) times daily.     eletriptan  (RELPAX ) 40 MG tablet Take 1 tablet for Migraine & may repeat 1 x in 2 hours (Maximum 2 tabs /24 hours) 6 tablet 5   estradiol  (VIVELLE -DOT) 0.025 MG/24HR PLACE 1 PATCH ONTO THE SKIN 2 TIMES A WEEK. 24 patch 3   Fluorouracil (TOLAK EX) Apply topically.     LINZESS  290 MCG CAPS capsule TAKE 1 CAPSULE BY MOUTH EVERY DAY FOR IBS 90 capsule 3   Milnacipran  HCl (SAVELLA ) 100 MG TABS tablet Take  2 tablets ( 200 mg)  Daily  for  Mood 180 tablet 3   ondansetron  (ZOFRAN ) 8 MG tablet Take1 tablet 2 to 3 x /day as needed for Nausea / Emesis 100 tablet 0   OVER THE COUNTER MEDICATION Apple Cider Vinegar-Take daily     OVER THE COUNTER MEDICATION 500 mg. Vitamin C daily.     OVER THE COUNTER MEDICATION Cinnamon daily     OVER THE COUNTER MEDICATION Zinc 50 mg daily     Sennosides (SENOKOT PO) Take 1 tablet by mouth as needed.      traZODone  (DESYREL ) 50 MG tablet TAKE 1 TO 3 TABS 1 TO 2 HOURS BEFORE BEDTIME AS NEEDED FOR SLEEP 270 tablet 1   triamterene -hydrochlorothiazide (MAXZIDE) 75-50 MG tablet TAKE 1 TABLET BY MOUTH EVERY DAY FOR BP AND FLUID RETENTION 90 tablet 3   amphetamine -dextroamphetamine (ADDERALL XR) 15 MG 24 hr capsule Take one capsule (15 mg) two times a day. (Patient not taking: Reported on 04/18/2024) 60 capsule 0   dexamethasone  (DECADRON ) 1 MG tablet Take 1 tablet  3 x /day as directed for pain  / Inflammation. (Patient not taking: Reported on  04/18/2024) 50 tablet 0   Efinaconazole (JUBLIA) 10 % SOLN Apply topically daily. (Patient not taking: Reported on 04/18/2024)     meloxicam  (MOBIC ) 15 MG tablet Take 1/2 to 1 tablet Daily with Food  for Pain & Inflammation (Patient not taking: Reported on 04/18/2024) 30 tablet 0   No current facility-administered medications on file prior to visit.    Allergies  Allergen Reactions   Hydrocodone-Acetaminophen Shortness Of Breath   Lamisil  [Terbinafine ] Rash   Codeine Hives   Morphine And Codeine Hives   Nitrofurantoin Other (See Comments)    Makes hands and feet burn   Tetracycline        Physical Exam Vitals requested from patient and listed below if patient had equipment and was able to obtain at home for this virtual visit: There were no vitals filed for this visit. Estimated body mass index is 20.69 kg/m as calculated from the following:   Height as of 10/07/23: 4' 11.5 (1.511 m).   Weight as of 10/07/23: 104 lb 3.2 oz (47.3 kg).  EKG (optional): deferred due to virtual visit  GENERAL: alert, oriented, no acute distress detected, full vision exam deferred due to pandemic and/or virtual encounter  PSYCH/NEURO: pleasant and cooperative, no obvious depression or anxiety, speech and thought processing grossly intact, Cognitive function grossly intact  Flowsheet Row Clinical Support from 04/18/2024 in [facility]  PHQ-9 Total Score 6        04/18/2024    2:46 PM 01/20/2023   12:29 AM 05/27/2021    4:00 PM 10/06/2020    4:53 PM 03/22/2020    9:59 AM  Depression screen PHQ 2/9  Decreased Interest 2 1 0 0 2  Down, Depressed, Hopeless 2 1 1  0 2  PHQ - 2 Score 4 2 1  0 4  Altered sleeping 0    0  Tired, decreased energy 2    0  Change in appetite 0    0  Feeling bad or failure about yourself  0    0  Trouble concentrating 0    0  Moving slowly or fidgety/restless 0    0  Suicidal thoughts 0    0  PHQ-9 Score 6    4  Difficult doing work/chores      Somewhat difficult       10/06/2020  4:53 PM 12/27/2020   11:10 AM 01/20/2023   12:29 AM 04/18/2024    2:46 PM 04/18/2024    2:51 PM  Fall Risk  Falls in the past year? 0 0 0 0 0  Was there an injury with Fall?  0  0 0  Fall Risk Category Calculator  0  0 0  Fall Risk Category (Retired)  Low      (RETIRED) Patient Fall Risk Level  Low fall risk      Patient at Risk for Falls Due to No Fall Risks No Fall Risks No Fall Risks No Fall Risks No Fall Risks  Fall risk Follow up Education provided;Falls prevention discussed;Falls evaluation completed  Falls prevention discussed;Falls evaluation completed  Falls prevention discussed;Education provided;Falls evaluation completed Falls evaluation completed Falls evaluation completed     Data saved with a previous flowsheet row definition     SUMMARY AND PLAN:  Encounter for Medicare annual wellness exam  Discussed applicable health maintenance/preventive health measures and advised and referred or ordered per patient preferences: -reports had reaction to the mrna covid vaccine, discussed alternative option -discussed vaccines due recs and risks and where she can get, advise top let us  know if she does so that we can update record Health Maintenance  Topic Date Due   COVID-19 Vaccine (4 - 2025-26 season) 05/04/2024 (Originally 02/14/2024)   Zoster Vaccines- Shingrix (1 of 2) 07/19/2024 (Originally 06/04/1970)   Influenza Vaccine  09/12/2024 (Originally 01/14/2024)   DTaP/Tdap/Td (3 - Td or Tdap) 04/18/2025 (Originally 02/25/2021)   Mammogram  02/21/2025   Medicare Annual Wellness (AWV)  04/18/2025   Colonoscopy  04/29/2026   Pneumococcal Vaccine: 50+ Years  Completed   DEXA SCAN  Completed   Hepatitis C Screening  Completed   Meningococcal B Vaccine  Aged Out      Education and counseling on the following was provided based on the above review of health and a plan/checklist for the patient, along with additional information discussed,  was provided for the patient in the patient instructions :    -Advised and counseled on a healthy lifestyle - including the importance of a healthy diet, regular physical activity, social connections and stress management. She stresses some about recent rescue dog and adult son who is on disability. She has had counseling and feels no longer needs and is doing ok.  -Reviewed patient's current diet. Advised and counseled on a whole foods based healthy diet. Focus on adequate veggies and healthy proteins and eliminating processed foods/drink such as diet soda.  A summary of a healthy diet was provided in the Patient Instructions.  -reviewed patient's current physical activity level and discussed exercise guidelines for adults. She is quite active. Further resources provided in patient instructions.  -Advise yearly dental visits at minimum and regular eye exams   Follow up: see patient instructions     Patient Instructions  I really enjoyed getting to talk with you today! I am available on Tuesdays and Thursdays for virtual visits if you have any questions or concerns, or if I can be of any further assistance.   CHECKLIST FROM ANNUAL WELLNESS VISIT:  -Follow up (please call to schedule if not scheduled after visit):   -yearly for annual wellness visit with primary care office  Here is a list of your preventive care/health maintenance measures and the plan for each if any are due:  PLAN For any measures below that may be due:    1. Can get vaccines at the  pharmacy. Please let us  know if you do so that we can update your record.   Health Maintenance  Topic Date Due   COVID-19 Vaccine (4 - 2025-26 season) 05/04/2024 (Originally 02/14/2024)   Zoster Vaccines- Shingrix (1 of 2) 07/19/2024 (Originally 06/04/1970)   Influenza Vaccine  09/12/2024 (Originally 01/14/2024)   DTaP/Tdap/Td (3 - Td or Tdap) 04/18/2025 (Originally 02/25/2021)   Mammogram  02/21/2025   Medicare Annual Wellness (AWV)   04/18/2025   Colonoscopy  04/29/2026   Pneumococcal Vaccine: 50+ Years  Completed   DEXA SCAN  Completed   Hepatitis C Screening  Completed   Meningococcal B Vaccine  Aged Out    -See a dentist at least yearly  -Get your eyes checked and then per your eye specialist's recommendations  -Other issues addressed today:   -I have included below further information regarding a healthy whole foods based diet, physical activity guidelines for adults, stress management and opportunities for social connections. I hope you find this information useful.   -----------------------------------------------------------------------------------------------------------------------------------------------------------------------------------------------------------------------------------------------------------    NUTRITION: -eat real food: lots of colorful vegetables (half the plate) and fruits -5-7 servings of vegetables and fruits per day (fresh or steamed is best), exp. 2 servings of vegetables with lunch and dinner and 2 servings of fruit per day. Berries and greens such as kale and collards are great choices.  -consume on a regular basis:  fresh fruits, fresh veggies, fish, nuts, seeds, healthy oils (such as olive oil, avocado oil), whole grains (make sure for bread/pasta/crackers/etc., that the first ingredient on label contains the word whole), legumes. -can eat small amounts of dairy and lean meat (no larger than the palm of your hand), but avoid processed meats such as ham, bacon, lunch meat, etc. -drink water -try to avoid fast food and pre-packaged foods, processed meat, ultra processed foods/beverages (donuts, candy, etc.) -most experts advise limiting sodium to < 2300mg  per day, should limit further is any chronic conditions such as high blood pressure, heart disease, diabetes, etc. The American Heart Association advised that < 1500mg  is is ideal -try to avoid foods/beverages that contain any  ingredients with names you do not recognize  -try to avoid foods/beverages  with added sugar or sweeteners/sweets  -try to avoid sweet drinks (including diet drinks): soda, juice, Gatorade, sweet tea, power drinks, diet drinks -try to avoid white rice, white bread, pasta (unless whole grain)  EXERCISE GUIDELINES FOR ADULTS: -if you wish to increase your physical activity, do so gradually and with the approval of your doctor -STOP and seek medical care immediately if you have any chest pain, chest discomfort or trouble breathing when starting or increasing exercise  -move and stretch your body, legs, feet and arms when sitting for long periods -Physical activity guidelines for optimal health in adults: -get at least 150 minutes per week of moderate exercise (can talk, but not sing); this is about 20-30 minutes of sustained activity 5-7 days per week or two 10-15 minute episodes of sustained activity 5-7 days per week -do some muscle building/resistance training/strength training at least 2 days per week  -balance exercises 3+ days per week:   Stand somewhere where you have something sturdy to hold onto if you lose balance    1) lift up on toes, then back down, start with 5x per day and work up to 20x   2) stand and lift one leg straight out to the side so that foot is a few inches of the floor, start with 5x each side and  work up to 20x each side   3) stand on one foot, start with 5 seconds each side and work up to 20 seconds on each side  If you need ideas or help with getting more active:  -Silver sneakers https://tools.silversneakers.com  -Walk with a Doc: Http://www.duncan-williams.com/  -try to include resistance (weight lifting/strength building) and balance exercises twice per week: or the following link for ideas: http://castillo-powell.com/  buyducts.dk  STRESS MANAGEMENT: -can try meditating,  or just sitting quietly with deep breathing while intentionally relaxing all parts of your body for 5 minutes daily -if you need further help with stress, anxiety or depression please follow up with your primary doctor or contact the wonderful folks at [facility]: [phone]  SOCIAL CONNECTIONS: -options in [place] if you wish to engage in more social and exercise related activities:  -Silver sneakers https://tools.silversneakers.com  -Walk with a Doc: Http://www.duncan-williams.com/  -Check out the [facility] Active Adults 50+ section on the [place] of [employer] companies (hiking clubs, book clubs, cards and games, chess, exercise classes, aquatic classes and much more) - see the website for details: https://www.[place].gov/departments/parks-recreation/active-adults50  -YouTube has lots of exercise videos for different ages and abilities as well  -[facility] (a variety of indoor and outdoor inperson activities for adults). [phone]. [address]  -Virtual Online Classes (a variety of topics): see seniorplanet.org or call [phone]  -consider volunteering at a school, hospice center, church, senior center or elsewhere            Chiquita JONELLE Cramp, DO

## 2024-04-25 ENCOUNTER — Encounter: Payer: Self-pay | Admitting: Internal Medicine

## 2024-04-25 ENCOUNTER — Ambulatory Visit: Admitting: Internal Medicine

## 2024-04-25 ENCOUNTER — Telehealth: Payer: Self-pay | Admitting: *Deleted

## 2024-04-25 VITALS — BP 110/80 | HR 82 | Temp 97.5°F | Ht 60.5 in | Wt 109.2 lb

## 2024-04-25 DIAGNOSIS — Z23 Encounter for immunization: Secondary | ICD-10-CM

## 2024-04-25 DIAGNOSIS — Z0000 Encounter for general adult medical examination without abnormal findings: Secondary | ICD-10-CM

## 2024-04-25 DIAGNOSIS — E559 Vitamin D deficiency, unspecified: Secondary | ICD-10-CM | POA: Diagnosis not present

## 2024-04-25 DIAGNOSIS — E782 Mixed hyperlipidemia: Secondary | ICD-10-CM

## 2024-04-25 DIAGNOSIS — R0989 Other specified symptoms and signs involving the circulatory and respiratory systems: Secondary | ICD-10-CM | POA: Diagnosis not present

## 2024-04-25 DIAGNOSIS — M8580 Other specified disorders of bone density and structure, unspecified site: Secondary | ICD-10-CM

## 2024-04-25 LAB — LIPID PANEL
Cholesterol: 187 mg/dL (ref 0–200)
HDL: 96.5 mg/dL (ref >39.00)
LDL Cholesterol: 76 mg/dL (ref 0–99)
NonHDL: 90.48
Total CHOL/HDL Ratio: 2
Triglycerides: 74 mg/dL (ref 0.0–149.0)
VLDL: 14.8 mg/dL (ref 0.0–40.0)

## 2024-04-25 LAB — COMPREHENSIVE METABOLIC PANEL WITH GFR
ALT: 22 U/L (ref 0–35)
AST: 24 U/L (ref 0–37)
Albumin: 4.5 g/dL (ref 3.5–5.2)
Alkaline Phosphatase: 56 U/L (ref 39–117)
BUN: 18 mg/dL (ref 6–23)
CO2: 33 meq/L — ABNORMAL HIGH (ref 19–32)
Calcium: 9.4 mg/dL (ref 8.4–10.5)
Chloride: 95 meq/L — ABNORMAL LOW (ref 96–112)
Creatinine, Ser: 0.8 mg/dL (ref 0.40–1.20)
GFR: 73.37 mL/min (ref >60.00)
Glucose, Bld: 92 mg/dL (ref 70–99)
Potassium: 3.2 meq/L — ABNORMAL LOW (ref 3.5–5.1)
Sodium: 137 meq/L (ref 135–145)
Total Bilirubin: 0.9 mg/dL (ref 0.2–1.2)
Total Protein: 7.1 g/dL (ref 6.0–8.3)

## 2024-04-25 LAB — CBC WITH DIFFERENTIAL/PLATELET
Basophils Absolute: 0.1 K/uL (ref 0.0–0.1)
Basophils Relative: 1.1 % (ref 0.0–3.0)
Eosinophils Absolute: 0.1 K/uL (ref 0.0–0.7)
Eosinophils Relative: 2.5 % (ref 0.0–5.0)
HCT: 42.8 % (ref 36.0–46.0)
Hemoglobin: 14.5 g/dL (ref 12.0–15.0)
Lymphocytes Relative: 34.7 % (ref 12.0–46.0)
Lymphs Abs: 1.8 K/uL (ref 0.7–4.0)
MCHC: 33.9 g/dL (ref 30.0–36.0)
MCV: 96 fl (ref 78.0–100.0)
Monocytes Absolute: 0.5 K/uL (ref 0.1–1.0)
Monocytes Relative: 9.1 % (ref 3.0–12.0)
Neutro Abs: 2.8 K/uL (ref 1.4–7.7)
Neutrophils Relative %: 52.6 % (ref 43.0–77.0)
Platelets: 257 K/uL (ref 150.0–400.0)
RBC: 4.46 Mil/uL (ref 3.87–5.11)
RDW: 13.1 % (ref 11.5–15.5)
WBC: 5.3 K/uL (ref 4.0–10.5)

## 2024-04-25 LAB — TSH: TSH: 2.26 u[IU]/mL (ref 0.35–5.50)

## 2024-04-25 LAB — VITAMIN D 25 HYDROXY (VIT D DEFICIENCY, FRACTURES): VITD: 107.83 ng/mL (ref 30.00–100.00)

## 2024-04-25 LAB — VITAMIN B12: Vitamin B-12: 551 pg/mL (ref 211–911)

## 2024-04-25 MED ORDER — ESTRADIOL 0.025 MG/24HR TD PTTW: 1.0000 | MEDICATED_PATCH | TRANSDERMAL | 3 refills | Status: AC

## 2024-04-25 NOTE — Addendum Note (Signed)
 Addended by: KATHRYNE MILLMAN B on: 04/25/2024 02:52 PM   Modules accepted: Orders

## 2024-04-25 NOTE — Telephone Encounter (Signed)
 CRITICAL VALUE STICKER  CRITICAL VALUE: Vit D 107.83  RECEIVER (on-site recipient of call): Vernell CMA  DATE & TIME NOTIFIED: 04/25/24 3:06 pm  MESSENGER (representative from lab): Saa  MD NOTIFIED: Theophilus MD  TIME OF NOTIFICATION:  RESPONSE:

## 2024-04-25 NOTE — Progress Notes (Signed)
 Established Patient Office Visit     CC/Reason for Visit: Annual preventive exam  HPI: Alexis Weaver is a 73 y.o. female who is coming in today for the above mentioned reasons. Past Medical History is significant for: Labile hypertension not on current medication, migraine headaches, impaired glucose tolerance, vitamin D  deficiency, depression and anxiety.  Has routine eye and dental care.  Is due for flu, COVID tetanus and shingles vaccines.  Cancer screening is up-to-date, DEXA scan is up-to-date.   Past Medical/Surgical History: Past Medical History:  Diagnosis Date   Allergy    Anemia 2016   Anxiety    Asthma    Chronic headaches    Chronic sinusitis    Depression    Fibromyalgia    GERD (gastroesophageal reflux disease)    IBS (irritable bowel syndrome)    Meckel diverticulum 1997   Meckel's diverticulum    Migraines 03/22/2020   Neuromuscular disorder (HCC)    fibromyagia   Osteopenia     Past Surgical History:  Procedure Laterality Date   ABDOMINAL HYSTERECTOMY     tubes/ovaries spared   APPENDECTOMY  1997   BREAST ENHANCEMENT SURGERY     CESAREAN SECTION  1979, 1983   x 2   HEMORRHOID BANDING     LAPAROSCOPIC ABDOMINAL EXPLORATION     peritonitis, appendectomy    Social History:  reports that she has never smoked. She has never used smokeless tobacco. She reports that she does not drink alcohol and does not use drugs.  Allergies: Allergies  Allergen Reactions   Hydrocodone-Acetaminophen Shortness Of Breath   Lamisil  [Terbinafine ] Rash   Codeine Hives   Morphine And Codeine Hives   Nitrofurantoin Other (See Comments)    Makes hands and feet burn   Tetracycline     Family History:  Family History  Problem Relation Age of Onset   Diabetes Father    Hypertension Father    Diabetes Brother    COPD Brother    Asthma Brother    Hyperlipidemia Brother    Hypertension Mother    Diverticulitis Maternal Grandmother    Rheum arthritis Sister     Colon cancer Neg Hx      Current Outpatient Medications:    bimatoprost  (LATISSE ) 0.03 % ophthalmic solution, PLACE 1 DROP ON APPLICATOR AND APPLY EVENLY ALONG SKIN OF UPPLER EYELID AT BASE OF EYELASHES ONCE AT BEDTIME, Disp: 5 mL, Rfl: 99   Cholecalciferol (VITAMIN D3) 125 MCG (5000 UT) CAPS, Take 1 cap 4-5 days per week, Disp: , Rfl:    cycloSPORINE (RESTASIS) 0.05 % ophthalmic emulsion, Place 1 drop into both eyes 2 (two) times daily., Disp: , Rfl:    eletriptan  (RELPAX ) 40 MG tablet, Take 1 tablet for Migraine & may repeat 1 x in 2 hours (Maximum 2 tabs /24 hours), Disp: 6 tablet, Rfl: 5   erythromycin ophthalmic ointment, 1 Application at bedtime., Disp: , Rfl:    Fluorouracil (TOLAK EX), Apply topically., Disp: , Rfl:    LINZESS  290 MCG CAPS capsule, TAKE 1 CAPSULE BY MOUTH EVERY DAY FOR IBS, Disp: 90 capsule, Rfl: 3   meloxicam  (MOBIC ) 15 MG tablet, Take 1/2 to 1 tablet Daily with Food  for Pain & Inflammation, Disp: 30 tablet, Rfl: 0   Milnacipran  HCl (SAVELLA ) 100 MG TABS tablet, Take  2 tablets ( 200 mg) Daily  for  Mood, Disp: 180 tablet, Rfl: 3   ondansetron  (ZOFRAN ) 8 MG tablet, Take1 tablet 2 to 3 x /  day as needed for Nausea / Emesis, Disp: 100 tablet, Rfl: 0   OVER THE COUNTER MEDICATION, Apple Cider Vinegar-Take daily, Disp: , Rfl:    OVER THE COUNTER MEDICATION, 500 mg. Vitamin C daily., Disp: , Rfl:    OVER THE COUNTER MEDICATION, Cinnamon daily, Disp: , Rfl:    OVER THE COUNTER MEDICATION, Zinc 50 mg daily, Disp: , Rfl:    Sennosides (SENOKOT PO), Take 1 tablet by mouth as needed. , Disp: , Rfl:    traZODone  (DESYREL ) 50 MG tablet, TAKE 1 TO 3 TABS 1 TO 2 HOURS BEFORE BEDTIME AS NEEDED FOR SLEEP, Disp: 270 tablet, Rfl: 1   triamterene -hydrochlorothiazide (MAXZIDE) 75-50 MG tablet, TAKE 1 TABLET BY MOUTH EVERY DAY FOR BP AND FLUID RETENTION, Disp: 90 tablet, Rfl: 3   [START ON 04/27/2024] estradiol  (VIVELLE -DOT) 0.025 MG/24HR, Place 1 patch onto the skin 2 (two) times a  week., Disp: 24 patch, Rfl: 3  Review of Systems:  Negative unless indicated in HPI.   Physical Exam: Vitals:   04/25/24 0937  BP: 110/80  Pulse: 82  Temp: (!) 97.5 F (36.4 C)  TempSrc: Oral  SpO2: 98%  Weight: 109 lb 3.2 oz (49.5 kg)  Height: 5' 0.5 (1.537 m)    Body mass index is 20.98 kg/m.   Physical Exam Vitals reviewed.  Constitutional:      General: She is not in acute distress.    Appearance: Normal appearance. She is not ill-appearing, toxic-appearing or diaphoretic.  HENT:     Head: Normocephalic.     Right Ear: Tympanic membrane, ear canal and external ear normal. There is no impacted cerumen.     Left Ear: Tympanic membrane, ear canal and external ear normal. There is no impacted cerumen.     Nose: Nose normal.     Mouth/Throat:     Mouth: Mucous membranes are moist.     Pharynx: Oropharynx is clear. No oropharyngeal exudate or posterior oropharyngeal erythema.  Eyes:     General: No scleral icterus.       Right eye: No discharge.        Left eye: No discharge.     Conjunctiva/sclera: Conjunctivae normal.     Pupils: Pupils are equal, round, and reactive to light.  Neck:     Vascular: No carotid bruit.  Cardiovascular:     Rate and Rhythm: Normal rate and regular rhythm.     Pulses: Normal pulses.     Heart sounds: Normal heart sounds.  Pulmonary:     Effort: Pulmonary effort is normal. No respiratory distress.     Breath sounds: Normal breath sounds.  Abdominal:     General: Abdomen is flat. Bowel sounds are normal.     Palpations: Abdomen is soft.  Musculoskeletal:        General: Normal range of motion.     Cervical back: Normal range of motion.  Skin:    General: Skin is warm and dry.  Neurological:     General: No focal deficit present.     Mental Status: She is alert and oriented to person, place, and time. Mental status is at baseline.  Psychiatric:        Mood and Affect: Mood normal.        Behavior: Behavior normal.         Thought Content: Thought content normal.        Judgment: Judgment normal.      Impression and Plan:  Encounter for preventive health  examination  Labile hypertension -     CBC with Differential/Platelet; Future -     Comprehensive metabolic panel with GFR; Future -     TSH; Future -     Vitamin B12; Future  Hyperlipidemia, mixed -     Lipid panel; Future  Immunization due  Vitamin D  deficiency -     VITAMIN D  25 Hydroxy (Vit-D Deficiency, Fractures); Future  Osteopenia, unspecified location -     Estradiol ; Place 1 patch onto the skin 2 (two) times a week.  Dispense: 24 patch; Refill: 3   -Recommend routine eye and dental care. -Healthy lifestyle discussed in detail. -Labs to be updated today. -Prostate cancer screening: Not applicable Health Maintenance  Topic Date Due   COVID-19 Vaccine (4 - 2025-26 season) 05/04/2024*   Zoster (Shingles) Vaccine (1 of 2) 07/19/2024*   Flu Shot  09/12/2024*   DTaP/Tdap/Td vaccine (3 - Td or Tdap) 04/18/2025*   Breast Cancer Screening  02/21/2025   Medicare Annual Wellness Visit  04/18/2025   Colon Cancer Screening  04/29/2026   Pneumococcal Vaccine for age over 15  Completed   DEXA scan (bone density measurement)  Completed   Hepatitis C Screening  Completed   Meningitis B Vaccine  Aged Out  *Topic was postponed. The date shown is not the original due date.    - Flu vaccine in office today. - Advised to update Tdap and shingles at pharmacy.     Tully Theophilus Andrews, MD [facility]

## 2024-04-26 ENCOUNTER — Ambulatory Visit: Payer: Self-pay | Admitting: Internal Medicine

## 2024-04-26 DIAGNOSIS — E876 Hypokalemia: Secondary | ICD-10-CM

## 2024-04-26 DIAGNOSIS — E559 Vitamin D deficiency, unspecified: Secondary | ICD-10-CM

## 2024-04-27 MED ORDER — POTASSIUM CHLORIDE CRYS ER 20 MEQ PO TBCR: EXTENDED_RELEASE_TABLET | ORAL | 0 refills | Status: AC

## 2024-04-28 ENCOUNTER — Ambulatory Visit: Payer: Medicare Other | Admitting: Nurse Practitioner

## 2024-05-15 ENCOUNTER — Other Ambulatory Visit

## 2024-05-15 ENCOUNTER — Other Ambulatory Visit: Payer: Self-pay | Admitting: Internal Medicine

## 2024-05-15 DIAGNOSIS — E876 Hypokalemia: Secondary | ICD-10-CM | POA: Diagnosis not present

## 2024-05-15 DIAGNOSIS — E559 Vitamin D deficiency, unspecified: Secondary | ICD-10-CM

## 2024-05-15 LAB — BASIC METABOLIC PANEL WITH GFR
BUN: 13 mg/dL (ref 6–23)
CO2: 34 meq/L — ABNORMAL HIGH (ref 19–32)
Calcium: 9.1 mg/dL (ref 8.4–10.5)
Chloride: 102 meq/L (ref 96–112)
Creatinine, Ser: 0.81 mg/dL (ref 0.40–1.20)
GFR: 72.25 mL/min (ref >60.00)
Glucose, Bld: 121 mg/dL — ABNORMAL HIGH (ref 70–99)
Potassium: 3.8 meq/L (ref 3.5–5.1)
Sodium: 139 meq/L (ref 135–145)

## 2024-05-17 ENCOUNTER — Ambulatory Visit: Payer: Self-pay | Admitting: Internal Medicine

## 2024-06-26 ENCOUNTER — Encounter: Payer: Self-pay | Admitting: Internal Medicine

## 2024-06-26 ENCOUNTER — Other Ambulatory Visit: Payer: Self-pay | Admitting: Internal Medicine

## 2024-06-26 DIAGNOSIS — J45909 Unspecified asthma, uncomplicated: Secondary | ICD-10-CM

## 2024-07-21 ENCOUNTER — Other Ambulatory Visit: Payer: Self-pay | Admitting: Nurse Practitioner

## 2024-07-21 DIAGNOSIS — R0989 Other specified symptoms and signs involving the circulatory and respiratory systems: Secondary | ICD-10-CM
